# Patient Record
Sex: Male | Born: 1965 | Race: White | Hispanic: No | Marital: Married | Smoking: Former smoker
Health system: Southern US, Community
[De-identification: ages and names within clinical notes are randomized; demographics above are authoritative.]

## PROBLEM LIST (undated history)

## (undated) ENCOUNTER — Ambulatory Visit (HOSPITAL_BASED_OUTPATIENT_CLINIC_OR_DEPARTMENT_OTHER): Admission: EM | Source: Home / Self Care

## (undated) ENCOUNTER — Ambulatory Visit (HOSPITAL_BASED_OUTPATIENT_CLINIC_OR_DEPARTMENT_OTHER): Source: Home / Self Care

## (undated) DIAGNOSIS — K219 Gastro-esophageal reflux disease without esophagitis: Secondary | ICD-10-CM

## (undated) DIAGNOSIS — K269 Duodenal ulcer, unspecified as acute or chronic, without hemorrhage or perforation: Secondary | ICD-10-CM

## (undated) DIAGNOSIS — I82621 Acute embolism and thrombosis of deep veins of right upper extremity: Secondary | ICD-10-CM

## (undated) DIAGNOSIS — I10 Essential (primary) hypertension: Secondary | ICD-10-CM

## (undated) DIAGNOSIS — E785 Hyperlipidemia, unspecified: Secondary | ICD-10-CM

## (undated) HISTORY — DX: Duodenal ulcer, unspecified as acute or chronic, without hemorrhage or perforation: K26.9

## (undated) HISTORY — DX: Hyperlipidemia, unspecified: E78.5

## (undated) HISTORY — DX: Acute embolism and thrombosis of deep veins of right upper extremity: I82.621

## (undated) HISTORY — PX: OTHER SURGICAL HISTORY: SHX169

## (undated) HISTORY — DX: Gastro-esophageal reflux disease without esophagitis: K21.9

## (undated) HISTORY — DX: Essential (primary) hypertension: I10

---

## 2015-10-17 DIAGNOSIS — Z21 Asymptomatic human immunodeficiency virus [HIV] infection status: Secondary | ICD-10-CM | POA: Insufficient documentation

## 2015-10-17 HISTORY — DX: Asymptomatic human immunodeficiency virus (hiv) infection status: Z21

## 2015-10-25 DIAGNOSIS — B20 Human immunodeficiency virus [HIV] disease: Secondary | ICD-10-CM

## 2016-01-28 DIAGNOSIS — B20 Human immunodeficiency virus [HIV] disease: Secondary | ICD-10-CM | POA: Diagnosis not present

## 2016-04-03 DIAGNOSIS — B20 Human immunodeficiency virus [HIV] disease: Secondary | ICD-10-CM | POA: Diagnosis not present

## 2016-08-14 DIAGNOSIS — B20 Human immunodeficiency virus [HIV] disease: Secondary | ICD-10-CM | POA: Diagnosis not present

## 2017-08-26 DIAGNOSIS — B20 Human immunodeficiency virus [HIV] disease: Secondary | ICD-10-CM | POA: Diagnosis not present

## 2017-11-19 DIAGNOSIS — Z79899 Other long term (current) drug therapy: Secondary | ICD-10-CM | POA: Diagnosis not present

## 2017-12-07 ENCOUNTER — Other Ambulatory Visit: Payer: Self-pay | Admitting: Infectious Diseases

## 2018-05-26 DIAGNOSIS — I10 Essential (primary) hypertension: Secondary | ICD-10-CM | POA: Diagnosis not present

## 2018-05-26 DIAGNOSIS — Z79899 Other long term (current) drug therapy: Secondary | ICD-10-CM | POA: Diagnosis not present

## 2018-07-07 DIAGNOSIS — M5416 Radiculopathy, lumbar region: Secondary | ICD-10-CM | POA: Diagnosis not present

## 2018-07-07 DIAGNOSIS — M9903 Segmental and somatic dysfunction of lumbar region: Secondary | ICD-10-CM | POA: Diagnosis not present

## 2018-07-07 DIAGNOSIS — M9905 Segmental and somatic dysfunction of pelvic region: Secondary | ICD-10-CM | POA: Diagnosis not present

## 2018-07-08 DIAGNOSIS — M9905 Segmental and somatic dysfunction of pelvic region: Secondary | ICD-10-CM | POA: Diagnosis not present

## 2018-07-08 DIAGNOSIS — M5416 Radiculopathy, lumbar region: Secondary | ICD-10-CM | POA: Diagnosis not present

## 2018-07-08 DIAGNOSIS — M9903 Segmental and somatic dysfunction of lumbar region: Secondary | ICD-10-CM | POA: Diagnosis not present

## 2018-11-15 DIAGNOSIS — I10 Essential (primary) hypertension: Secondary | ICD-10-CM | POA: Diagnosis not present

## 2018-11-24 DIAGNOSIS — Z79899 Other long term (current) drug therapy: Secondary | ICD-10-CM

## 2018-11-24 DIAGNOSIS — B20 Human immunodeficiency virus [HIV] disease: Secondary | ICD-10-CM

## 2019-10-26 DIAGNOSIS — B20 Human immunodeficiency virus [HIV] disease: Secondary | ICD-10-CM

## 2020-07-09 DIAGNOSIS — E785 Hyperlipidemia, unspecified: Secondary | ICD-10-CM | POA: Insufficient documentation

## 2020-07-09 DIAGNOSIS — I10 Essential (primary) hypertension: Secondary | ICD-10-CM | POA: Insufficient documentation

## 2020-07-09 DIAGNOSIS — K219 Gastro-esophageal reflux disease without esophagitis: Secondary | ICD-10-CM | POA: Insufficient documentation

## 2020-07-09 NOTE — Progress Notes (Signed)
Cardiology Office Note:    Date:  07/10/2020   ID:  Jimmy [name], DOB 24-Apr-1966, MRN [id number]  PCP:  [name], MD  Cardiologist:  Norman Herrlich, MD   Referring MD: [name], MD  ASSESSMENT:    1. Shortness of breath   2. Essential hypertension   3. Hyperlipidemia, unspecified hyperlipidemia type   4. Chest pain of uncertain etiology    PLAN:    In order of problems listed above:  1. He presents with complaints predominantly of shortness of breath more suggestive of cardiac than pulmonary.  Elevate evaluation including proBNP level echocardiogram. 2. BP at target presently on no antihypertensive agent 3. Presently not treated 4. And increased cardiovascular risk HIV positive we will check a high-sensitivity troponin in the office today and expedite a cardiac CTA.  Next appointment 4 weeks   Medication Adjustments/Labs and Tests Ordered: Current medicines are reviewed at length with the patient today.  Concerns regarding medicines are outlined above.  No orders of the defined types were placed in this encounter.  No orders of the defined types were placed in this encounter.    Chief Complaint  Patient presents with  . Shortness of Breath  . Chest Pain    History of Present Illness:    Jimmy [name] is a 54 y.o. male who is being seen today for the evaluation of SOB at the request of [name], MD.  He had an EKG performed 06/21/2020 showing sinus rhythm first-degree heart block otherwise normal I independently reviewed  He has been HIV positive approximately 15 years.  He smoked for about 10 years 1 pack a day and stopped 10 years ago.  He has no history of lung disease no cough wheezing or sputum production.  He has no history of heart disease congenital rheumatic and had no murmurs a young man.  His risk factors for CAD include hyperlipidemia recent lipid profile showed cholesterol 210 LDL 146 triglycerides 128 HDL 38.  He has been short of  breath for 1 to 2 years but it is worsened and interferes with his life his exertional he has trouble walking through parking lots climbing stairs but also has orthopnea propping his head up at nighttime he has had swelling intermittently not today.  His family history is noteworthy for his mother having arrhythmia what sounds like valvular heart disease and a striking history of sudden cardiac death in his mother's family.  He also has been having chest pain that he calls just a nagging discomfort he says is there most of the time not particularly exertional not relieved with rest just a nagging discomfort in his left chest he is worried that he has CAD.  Its not severe and does not radiate no associated nausea vomiting or diaphoresis its not pleuritic in nature.  He has no history of chest trauma.  We discussed evaluation to decide if this is cardiac or pulmonary and I think a proBNP level be very helpful.  Echocardiogram regarding cardiomyopathy heart failure pulmonary artery hypertension.  He is concerned he has CAD he knows a friend who had a normal stress test and subsequent myocardial infarction I told him I share his concern and will do an expedited cardiac CTA.  I will see back in my office in 4 weeks or sooner and also draw a high-sensitivity troponin recheck his office EKG today. Past Medical History:  Diagnosis Date  . Acid reflux   . HIV positive (HCC) 10/17/2015  . Hyperlipidemia   .  Hypertension     Past Surgical History:  Procedure Laterality Date  . 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 BIKTARVY 50-200-25 MG TABS tablet Take 1 tablet by mouth daily.  . Omega-3 Fatty Acids (FISH OIL PO) Take 1 tablet by mouth daily.     Allergies:   Penicillins   Social History   Socioeconomic History  . Marital status: Married    Spouse name: Not on file  . Number of children: Not on file  . Years of  education: Not on file  . Highest education level: Not on file  Occupational History  . Not on file  Tobacco Use  . Smoking status: Former Smoker    Types: Cigarettes    Quit date: 10/2010    Years since quitting: 9.7  . Smokeless tobacco: Never Used  Vaping Use  . Vaping Use: Never used  Substance and Sexual Activity  . Alcohol use: Yes    Alcohol/week: 1.0 standard drink    Types: 1 Cans of beer per week    Comment: occasional beer with dinner  . Drug use: Never  . Sexual activity: Not on file  Other Topics Concern  . Not on file  Social History Narrative  . Not on file   Social Determinants of Health   Financial Resource Strain:   . Difficulty of Paying Living Expenses: Not on file  Food Insecurity:   . Worried About Programme researcher, broadcasting/film/video in the Last Year: Not on file  . Ran Out of Food in the Last Year: Not on file  Transportation Needs:   . Lack of Transportation (Medical): Not on file  . Lack of Transportation (Non-Medical): Not on file  Physical Activity:   . Days of Exercise per Week: Not on file  . Minutes of Exercise per Session: Not on file  Stress:   . Feeling of Stress : Not on file  Social Connections:   . Frequency of Communication with Friends and Family: Not on file  . Frequency of Social Gatherings with Friends and Family: Not on file  . Attends Religious Services: Not on file  . Active Member of Clubs or Organizations: Not on file  . Attends Banker Meetings: Not on file  . Marital Status: Not on file     Family History: The patient's family history includes Arrhythmia in his mother; Breast cancer in his sister; Heart attack in his maternal grandfather; Heart disease in his maternal grandfather; Liver cancer in his sister; Lung cancer in his mother.  ROS:   Review of Systems  Constitutional: Positive for malaise/fatigue.  HENT: Negative.   Eyes: Negative.   Cardiovascular: Positive for dyspnea on exertion and leg swelling.    Respiratory: Positive for shortness of breath.   Endocrine: Negative.   Hematologic/Lymphatic: Negative.   Skin: Negative.   Musculoskeletal: Positive for joint pain.  Gastrointestinal: Negative.   Genitourinary: Negative.   Neurological: Negative.   Psychiatric/Behavioral: Negative.    Please see the history of present illness.     All other systems reviewed and are negative.  EKGs/Labs/Other Studies Reviewed:    The following studies were reviewed today:   EKG:  EKG is  ordered today.  The ekg ordered today is personally reviewed and demonstrates sinus rhythm left axis deviation otherwise normal no ischemic changes   Physical Exam:    VS:  BP 126/74  Pulse 86   Ht 5\' 11"  (1.803 m)   Wt 208 lb 12.8 oz (94.7 kg)   SpO2 97%   BMI 29.12 kg/m     Wt Readings from Last 3 Encounters:  07/10/20 208 lb 12.8 oz (94.7 kg)     GEN:  Well nourished, well developed in no acute distress HEENT: Normal NECK: No JVD; No carotid bruits LYMPHATICS: No lymphadenopathy CARDIAC: RRR, no murmurs, rubs, gallops RESPIRATORY:  Clear to auscultation without rales, wheezing or rhonchi  ABDOMEN: Soft, non-tender, non-distended MUSCULOSKELETAL:  No edema; No deformity  SKIN: Warm and dry NEUROLOGIC:  Alert and oriented x 3 PSYCHIATRIC:  Normal affect     Signed, 07/12/20, MD  07/10/2020 4:18 PM    [facility]

## 2020-07-10 ENCOUNTER — Encounter: Payer: Self-pay | Admitting: Cardiology

## 2020-07-10 ENCOUNTER — Ambulatory Visit: Payer: 59 | Admitting: Cardiology

## 2020-07-10 ENCOUNTER — Other Ambulatory Visit: Payer: Self-pay

## 2020-07-10 VITALS — BP 126/74 | HR 86 | Ht 71.0 in | Wt 208.8 lb

## 2020-07-10 DIAGNOSIS — E785 Hyperlipidemia, unspecified: Secondary | ICD-10-CM | POA: Diagnosis not present

## 2020-07-10 DIAGNOSIS — I10 Essential (primary) hypertension: Secondary | ICD-10-CM

## 2020-07-10 DIAGNOSIS — R079 Chest pain, unspecified: Secondary | ICD-10-CM | POA: Diagnosis not present

## 2020-07-10 DIAGNOSIS — R0602 Shortness of breath: Secondary | ICD-10-CM | POA: Diagnosis not present

## 2020-07-10 MED ORDER — METOPROLOL TARTRATE 100 MG PO TABS: 100.0000 mg | ORAL_TABLET | Freq: Once | ORAL | 0 refills | Status: DC

## 2020-07-10 NOTE — Patient Instructions (Signed)
Medication Instructions:  Your physician recommends that you continue on your current medications as directed. Please refer to the Current Medication list given to you today.  *If you need a refill on your cardiac medications before your next appointment, please call your pharmacy*   Lab Work: Your physician recommends that you return for lab work in: TODAY CBC, BMP, ProBNP If you have labs (blood work) drawn today and your tests are completely normal, you will receive your results only by: Marland Kitchen MyChart Message (if you have MyChart) OR . A paper copy in the mail If you have any lab test that is abnormal or we need to change your treatment, we will call you to review the results.   Testing/Procedures: Your physician has requested that you have an echocardiogram. Echocardiography is a painless test that uses sound waves to create images of your heart. It provides your doctor with information about the size and shape of your heart and how well your heart's chambers and valves are working. This procedure takes approximately one hour. There are no restrictions for this procedure.  Your cardiac CT will be scheduled at the below location:   [facility] [address] [place], [place] [address] [phone]   If scheduled at [facility], please arrive at the [facility] main entrance of [facility] 30 minutes prior to test start time. Proceed to the [facility] Radiology Department (first floor) to check-in and test prep.  Please follow these instructions carefully (unless otherwise directed):  Hold all erectile dysfunction medications at least 3 days (72 hrs) prior to test.  On the Night Before the Test: . Be sure to Drink plenty of water. . Do not consume any caffeinated/decaffeinated beverages or chocolate 12 hours prior to your test. . Do not take any antihistamines 12 hours prior to your test.  On the Day of the Test: . Drink plenty of water. Do not drink  any water within one hour of the test. . Do not eat any food 4 hours prior to the test. . You may take your regular medications prior to the test.  . Take metoprolol (Lopressor) two hours prior to test.        After the Test: . Drink plenty of water. . After receiving IV contrast, you may experience a mild flushed feeling. This is normal. . On occasion, you may experience a mild rash up to 24 hours after the test. This is not dangerous. If this occurs, you can take Benadryl 25 mg and increase your fluid intake. . If you experience trouble breathing, this can be serious. If it is severe call 911 IMMEDIATELY. If it is mild, please call our office. . If you take any of these medications: Glipizide/Metformin, Avandament, Glucavance, please do not take 48 hours after completing test unless otherwise instructed.   Once we have confirmed authorization from your insurance company, we will call you to set up a date and time for your test. Based on how quickly your insurance processes prior authorizations requests, please allow up to 4 weeks to be contacted for scheduling your Cardiac CT appointment. Be advised that routine Cardiac CT appointments could be scheduled as many as 8 weeks after your provider has ordered it.  For non-scheduling related questions, please contact the cardiac imaging nurse navigator should you have any questions/concerns: Marchia Bond, Cardiac Imaging Nurse Navigator Burley Saver, Interim Cardiac Imaging Nurse [place] [facility] Direct Office Dial: [phone]  For scheduling needs, including cancellations and rescheduling, please call Vivien Rota at [phone], option 3.      Follow-Up: At [facility], you and your health needs are our priority.  As part of our continuing mission to provide you with exceptional heart care, we have created designated Provider Care Teams.  These Care Teams include your primary Cardiologist (physician) and  Advanced Practice Providers (APPs -  Physician Assistants and Nurse Practitioners) who all work together to provide you with the care you need, when you need it.  We recommend signing up for the patient portal called "MyChart".  Sign up information is provided on this After Visit Summary.  MyChart is used to connect with patients for Virtual Visits (Telemedicine).  Patients are able to view lab/test results, encounter notes, upcoming appointments, etc.  Non-urgent messages can be sent to your provider as well.   To learn more about what you can do with MyChart, go to [email or web address].    Your next appointment:   4 week(s)  The format for your next appointment:   In Person  Provider:   Shirlee More, MD   Other Instructions

## 2020-07-11 LAB — CBC
Hematocrit: 45.8 % (ref 37.5–51.0)
Hemoglobin: 15.5 g/dL (ref 13.0–17.7)
MCH: 31.5 pg (ref 26.6–33.0)
MCHC: 33.8 g/dL (ref 31.5–35.7)
MCV: 93 fL (ref 79–97)
Platelets: 362 10*3/uL (ref 150–450)
RBC: 4.92 x10E6/uL (ref 4.14–5.80)
RDW: 12.9 % (ref 11.6–15.4)
WBC: 7.6 10*3/uL (ref 3.4–10.8)

## 2020-07-11 LAB — BASIC METABOLIC PANEL
BUN/Creatinine Ratio: 13 (ref 9–20)
BUN: 14 mg/dL (ref 6–24)
CO2: 24 mmol/L (ref 20–29)
Calcium: 9.8 mg/dL (ref 8.7–10.2)
Chloride: 102 mmol/L (ref 96–106)
Creatinine, Ser: 1.05 mg/dL (ref 0.76–1.27)
GFR calc Af Amer: 93 mL/min/{1.73_m2} (ref >59)
GFR calc non Af Amer: 81 mL/min/{1.73_m2} (ref >59)
Glucose: 79 mg/dL (ref 65–99)
Potassium: 4.2 mmol/L (ref 3.5–5.2)
Sodium: 140 mmol/L (ref 134–144)

## 2020-07-11 LAB — PRO B NATRIURETIC PEPTIDE: NT-Pro BNP: 25 pg/mL (ref 0–121)

## 2020-07-11 LAB — TROPONIN T: Troponin T (Highly Sensitive): <6 ng/L (ref 0–22)

## 2020-07-12 ENCOUNTER — Telehealth: Payer: Self-pay

## 2020-07-12 ENCOUNTER — Ambulatory Visit (INDEPENDENT_AMBULATORY_CARE_PROVIDER_SITE_OTHER): Payer: 59

## 2020-07-12 ENCOUNTER — Other Ambulatory Visit: Payer: Self-pay

## 2020-07-12 DIAGNOSIS — I10 Essential (primary) hypertension: Secondary | ICD-10-CM | POA: Diagnosis not present

## 2020-07-12 DIAGNOSIS — R079 Chest pain, unspecified: Secondary | ICD-10-CM | POA: Diagnosis not present

## 2020-07-12 DIAGNOSIS — R0602 Shortness of breath: Secondary | ICD-10-CM

## 2020-07-12 DIAGNOSIS — E785 Hyperlipidemia, unspecified: Secondary | ICD-10-CM

## 2020-07-12 LAB — ECHOCARDIOGRAM COMPLETE
Area-P 1/2: 4.6 cm2
S' Lateral: 2.75 cm

## 2020-07-12 NOTE — Telephone Encounter (Signed)
Spoke with patient regarding results and recommendation.  Patient verbalizes understanding and is agreeable to plan of care. Advised patient to call back with any issues or concerns.  

## 2020-07-12 NOTE — Telephone Encounter (Signed)
-----   Message from Baldo Daub, MD sent at 07/12/2020  8:00 AM EDT ----- Good results are normal including the test for heart failure and heart attack and he is pending cardiac CTA and echo.

## 2020-07-12 NOTE — Progress Notes (Signed)
Complete echocardiogram performed.  Jimmy Rook Maue RDCS, RVT  

## 2020-07-15 ENCOUNTER — Telehealth: Payer: Self-pay

## 2020-07-15 NOTE — Telephone Encounter (Signed)
Spoke with patient regarding results and recommendation.  Patient verbalizes understanding and is agreeable to plan of care. Advised patient to call back with any issues or concerns.  

## 2020-07-15 NOTE — Telephone Encounter (Signed)
-----   Message from Baldo Daub, MD sent at 07/12/2020  4:50 PM EDT ----- This is normal no findings of congestive heart failure or heart valve problem.

## 2020-07-16 ENCOUNTER — Telehealth (HOSPITAL_COMMUNITY): Payer: Self-pay | Admitting: *Deleted

## 2020-07-16 NOTE — Telephone Encounter (Signed)

## 2020-07-17 ENCOUNTER — Ambulatory Visit (HOSPITAL_COMMUNITY)
Admission: RE | Admit: 2020-07-17 | Discharge: 2020-07-17 | Disposition: A | Discharge status: Home / Self Care | Payer: 59 | Source: Ambulatory Visit | Attending: Cardiology | Admitting: Cardiology

## 2020-07-17 ENCOUNTER — Other Ambulatory Visit: Payer: Self-pay

## 2020-07-17 DIAGNOSIS — E785 Hyperlipidemia, unspecified: Secondary | ICD-10-CM | POA: Diagnosis present

## 2020-07-17 DIAGNOSIS — R0602 Shortness of breath: Secondary | ICD-10-CM

## 2020-07-17 DIAGNOSIS — I2510 Atherosclerotic heart disease of native coronary artery without angina pectoris: Secondary | ICD-10-CM

## 2020-07-17 DIAGNOSIS — I10 Essential (primary) hypertension: Secondary | ICD-10-CM | POA: Insufficient documentation

## 2020-07-17 DIAGNOSIS — R079 Chest pain, unspecified: Secondary | ICD-10-CM

## 2020-07-17 MED ORDER — NITROGLYCERIN 0.4 MG SL SUBL: 0.8000 mg | SUBLINGUAL_TABLET | Freq: Once | SUBLINGUAL | Status: AC

## 2020-07-17 MED ORDER — IOHEXOL 350 MG/ML SOLN
80.0000 mL | Freq: Once | INTRAVENOUS | Status: AC | PRN
  Administered 2020-07-17: 80 mL via INTRAVENOUS

## 2020-07-17 MED ORDER — NITROGLYCERIN 0.4 MG SL SUBL
SUBLINGUAL_TABLET | SUBLINGUAL | Status: AC
  Administered 2020-07-17: 0.8 mg via SUBLINGUAL
  Filled 2020-07-17: qty 2

## 2020-07-18 ENCOUNTER — Telehealth: Payer: Self-pay

## 2020-07-18 ENCOUNTER — Ambulatory Visit (HOSPITAL_COMMUNITY)
Admission: RE | Admit: 2020-07-18 | Discharge: 2020-07-18 | Disposition: A | Discharge status: Home / Self Care | Payer: 59 | Source: Ambulatory Visit | Attending: Cardiology | Admitting: Cardiology

## 2020-07-18 DIAGNOSIS — E785 Hyperlipidemia, unspecified: Secondary | ICD-10-CM

## 2020-07-18 DIAGNOSIS — I2510 Atherosclerotic heart disease of native coronary artery without angina pectoris: Secondary | ICD-10-CM | POA: Diagnosis not present

## 2020-07-18 DIAGNOSIS — I10 Essential (primary) hypertension: Secondary | ICD-10-CM | POA: Diagnosis present

## 2020-07-18 DIAGNOSIS — R079 Chest pain, unspecified: Secondary | ICD-10-CM | POA: Diagnosis present

## 2020-07-18 DIAGNOSIS — R0602 Shortness of breath: Secondary | ICD-10-CM | POA: Diagnosis not present

## 2020-07-18 MED ORDER — ASPIRIN EC 81 MG PO TBEC: 81.0000 mg | DELAYED_RELEASE_TABLET | Freq: Every day | ORAL | 11 refills | Status: DC

## 2020-07-18 MED ORDER — ROSUVASTATIN CALCIUM 20 MG PO TABS: 20.0000 mg | ORAL_TABLET | Freq: Every day | ORAL | 3 refills | Status: DC

## 2020-07-18 MED ORDER — METOPROLOL SUCCINATE ER 25 MG PO TB24: 25.0000 mg | ORAL_TABLET | Freq: Every day | ORAL | 3 refills | Status: DC

## 2020-07-18 NOTE — Telephone Encounter (Signed)
Spoke with patient regarding results and recommendation.  Patient verbalizes understanding and is agreeable to plan of care. Advised patient to call back with any issues or concerns.  

## 2020-07-18 NOTE — Telephone Encounter (Signed)
-----   Message from Baldo Daub, MD sent at 07/18/2020  2:11 PM EDT ----- This was a good test for him to give [place] the answers were looking for.  He is score is very high and he needs to be on cholesterol medication as it slows the process I would like to start him on rosuvastatin 20 mg daily.  We both thought that he had blocked arteries the degree does not appear to be severe we should put him on a beta-blocker Toprol-XL 25 mg daily and he should take aspirin 81 mg/day  There is one more part of the test for flow in the left anterior descending coronary artery that has a moderate narrowing will be measured and will tell me for certain whether or not he is a severe blockage that would need something like a stent and it should be back in the next 3 to 5 days.

## 2020-07-19 ENCOUNTER — Telehealth: Payer: Self-pay

## 2020-07-19 NOTE — Telephone Encounter (Signed)
-----   Message from Baldo Daub, MD sent at 07/19/2020  9:54 AM EDT ----- I reviewed the CTA with my partners last evening.  At this time I think we should stop start with medications we started yesterday and when I see him back in the office the 2 of [place] can review the report reassess his response but at this time I be hesitant advise him to undergo procedure like stent

## 2020-07-19 NOTE — Telephone Encounter (Signed)
Spoke with patient regarding results and recommendation.  Patient verbalizes understanding and is agreeable to plan of care. Advised patient to call back with any issues or concerns.  

## 2020-07-24 ENCOUNTER — Telehealth: Payer: Self-pay | Admitting: Cardiology

## 2020-07-24 NOTE — Telephone Encounter (Signed)
Pt c/o medication issue:  1. Name of Medication: metoprolol succinate (TOPROL XL) 25 MG 24 hr tablet  2. How are you currently taking this medication (dosage and times per day)? Stopped taking  3. Are you having a reaction (difficulty breathing--STAT)? yes   4. What is your medication issue? Patient's wife states that this medication was making her husbands heart flutter. It was starting to scare him so he has stopped taking them. She wants to know where to go from here, does he need to come in? Does a new medication need to be prescribed? Please advise.

## 2020-07-24 NOTE — Telephone Encounter (Signed)
Spoke to the patient just now and let him know that Dr. Dulce Sellar recommends that the patient be seen in the office by Dr. Servando Salina and that a zio monitor will more than likely need to be ordered at this time. He verbalizes understanding and is scheduled to see Dr. Servando Salina tomorrow at 4pm.   Encouraged patient to call back with any questions or concerns.

## 2020-07-24 NOTE — Telephone Encounter (Signed)
Needs Zio new problem can DrT see him this week?

## 2020-07-25 ENCOUNTER — Other Ambulatory Visit: Payer: Self-pay

## 2020-07-25 ENCOUNTER — Encounter: Payer: Self-pay | Admitting: Cardiology

## 2020-07-25 ENCOUNTER — Ambulatory Visit: Payer: 59 | Admitting: Cardiology

## 2020-07-25 VITALS — BP 130/72 | HR 79 | Ht 71.0 in | Wt 206.6 lb

## 2020-07-25 DIAGNOSIS — I10 Essential (primary) hypertension: Secondary | ICD-10-CM

## 2020-07-25 DIAGNOSIS — R002 Palpitations: Secondary | ICD-10-CM

## 2020-07-25 DIAGNOSIS — I2510 Atherosclerotic heart disease of native coronary artery without angina pectoris: Secondary | ICD-10-CM | POA: Diagnosis not present

## 2020-07-25 DIAGNOSIS — E782 Mixed hyperlipidemia: Secondary | ICD-10-CM | POA: Diagnosis not present

## 2020-07-25 NOTE — Patient Instructions (Signed)
Medication Instructions:  Your physician recommends that you continue on your current medications as directed. Please refer to the Current Medication list given to you today.  *If you need a refill on your cardiac medications before your next appointment, please call your pharmacy*   Lab Work: Your physician recommends that you return for lab work in: TODAY TSH If you have labs (blood work) drawn today and your tests are completely normal, you will receive your results only by: Marland Kitchen MyChart Message (if you have MyChart) OR . A paper copy in the mail If you have any lab test that is abnormal or we need to change your treatment, we will call you to review the results.   Testing/Procedures: None   Follow-Up: At [facility], you and your health needs are our priority.  As part of our continuing mission to provide you with exceptional heart care, we have created designated Provider Care Teams.  These Care Teams include your primary Cardiologist (physician) and Advanced Practice Providers (APPs -  Physician Assistants and Nurse Practitioners) who all work together to provide you with the care you need, when you need it.  We recommend signing up for the patient portal called "MyChart".  Sign up information is provided on this After Visit Summary.  MyChart is used to connect with patients for Virtual Visits (Telemedicine).  Patients are able to view lab/test results, encounter notes, upcoming appointments, etc.  Non-urgent messages can be sent to your provider as well.   To learn more about what you can do with MyChart, go to [email or web address].    Your next appointment:   6 week(s)  The format for your next appointment:   In Person  Provider:   Dr. Dulce Sellar   Other Instructions

## 2020-07-25 NOTE — Progress Notes (Signed)
Cardiology Office Note:    Date:  07/25/2020   ID:  Jimmy [name], DOB Mar 28, 1966, MRN [id number]  PCP:  [name], MD  Cardiologist:  No primary care provider on file.  Electrophysiologist:  None   Referring MD: [name], MD   " I am doing fine"  History of Present Illness:    Jimmy [name] is a 54 y.o. male with a hx of HIV, hypertension, hyperlipidemia presents today to be evaluated for intermittent palpitations.  Patient tells me that 1 day this week he started experience a brief flutter this only lasted for few minutes and stopped and has not recurred.  He says he has never had this experience before.  He was seen by Dr. Dulce Sellar on July 10, 2020 at which time he complained of chest pain he was sent for coronary CTA.  Plan of chest shortness of breath at that time an echocardiogram was ordered proBNP was ordered as well.  In the interim he had troponin T which was within normal limits, his echocardiogram did not show any structural abnormalities with evidence of diastolic dysfunction.  His EF was 60 to 65%.  His cardiac CTA calcium score 516 with moderate coronary artery disease this was sent for FFR which was not impressive.  He had a distal flow of 0.76 in the distal PDA which was also reported to be a small caliber vessel.  Past Medical History:  Diagnosis Date  . Acid reflux   . HIV positive (HCC) 10/17/2015  . Hyperlipidemia   . Hypertension     Past Surgical History:  Procedure Laterality Date  . 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 aspirin EC 81 MG tablet Take 1 tablet (81 mg total) by mouth daily. Swallow whole.  Marland Kitchen BIKTARVY 50-200-25 MG TABS tablet Take 1 tablet by mouth daily.  . Omega-3 Fatty Acids (FISH OIL PO) Take 1 tablet by mouth daily.  . rosuvastatin (CRESTOR) 20 MG tablet Take 1 tablet (20 mg total) by mouth daily.     Allergies:   Penicillins   Social History    Socioeconomic History  . Marital status: Married    Spouse name: Not on file  . Number of children: Not on file  . Years of education: Not on file  . Highest education level: Not on file  Occupational History  . Not on file  Tobacco Use  . Smoking status: Former Smoker    Types: Cigarettes    Quit date: 10/2010    Years since quitting: 9.7  . Smokeless tobacco: Never Used  Vaping Use  . Vaping Use: Never used  Substance and Sexual Activity  . Alcohol use: Yes    Alcohol/week: 1.0 standard drink    Types: 1 Cans of beer per week    Comment: occasional beer with dinner  . Drug use: Never  . Sexual activity: Not on file  Other Topics Concern  . Not on file  Social History Narrative  . Not on file   Social Determinants of Health   Financial Resource Strain:   . Difficulty of Paying Living Expenses: Not on file  Food Insecurity:   . Worried About Programme researcher, broadcasting/film/video in the Last Year: Not on file  . Ran Out of Food in the Last Year: Not on file  Transportation Needs:   . Lack of Transportation (Medical): Not on file  .  Lack of Transportation (Non-Medical): Not on file  Physical Activity:   . Days of Exercise per Week: Not on file  . Minutes of Exercise per Session: Not on file  Stress:   . Feeling of Stress : Not on file  Social Connections:   . Frequency of Communication with Friends and Family: Not on file  . Frequency of Social Gatherings with Friends and Family: Not on file  . Attends Religious Services: Not on file  . Active Member of Clubs or Organizations: Not on file  . Attends Banker Meetings: Not on file  . Marital Status: Not on file     Family History: The patient's family history includes Arrhythmia in his mother; Breast cancer in his sister; Heart attack in his maternal grandfather; Heart disease in his maternal grandfather; Liver cancer in his sister; Lung cancer in his mother.  ROS:   Review of Systems  Constitution: Negative for  decreased appetite, fever and weight gain.  HENT: Negative for congestion, ear discharge, hoarse voice and sore throat.   Eyes: Negative for discharge, redness, vision loss in right eye and visual halos.  Cardiovascular: Negative for chest pain, dyspnea on exertion, leg swelling, orthopnea and palpitations.  Respiratory: Negative for cough, hemoptysis, shortness of breath and snoring.   Endocrine: Negative for heat intolerance and polyphagia.  Hematologic/Lymphatic: Negative for bleeding problem. Does not bruise/bleed easily.  Skin: Negative for flushing, nail changes, rash and suspicious lesions.  Musculoskeletal: Negative for arthritis, joint pain, muscle cramps, myalgias, neck pain and stiffness.  Gastrointestinal: Negative for abdominal pain, bowel incontinence, diarrhea and excessive appetite.  Genitourinary: Negative for decreased libido, genital sores and incomplete emptying.  Neurological: Negative for brief paralysis, focal weakness, headaches and loss of balance.  Psychiatric/Behavioral: Negative for altered mental status, depression and suicidal ideas.  Allergic/Immunologic: Negative for HIV exposure and persistent infections.    EKGs/Labs/Other Studies Reviewed:    The following studies were reviewed today:   EKG:  The ekg ordered today demonstrates   Recent Labs: 07/10/2020: BUN 14; Creatinine, Ser 1.05; Hemoglobin 15.5; NT-Pro BNP 25; Platelets 362; Potassium 4.2; Sodium 140  Recent Lipid Panel No results found for: CHOL, TRIG, HDL, CHOLHDL, VLDL, LDLCALC, LDLDIRECT  Physical Exam:    VS:  BP 130/72 (BP Location: Right Arm, Patient Position: Sitting, Cuff Size: Normal)   Pulse 79   Ht 5\' 11"  (1.803 m)   Wt 206 lb 9.6 oz (93.7 kg)   SpO2 97%   BMI 28.81 kg/m     Wt Readings from Last 3 Encounters:  07/25/20 206 lb 9.6 oz (93.7 kg)  07/10/20 208 lb 12.8 oz (94.7 kg)     GEN: Well nourished, well developed in no acute distress HEENT: Normal NECK: No JVD; No  carotid bruits LYMPHATICS: No lymphadenopathy CARDIAC: S1S2 noted,RRR, no murmurs, rubs, gallops RESPIRATORY:  Clear to auscultation without rales, wheezing or rhonchi  ABDOMEN: Soft, non-tender, non-distended, +bowel sounds, no guarding. EXTREMITIES: No edema, No cyanosis, no clubbing MUSCULOSKELETAL:  No deformity  SKIN: Warm and dry NEUROLOGIC:  Alert and oriented x 3, non-focal PSYCHIATRIC:  Normal affect, good insight  ASSESSMENT:    1. Palpitations   2. Coronary artery disease involving native coronary artery of native heart without angina pectoris   3. Essential hypertension   4. Mixed hyperlipidemia    PLAN:     He tells me the palpitation has resolved and this was a one-time occurrence.  He had been ordered metoprolol which asked the patient  to go ahead and take this medication as prescribed.  In addition we will talk about his new diagnosis of coronary artery disease and what other things to expect as well as continue with his medication regimen.  He was recently started on Crestor 20 mg daily.  LDL on his recent lab on June 20, 2020 was 125.  Now with his coronary artery disease his target goal is less than 70.  Since he just started his Crestor we can repeat his blood work in 8 weeks and should this target be greater than 70 for the LDL will optimize his statin as well as possible add Zetia.  He will follow up with Dr. Dulce Sellar.  The patient is in agreement with the above plan. The patient left the office in stable condition.  The patient will follow up in   Medication Adjustments/Labs and Tests Ordered: Current medicines are reviewed at length with the patient today.  Concerns regarding medicines are outlined above.  Orders Placed This Encounter  Procedures  . TSH   No orders of the defined types were placed in this encounter.   Patient Instructions  Medication Instructions:  Your physician recommends that you continue on your current medications as directed. Please  refer to the Current Medication list given to you today.  *If you need a refill on your cardiac medications before your next appointment, please call your pharmacy*   Lab Work: Your physician recommends that you return for lab work in: TODAY TSH If you have labs (blood work) drawn today and your tests are completely normal, you will receive your results only by: Marland Kitchen MyChart Message (if you have MyChart) OR . A paper copy in the mail If you have any lab test that is abnormal or we need to change your treatment, we will call you to review the results.   Testing/Procedures: None   Follow-Up: At [facility], you and your health needs are our priority.  As part of our continuing mission to provide you with exceptional heart care, we have created designated Provider Care Teams.  These Care Teams include your primary Cardiologist (physician) and Advanced Practice Providers (APPs -  Physician Assistants and Nurse Practitioners) who all work together to provide you with the care you need, when you need it.  We recommend signing up for the patient portal called "MyChart".  Sign up information is provided on this After Visit Summary.  MyChart is used to connect with patients for Virtual Visits (Telemedicine).  Patients are able to view lab/test results, encounter notes, upcoming appointments, etc.  Non-urgent messages can be sent to your provider as well.   To learn more about what you can do with MyChart, go to [email or web address].    Your next appointment:   6 week(s)  The format for your next appointment:   In Person  Provider:   Dr. Dulce Sellar   Other Instructions      Adopting a Healthy Lifestyle.  Know what a healthy weight is for you (roughly BMI <25) and aim to maintain this   Aim for 7+ servings of fruits and vegetables daily   65-80+ fluid ounces of water or unsweet tea for healthy kidneys   Limit to max 1 drink of alcohol per day; avoid smoking/tobacco   Limit animal  fats in diet for cholesterol and heart health - choose grass fed whenever available   Avoid highly processed foods, and foods high in saturated/trans fats   Aim for low stress - take time to  unwind and care for your mental health   Aim for 150 min of moderate intensity exercise weekly for heart health, and weights twice weekly for bone health   Aim for 7-9 hours of sleep daily   When it comes to diets, agreement about the perfect plan isnt easy to find, even among the experts. Experts at the [employer] developed an idea known as the Healthy Eating Plate. Just imagine a plate divided into logical, healthy portions.   The emphasis is on diet quality:   Load up on vegetables and fruits - one-half of your plate: Aim for color and variety, and remember that potatoes dont count.   Go for whole grains - one-quarter of your plate: Whole wheat, barley, wheat berries, quinoa, oats, brown rice, and foods made with them. If you want pasta, go with whole wheat pasta.   Protein power - one-quarter of your plate: Fish, chicken, beans, and nuts are all healthy, versatile protein sources. Limit red meat.   The diet, however, does go beyond the plate, offering a few other suggestions.   Use healthy plant oils, such as olive, canola, soy, corn, sunflower and peanut. Check the labels, and avoid partially hydrogenated oil, which have unhealthy trans fats.   If youre thirsty, drink water. Coffee and tea are good in moderation, but skip sugary drinks and limit milk and dairy products to one or two daily servings.   The type of carbohydrate in the diet is more important than the amount. Some sources of carbohydrates, such as vegetables, fruits, whole grains, and beans-are healthier than others.   Finally, stay active  Signed, Thomasene RippleKardie Anise Harbin, DO  07/25/2020 4:18 PM    [facility]

## 2020-07-26 ENCOUNTER — Telehealth: Payer: Self-pay

## 2020-07-26 LAB — TSH: TSH: 0.882 u[IU]/mL (ref 0.450–4.500)

## 2020-07-26 NOTE — Telephone Encounter (Signed)
Spoke with patient regarding results and recommendation.  Patient verbalizes understanding and is agreeable to plan of care. Advised patient to call back with any issues or concerns.  

## 2020-07-26 NOTE — Telephone Encounter (Signed)
-----   Message from Thomasene Ripple, DO sent at 07/26/2020  8:22 AM EDT ----- TSH normal

## 2020-08-07 ENCOUNTER — Ambulatory Visit: Payer: 59 | Admitting: Cardiology

## 2020-09-04 ENCOUNTER — Other Ambulatory Visit: Payer: Self-pay

## 2020-09-05 ENCOUNTER — Other Ambulatory Visit: Payer: Self-pay

## 2020-09-05 ENCOUNTER — Ambulatory Visit: Payer: 59 | Admitting: Cardiology

## 2020-09-05 ENCOUNTER — Encounter: Payer: Self-pay | Admitting: Cardiology

## 2020-09-05 VITALS — BP 127/78 | HR 66 | Ht 71.0 in | Wt 205.4 lb

## 2020-09-05 DIAGNOSIS — I10 Essential (primary) hypertension: Secondary | ICD-10-CM | POA: Diagnosis not present

## 2020-09-05 DIAGNOSIS — E782 Mixed hyperlipidemia: Secondary | ICD-10-CM

## 2020-09-05 DIAGNOSIS — I2510 Atherosclerotic heart disease of native coronary artery without angina pectoris: Secondary | ICD-10-CM | POA: Diagnosis not present

## 2020-09-05 MED ORDER — NITROGLYCERIN 0.4 MG SL SUBL: 0.4000 mg | SUBLINGUAL_TABLET | SUBLINGUAL | 3 refills | Status: DC | PRN

## 2020-09-05 NOTE — Progress Notes (Signed)
Cardiology Office Note:    Date:  09/05/2020   ID:  Jimmy [name], DOB 10/12/1966, MRN [id number]  PCP:  [name], MD  Cardiologist:  Norman Herrlich, MD    Referring MD: [name], MD    ASSESSMENT:    1. Coronary artery disease involving native coronary artery of native heart without angina pectoris   2. Essential hypertension   3. Mixed hyperlipidemia    PLAN:    In order of problems listed above:  1. Stable continue medical therapy aspirin beta-blocker high intensity statin given a prescription for nitroglycerin to use as needed. 2. BP at target continue beta-blocker 3. Continue high intensity statin check lipids goal LDL less than 55 may require combined therapy   Next appointment: 6 months   Medication Adjustments/Labs and Tests Ordered: Current medicines are reviewed at length with the patient today.  Concerns regarding medicines are outlined above.  No orders of the defined types were placed in this encounter.  No orders of the defined types were placed in this encounter.   Chief Complaint  Patient presents with  . Follow-up  . Coronary Artery Disease    History of Present Illness:    Jimmy [name] is a 54 y.o. male with a hx of elevated coronary calcium score 516 and CAD with normal FFR.  He was last seen 07/25/2020. Compliance with diet, lifestyle and medications: Yes  Compliant with his medical therapy tolerates his statin without muscle pain or weakness.  On beta-blocker has had no recurrent anginal symptoms.  I had a nice opportunity sit down review his cardiac CTA his calcium score is very high he has diffuse mild CAD but has a focal stenosis of the small branch right coronary artery not a good target for intervention.  I have asked one of my partners Dr. Herbie Baltimore to look at the film and he agreed.  I Ernie Hew give him a prescription for nitroglycerin continue his medical therapy check lipid profile is a put him on high intensity statin and plan  to see him in 6 months or sooner.  If he is having severe anginal symptoms I would refer him for coronary angiography. Past Medical History:  Diagnosis Date  . Acid reflux   . HIV positive (HCC) 10/17/2015  . Hyperlipidemia   . Hypertension     Past Surgical History:  Procedure Laterality Date  . 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 aspirin EC 81 MG tablet Take 1 tablet (81 mg total) by mouth daily. Swallow whole.  Marland Kitchen BIKTARVY 50-200-25 MG TABS tablet Take 1 tablet by mouth daily.  . metoprolol succinate (TOPROL XL) 25 MG 24 hr tablet Take 1 tablet (25 mg total) by mouth daily.  . rosuvastatin (CRESTOR) 20 MG tablet Take 1 tablet (20 mg total) by mouth daily.     Allergies:   Penicillins   Social History   Socioeconomic History  . Marital status: Married    Spouse name: Not on file  . Number of children: Not on file  . Years of education: Not on file  . Highest education level: Not on file  Occupational History  . Not on file  Tobacco Use  . Smoking status: Former Smoker    Types: Cigarettes    Quit date: 10/2010    Years since quitting: 9.8  . Smokeless tobacco: Never Used  Vaping Use  .  Vaping Use: Never used  Substance and Sexual Activity  . Alcohol use: Yes    Alcohol/week: 1.0 standard drink    Types: 1 Cans of beer per week    Comment: occasional beer with dinner  . Drug use: Never  . Sexual activity: Not on file  Other Topics Concern  . Not on file  Social History Narrative  . Not on file   Social Determinants of Health   Financial Resource Strain:   . Difficulty of Paying Living Expenses: Not on file  Food Insecurity:   . Worried About Programme researcher, broadcasting/film/video in the Last Year: Not on file  . Ran Out of Food in the Last Year: Not on file  Transportation Needs:   . Lack of Transportation (Medical): Not on file  . Lack of Transportation (Non-Medical): Not on file  Physical  Activity:   . Days of Exercise per Week: Not on file  . Minutes of Exercise per Session: Not on file  Stress:   . Feeling of Stress : Not on file  Social Connections:   . Frequency of Communication with Friends and Family: Not on file  . Frequency of Social Gatherings with Friends and Family: Not on file  . Attends Religious Services: Not on file  . Active Member of Clubs or Organizations: Not on file  . Attends Banker Meetings: Not on file  . Marital Status: Not on file     Family History: The patient's family history includes Arrhythmia in his mother; Breast cancer in his sister; Heart attack in his maternal grandfather; Heart disease in his maternal grandfather; Liver cancer in his sister; Lung cancer in his mother. ROS:   Please see the history of present illness.    All other systems reviewed and are negative.  EKGs/Labs/Other Studies Reviewed:    The following studies were reviewed today:   Recent Labs: 07/10/2020: BUN 14; Creatinine, Ser 1.05; Hemoglobin 15.5; NT-Pro BNP 25; Platelets 362; Potassium 4.2; Sodium 140 07/25/2020: TSH 0.882  Recent Lipid Panel No results found for: CHOL, TRIG, HDL, CHOLHDL, VLDL, LDLCALC, LDLDIRECT  Physical Exam:    VS:  BP 127/78   Pulse 66   Ht 5\' 11"  (1.803 m)   Wt 205 lb 6.4 oz (93.2 kg)   SpO2 98%   BMI 28.65 kg/m     Wt Readings from Last 3 Encounters:  09/05/20 205 lb 6.4 oz (93.2 kg)  07/25/20 206 lb 9.6 oz (93.7 kg)  07/10/20 208 lb 12.8 oz (94.7 kg)     GEN:  Well nourished, well developed in no acute distress HEENT: Normal NECK: No JVD; No carotid bruits LYMPHATICS: No lymphadenopathy CARDIAC: RRR, no murmurs, rubs, gallops RESPIRATORY:  Clear to auscultation without rales, wheezing or rhonchi  ABDOMEN: Soft, non-tender, non-distended MUSCULOSKELETAL:  No edema; No deformity  SKIN: Warm and dry NEUROLOGIC:  Alert and oriented x 3 PSYCHIATRIC:  Normal affect    Signed, 07/12/20, MD  09/05/2020  3:53 PM    [facility]

## 2020-09-05 NOTE — Addendum Note (Signed)
Addended by: Delorse Limber I on: 09/05/2020 03:58 PM   Modules accepted: Orders

## 2020-09-05 NOTE — Patient Instructions (Signed)
Medication Instructions:  Your physician has recommended you make the following change in your medication:  START: Nitroglycerin 0.4 mg take one tablet by mouth every 5 minutes as needed for chest pain up to three times.  *If you need a refill on your cardiac medications before your next appointment, please call your pharmacy*   Lab Work: Your physician recommends that you return for lab work in: TODAY CMP, Lipids, LPA If you have labs (blood work) drawn today and your tests are completely normal, you will receive your results only by: Marland Kitchen MyChart Message (if you have MyChart) OR . A paper copy in the mail If you have any lab test that is abnormal or we need to change your treatment, we will call you to review the results.   Testing/Procedures: None   Follow-Up: At [facility], you and your health needs are our priority.  As part of our continuing mission to provide you with exceptional heart care, we have created designated Provider Care Teams.  These Care Teams include your primary Cardiologist (physician) and Advanced Practice Providers (APPs -  Physician Assistants and Nurse Practitioners) who all work together to provide you with the care you need, when you need it.  We recommend signing up for the patient portal called "MyChart".  Sign up information is provided on this After Visit Summary.  MyChart is used to connect with patients for Virtual Visits (Telemedicine).  Patients are able to view lab/test results, encounter notes, upcoming appointments, etc.  Non-urgent messages can be sent to your provider as well.   To learn more about what you can do with MyChart, go to [email or web address].    Your next appointment:   6 month(s)  The format for your next appointment:   In Person  Provider:   Norman Herrlich, MD   Other Instructions

## 2020-09-06 ENCOUNTER — Telehealth: Payer: Self-pay

## 2020-09-06 ENCOUNTER — Telehealth: Payer: Self-pay | Admitting: Cardiology

## 2020-09-06 LAB — COMPREHENSIVE METABOLIC PANEL
ALT: 18 IU/L (ref 0–44)
AST: 16 IU/L (ref 0–40)
Albumin/Globulin Ratio: 2 (ref 1.2–2.2)
Albumin: 4.7 g/dL (ref 3.8–4.9)
Alkaline Phosphatase: 68 IU/L (ref 44–121)
BUN/Creatinine Ratio: 14 (ref 9–20)
BUN: 11 mg/dL (ref 6–24)
Bilirubin Total: 0.5 mg/dL (ref 0.0–1.2)
CO2: 26 mmol/L (ref 20–29)
Calcium: 9.7 mg/dL (ref 8.7–10.2)
Chloride: 99 mmol/L (ref 96–106)
Creatinine, Ser: 0.81 mg/dL (ref 0.76–1.27)
GFR calc Af Amer: 117 mL/min/{1.73_m2} (ref >59)
GFR calc non Af Amer: 101 mL/min/{1.73_m2} (ref >59)
Globulin, Total: 2.4 g/dL (ref 1.5–4.5)
Glucose: 81 mg/dL (ref 65–99)
Potassium: 4.4 mmol/L (ref 3.5–5.2)
Sodium: 137 mmol/L (ref 134–144)
Total Protein: 7.1 g/dL (ref 6.0–8.5)

## 2020-09-06 LAB — LIPID PANEL
Chol/HDL Ratio: 3.1 ratio (ref 0.0–5.0)
Cholesterol, Total: 105 mg/dL (ref 100–199)
HDL: 34 mg/dL — ABNORMAL LOW (ref >39)
LDL Chol Calc (NIH): 51 mg/dL (ref 0–99)
Triglycerides: 105 mg/dL (ref 0–149)
VLDL Cholesterol Cal: 20 mg/dL (ref 5–40)

## 2020-09-06 LAB — LIPOPROTEIN A (LPA): Lipoprotein (a): 12.3 nmol/L (ref <75.0)

## 2020-09-06 NOTE — Telephone Encounter (Signed)
Spoke to the patient just now and let him know that Dr. Dulce Sellar said he would prefer him to get either moderna or pfizer. He verbalizes understanding and thanks me for the call back.

## 2020-09-06 NOTE — Telephone Encounter (Signed)
-----   Message from Baldo Daub, MD sent at 09/06/2020  8:04 AM EDT ----- Good result no changes

## 2020-09-06 NOTE — Telephone Encounter (Signed)
Spoke with patient regarding results and recommendation.  Patient verbalizes understanding and is agreeable to plan of care. Advised patient to call back with any issues or concerns.  

## 2020-09-06 NOTE — Telephone Encounter (Signed)
Pt's wife wants to know which shot Dr. Dulce Sellar prefers for her husband to take. Please call back

## 2020-10-14 ENCOUNTER — Telehealth: Payer: Self-pay | Admitting: *Deleted

## 2020-10-14 DIAGNOSIS — Z79899 Other long term (current) drug therapy: Secondary | ICD-10-CM

## 2020-10-14 DIAGNOSIS — Z113 Encounter for screening for infections with a predominantly sexual mode of transmission: Secondary | ICD-10-CM

## 2020-10-14 DIAGNOSIS — B20 Human immunodeficiency virus [HIV] disease: Secondary | ICD-10-CM

## 2020-10-14 NOTE — Telephone Encounter (Signed)
Received call from patient's wife regarding 1) labs and 2) medication prescription. Labs scheduled at [facility] 11/29, which he will keep. He would like labs drawn at office visits in the future, as he is coming in from out of town. 2) patient will need prescription written for next refill, as he is changing insurance in January. Per wife, patient has been off medication for some time. Labs ordered. Andree Coss, RN

## 2020-10-15 ENCOUNTER — Other Ambulatory Visit: Payer: 59

## 2020-10-15 ENCOUNTER — Other Ambulatory Visit (HOSPITAL_COMMUNITY)
Admission: RE | Admit: 2020-10-15 | Discharge: 2020-10-15 | Disposition: A | Discharge status: Home / Self Care | Payer: 59 | Source: Ambulatory Visit | Attending: Infectious Disease | Admitting: Infectious Disease

## 2020-10-15 ENCOUNTER — Other Ambulatory Visit: Payer: Self-pay

## 2020-10-15 DIAGNOSIS — Z79899 Other long term (current) drug therapy: Secondary | ICD-10-CM

## 2020-10-15 DIAGNOSIS — Z113 Encounter for screening for infections with a predominantly sexual mode of transmission: Secondary | ICD-10-CM | POA: Insufficient documentation

## 2020-10-15 DIAGNOSIS — B20 Human immunodeficiency virus [HIV] disease: Secondary | ICD-10-CM | POA: Diagnosis not present

## 2020-10-15 LAB — URINALYSIS
Bilirubin Urine: NEGATIVE
Glucose, UA: NEGATIVE
Hgb urine dipstick: NEGATIVE
Ketones, ur: NEGATIVE
Leukocytes,Ua: NEGATIVE
Nitrite: NEGATIVE
Protein, ur: NEGATIVE
Specific Gravity, Urine: 1.016 (ref 1.001–1.03)
pH: 7 (ref 5.0–8.0)

## 2020-10-16 LAB — T-HELPER CELL (CD4) - (RCID CLINIC ONLY)
CD4 % Helper T Cell: 49 % (ref 33–65)
CD4 T Cell Abs: 629 /uL (ref 400–1790)

## 2020-10-16 LAB — URINE CYTOLOGY ANCILLARY ONLY
Chlamydia: NEGATIVE
Comment: NEGATIVE
Comment: NORMAL
Neisseria Gonorrhea: NEGATIVE

## 2020-10-19 LAB — COMPLETE METABOLIC PANEL WITH GFR
AG Ratio: 2 (calc) (ref 1.0–2.5)
ALT: 17 U/L (ref 9–46)
AST: 17 U/L (ref 10–35)
Albumin: 4.7 g/dL (ref 3.6–5.1)
Alkaline phosphatase (APISO): 53 U/L (ref 35–144)
BUN: 14 mg/dL (ref 7–25)
CO2: 29 mmol/L (ref 20–32)
Calcium: 10 mg/dL (ref 8.6–10.3)
Chloride: 101 mmol/L (ref 98–110)
Creat: 1.02 mg/dL (ref 0.70–1.33)
GFR, Est African American: 97 mL/min/{1.73_m2} (ref >=60)
GFR, Est Non African American: 84 mL/min/{1.73_m2} (ref >=60)
Globulin: 2.4 g/dL (calc) (ref 1.9–3.7)
Glucose, Bld: 94 mg/dL (ref 65–99)
Potassium: 4.8 mmol/L (ref 3.5–5.3)
Sodium: 138 mmol/L (ref 135–146)
Total Bilirubin: 0.4 mg/dL (ref 0.2–1.2)
Total Protein: 7.1 g/dL (ref 6.1–8.1)

## 2020-10-19 LAB — CBC WITH DIFFERENTIAL/PLATELET
Absolute Monocytes: 348 cells/uL (ref 200–950)
Basophils Absolute: 30 cells/uL (ref 0–200)
Basophils Relative: 0.5 %
Eosinophils Absolute: 118 cells/uL (ref 15–500)
Eosinophils Relative: 2 %
HCT: 46.1 % (ref 38.5–50.0)
Hemoglobin: 15.7 g/dL (ref 13.2–17.1)
Lymphs Abs: 1381 cells/uL (ref 850–3900)
MCH: 31.8 pg (ref 27.0–33.0)
MCHC: 34.1 g/dL (ref 32.0–36.0)
MCV: 93.3 fL (ref 80.0–100.0)
MPV: 9.9 fL (ref 7.5–12.5)
Monocytes Relative: 5.9 %
Neutro Abs: 4024 cells/uL (ref 1500–7800)
Neutrophils Relative %: 68.2 %
Platelets: 349 10*3/uL (ref 140–400)
RBC: 4.94 10*6/uL (ref 4.20–5.80)
RDW: 13 % (ref 11.0–15.0)
Total Lymphocyte: 23.4 %
WBC: 5.9 10*3/uL (ref 3.8–10.8)

## 2020-10-19 LAB — HIV-1/2 AB - DIFFERENTIATION
HIV-1 antibody: POSITIVE — AB
HIV-2 Ab: NEGATIVE

## 2020-10-19 LAB — RPR: RPR Ser Ql: NONREACTIVE

## 2020-10-19 LAB — HIV-1 RNA ULTRAQUANT REFLEX TO GENTYP+
HIV 1 RNA Quant: <20 copies/mL
HIV-1 RNA Quant, Log: <1.3 Log copies/mL

## 2020-10-19 LAB — HIV ANTIBODY (ROUTINE TESTING W REFLEX): HIV 1&2 Ab, 4th Generation: REACTIVE — AB

## 2020-10-19 LAB — QUANTIFERON-TB GOLD PLUS
Mitogen-NIL: 8.38 IU/mL
NIL: 0.09 IU/mL
QuantiFERON-TB Gold Plus: NEGATIVE
TB1-NIL: 0.16 IU/mL
TB2-NIL: 0.11 IU/mL

## 2020-10-19 LAB — HEPATITIS A ANTIBODY, TOTAL: Hepatitis A AB,Total: NONREACTIVE

## 2020-10-19 LAB — LIPID PANEL
Cholesterol: 110 mg/dL (ref <200)
HDL: 41 mg/dL (ref >=40)
LDL Cholesterol (Calc): 53 mg/dL (calc)
Non-HDL Cholesterol (Calc): 69 mg/dL (calc) (ref <130)
Total CHOL/HDL Ratio: 2.7 (calc) (ref <5.0)
Triglycerides: 84 mg/dL (ref <150)

## 2020-10-19 LAB — HEPATITIS C ANTIBODY
Hepatitis C Ab: NONREACTIVE
SIGNAL TO CUT-OFF: 0.01 (ref <1.00)

## 2020-10-19 LAB — HLA B*5701: HLA-B*5701 w/rflx HLA-B High: NEGATIVE

## 2020-10-19 LAB — HEPATITIS B SURFACE ANTIGEN: Hepatitis B Surface Ag: NONREACTIVE

## 2020-10-19 LAB — HEPATITIS B CORE ANTIBODY, TOTAL: Hep B Core Total Ab: NONREACTIVE

## 2020-10-19 LAB — HEPATITIS B SURFACE ANTIBODY,QUALITATIVE: Hep B S Ab: NONREACTIVE

## 2020-10-23 ENCOUNTER — Encounter: Payer: Self-pay | Admitting: Infectious Disease

## 2020-10-29 ENCOUNTER — Encounter: Payer: 59 | Admitting: Infectious Disease

## 2020-11-05 ENCOUNTER — Encounter: Payer: Self-pay | Admitting: Infectious Disease

## 2020-11-05 ENCOUNTER — Ambulatory Visit: Payer: 59 | Admitting: Infectious Disease

## 2020-11-05 ENCOUNTER — Other Ambulatory Visit: Payer: Self-pay

## 2020-11-05 VITALS — BP 145/78 | HR 72 | Temp 98.4°F | Wt 213.0 lb

## 2020-11-05 DIAGNOSIS — E782 Mixed hyperlipidemia: Secondary | ICD-10-CM

## 2020-11-05 DIAGNOSIS — K219 Gastro-esophageal reflux disease without esophagitis: Secondary | ICD-10-CM

## 2020-11-05 DIAGNOSIS — Z21 Asymptomatic human immunodeficiency virus [HIV] infection status: Secondary | ICD-10-CM

## 2020-11-05 DIAGNOSIS — I10 Essential (primary) hypertension: Secondary | ICD-10-CM

## 2020-11-05 MED ORDER — BIKTARVY 50-200-25 MG PO TABS
1.0000 | ORAL_TABLET | Freq: Every day | ORAL | 11 refills | Status: DC
Start: 2020-11-05 — End: 2020-11-06

## 2020-11-05 NOTE — Progress Notes (Signed)
Subjective:  Complaint follow-up for HIV disease on medications  Patient ID: Jimmy [name], male    DOB: 1966/04/22, 54 y.o.   MRN: [id number]  HPI   Ananda is a 54 year old Caucasian man living with HIV that we have managed previously at the clinic in [place].  Now that that clinic is closed he is transitioned his care here to [facility].  He is highly adherent to his Biktarvy and remains undetectable.  His insurance is changing and his wife would like the new BIKTARVY to be sent to a specific pharmacy.  They have asked for a printed copy which I gave to him.  His wife also had the name of the specific pharmacy said he was going to send [place] that information through my chart.  Noted that despite having been vaccinated with 3 doses of hepatitis B vaccine that his hepatitis B surface antibody was negative at intake here.  He would therefore qualify for the ACT G study of hepatitis B vaccines [facility].   Past Medical History:  Diagnosis Date  . Acid reflux   . HIV positive (HCC) 10/17/2015  . Hyperlipidemia   . Hypertension     Past Surgical History:  Procedure Laterality Date  . 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 Arrhythmia Mother   . Lung cancer Mother   . Breast cancer Sister   . Liver cancer Sister   . Heart disease Maternal Grandfather   . Heart attack Maternal Grandfather       Social History   Socioeconomic History  . Marital status: Married    Spouse name: Not on file  . Number of children: Not on file  . Years of education: Not on file  . Highest education level: Not on file  Occupational History  . Not on file  Tobacco Use  . Smoking status: Former Smoker    Types: Cigarettes    Quit date: 10/2010    Years since quitting: 10.0  . Smokeless tobacco: Never Used  Vaping Use  . Vaping Use: Never used  Substance and Sexual Activity  . Alcohol use: Yes    Alcohol/week: 1.0  standard drink    Types: 1 Cans of beer per week    Comment: occasional beer with dinner  . Drug use: Never  . Sexual activity: Not Currently    Comment: declined condoms  Other Topics Concern  . Not on file  Social History Narrative  . Not on file   Social Determinants of Health   Financial Resource Strain: Not on file  Food Insecurity: Not on file  Transportation Needs: Not on file  Physical Activity: Not on file  Stress: Not on file  Social Connections: Not on file    Allergies  Allergen Reactions  . Penicillins      Current Outpatient Medications:  .  aspirin EC 81 MG tablet, Take 1 tablet (81 mg total) by mouth daily. Swallow whole., Disp: 30 tablet, Rfl: 11 .  celecoxib (CELEBREX) 200 MG capsule, Take 200 mg by mouth daily., Disp: , Rfl:  .  metoprolol succinate (TOPROL XL) 25 MG 24 hr tablet, Take 1 tablet (25 mg total) by mouth daily., Disp: 90 tablet, Rfl: 3 .  nitroGLYCERIN (NITROSTAT) 0.4 MG SL tablet, Place 1 tablet (0.4 mg total) under the tongue every 5 (five) minutes as needed for chest pain., Disp: 90 tablet, Rfl:  3 .  Omega-3 Fatty Acids (FISH OIL PO), Take 1 tablet by mouth daily., Disp: , Rfl:  .  BIKTARVY 50-200-25 MG TABS tablet, Take 1 tablet by mouth daily., Disp: 30 tablet, Rfl: 11 .  metoprolol tartrate (LOPRESSOR) 100 MG tablet, Take 1 tablet (100 mg total) by mouth once for 1 dose. Take two hours prior to your cardiac CT, Disp: 1 tablet, Rfl: 0 .  rosuvastatin (CRESTOR) 20 MG tablet, Take 1 tablet (20 mg total) by mouth daily., Disp: 90 tablet, Rfl: 3   Review of Systems  Constitutional: Negative for activity change, appetite change, chills, diaphoresis, fatigue, fever and unexpected weight change.  HENT: Negative for congestion, rhinorrhea, sinus pressure, sneezing, sore throat and trouble swallowing.   Eyes: Negative for photophobia and visual disturbance.  Respiratory: Negative for cough, chest tightness, shortness of breath, wheezing and  stridor.   Cardiovascular: Negative for chest pain, palpitations and leg swelling.  Gastrointestinal: Negative for abdominal distention, abdominal pain, anal bleeding, blood in stool, constipation, diarrhea, nausea and vomiting.  Genitourinary: Negative for difficulty urinating, dysuria, flank pain and hematuria.  Musculoskeletal: Negative for arthralgias, back pain, gait problem, joint swelling and myalgias.  Skin: Negative for color change, pallor, rash and wound.  Neurological: Negative for dizziness, tremors, weakness and light-headedness.  Hematological: Negative for adenopathy. Does not bruise/bleed easily.  Psychiatric/Behavioral: Negative for agitation, behavioral problems, confusion, decreased concentration, dysphoric mood and sleep disturbance.       Objective:   Physical Exam Constitutional:      Appearance: He is well-developed and well-nourished.  HENT:     Head: Normocephalic and atraumatic.  Eyes:     Extraocular Movements: EOM normal.  Cardiovascular:     Rate and Rhythm: Normal rate and regular rhythm.  Pulmonary:     Effort: Pulmonary effort is normal. No respiratory distress.     Breath sounds: No wheezing.  Abdominal:     General: There is no distension.     Palpations: Abdomen is soft.  Musculoskeletal:        General: No tenderness or edema. Normal range of motion.     Cervical back: Normal range of motion and neck supple.  Skin:    General: Skin is warm and dry.     Coloration: Skin is not pale.     Findings: No erythema or rash.  Neurological:     General: No focal deficit present.     Mental Status: He is alert and oriented to person, place, and time.  Psychiatric:        Mood and Affect: Mood normal.        Behavior: Behavior normal.        Thought Content: Thought content normal.        Judgment: Judgment normal.           Assessment & Plan:  HIV disease: continue Biktarvy, RTC in one year  HTN: on meds  HEb immunity qualifies for  study and hopefully can enroll him

## 2020-11-06 ENCOUNTER — Other Ambulatory Visit: Payer: Self-pay

## 2020-11-06 MED ORDER — BIKTARVY 50-200-25 MG PO TABS: 1.0000 | ORAL_TABLET | Freq: Every day | ORAL | 5 refills | Status: DC

## 2020-11-12 ENCOUNTER — Other Ambulatory Visit: Payer: Self-pay

## 2020-11-12 MED ORDER — BIKTARVY 50-200-25 MG PO TABS: 1.0000 | ORAL_TABLET | Freq: Every day | ORAL | 5 refills | Status: DC

## 2020-11-27 ENCOUNTER — Encounter (INDEPENDENT_AMBULATORY_CARE_PROVIDER_SITE_OTHER): Payer: Self-pay | Admitting: Infectious Disease

## 2020-11-27 ENCOUNTER — Other Ambulatory Visit: Payer: Self-pay

## 2020-11-27 VITALS — BP 147/84 | HR 71 | Temp 98.4°F | Ht 71.0 in | Wt 218.4 lb

## 2020-11-27 DIAGNOSIS — Z21 Asymptomatic human immunodeficiency virus [HIV] infection status: Secondary | ICD-10-CM

## 2020-11-27 DIAGNOSIS — Z006 Encounter for examination for normal comparison and control in clinical research program: Secondary | ICD-10-CM

## 2020-11-27 NOTE — Progress Notes (Signed)
Subjective:  Chief complaint here for physical exam for ACTG study  Patient ID: Jimmy [name], male    DOB: 07-16-1966, 55 y.o.   MRN: [id number]  HPI    Tremane is a 55 year old Caucasian man living with HIV that we have managed previously at the clinic in [place].  Now that that clinic is closed he is transitioned his care here to [facility].  He is highly adherent to his Biktarvy and remains undetectable.  He is here for a physical exam for the ACT G study of hepatitis B vaccines BeeHIVE study.   Past Medical History:  Diagnosis Date  . Acid reflux   . HIV positive (HCC) 10/17/2015  . Hyperlipidemia   . Hypertension     Past Surgical History:  Procedure Laterality Date  . 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 Arrhythmia Mother   . Lung cancer Mother   . Breast cancer Sister   . Liver cancer Sister   . Heart disease Maternal Grandfather   . Heart attack Maternal Grandfather       Social History   Socioeconomic History  . Marital status: Married    Spouse name: Not on file  . Number of children: Not on file  . Years of education: Not on file  . Highest education level: Not on file  Occupational History  . Not on file  Tobacco Use  . Smoking status: Former Smoker    Types: Cigarettes    Quit date: 10/2010    Years since quitting: 10.1  . Smokeless tobacco: Never Used  Vaping Use  . Vaping Use: Never used  Substance and Sexual Activity  . Alcohol use: Yes    Alcohol/week: 1.0 standard drink    Types: 1 Cans of beer per week    Comment: occasional beer with dinner  . Drug use: Never  . Sexual activity: Not Currently    Comment: declined condoms  Other Topics Concern  . Not on file  Social History Narrative  . Not on file   Social Determinants of Health   Financial Resource Strain: Not on file  Food Insecurity: Not on file  Transportation Needs: Not on file  Physical  Activity: Not on file  Stress: Not on file  Social Connections: Not on file    Allergies  Allergen Reactions  . Penicillins      Current Outpatient Medications:  .  aspirin EC 81 MG tablet, Take 1 tablet (81 mg total) by mouth daily. Swallow whole., Disp: 30 tablet, Rfl: 11 .  BIKTARVY 50-200-25 MG TABS tablet, Take 1 tablet by mouth daily., Disp: 30 tablet, Rfl: 5 .  celecoxib (CELEBREX) 200 MG capsule, Take 200 mg by mouth daily., Disp: , Rfl:  .  metoprolol succinate (TOPROL XL) 25 MG 24 hr tablet, Take 1 tablet (25 mg total) by mouth daily., Disp: 90 tablet, Rfl: 3 .  metoprolol tartrate (LOPRESSOR) 100 MG tablet, Take 1 tablet (100 mg total) by mouth once for 1 dose. Take two hours prior to your cardiac CT, Disp: 1 tablet, Rfl: 0 .  nitroGLYCERIN (NITROSTAT) 0.4 MG SL tablet, Place 1 tablet (0.4 mg total) under the tongue every 5 (five) minutes as needed for chest pain., Disp: 90 tablet, Rfl: 3 .  Omega-3 Fatty Acids (FISH OIL PO), Take 1 tablet by mouth daily., Disp: , Rfl:  .  rosuvastatin (CRESTOR)  20 MG tablet, Take 1 tablet (20 mg total) by mouth daily., Disp: 90 tablet, Rfl: 3   Review of Systems  Constitutional: Negative for activity change, appetite change, chills, diaphoresis, fatigue, fever and unexpected weight change.  HENT: Negative for congestion, rhinorrhea, sinus pressure, sneezing, sore throat and trouble swallowing.   Eyes: Negative for photophobia and visual disturbance.  Respiratory: Negative for cough, chest tightness, shortness of breath, wheezing and stridor.   Cardiovascular: Negative for chest pain, palpitations and leg swelling.  Gastrointestinal: Negative for abdominal distention, abdominal pain, anal bleeding, blood in stool, constipation, diarrhea, nausea and vomiting.  Genitourinary: Negative for difficulty urinating, dysuria, flank pain and hematuria.  Musculoskeletal: Negative for arthralgias, back pain, gait problem, joint swelling and myalgias.   Skin: Negative for color change, pallor, rash and wound.  Neurological: Negative for dizziness, tremors, weakness and light-headedness.  Hematological: Negative for adenopathy. Does not bruise/bleed easily.  Psychiatric/Behavioral: Negative for agitation, behavioral problems, confusion, decreased concentration, dysphoric mood and sleep disturbance.       Objective:   Physical Exam Constitutional:      Appearance: He is well-developed.  HENT:     Head: Normocephalic and atraumatic.  Eyes:     Extraocular Movements: Extraocular movements intact.     Conjunctiva/sclera: Conjunctivae normal.  Cardiovascular:     Rate and Rhythm: Normal rate and regular rhythm.     Heart sounds: No murmur heard. No friction rub. No gallop.   Pulmonary:     Effort: Pulmonary effort is normal. No respiratory distress.     Breath sounds: No wheezing.  Abdominal:     General: Abdomen is flat. There is no distension.     Palpations: Abdomen is soft. There is no mass.     Tenderness: There is no abdominal tenderness.  Musculoskeletal:        General: No tenderness. Normal range of motion.     Cervical back: Normal range of motion and neck supple.  Skin:    General: Skin is warm and dry.     Coloration: Skin is not pale.     Findings: No erythema or rash.  Neurological:     General: No focal deficit present.     Mental Status: He is alert and oriented to person, place, and time. Mental status is at baseline.  Psychiatric:        Mood and Affect: Mood normal.        Behavior: Behavior normal.        Thought Content: Thought content normal.        Judgment: Judgment normal.           Assessment & Plan:   Normal physical exam ready to enroll into BEEHIVE

## 2020-11-27 NOTE — Research (Addendum)
Rochelle was here to screen for the BeeHive study. He had taken the consent home earlier and had time to review before this visit. Informed consent was obtained after we reviewed the consent together and answered any questions he had about it. He was previously diagnosed around 2016 and had been vaccinated for. Hep B during that first year of treatment. He does have known CAD which he is receiving treatment for. He has not had flu or covid vaccines this year, but did have covid back in the Fall and said he was very sick but did not receive care for it. He said he lost 15 lbs during that time..   We have entry planned for 2/2 as long as his labs are within study limits.

## 2020-11-28 LAB — COMPREHENSIVE METABOLIC PANEL
AG Ratio: 2 (calc) (ref 1.0–2.5)
ALT: 21 U/L (ref 9–46)
AST: 19 U/L (ref 10–35)
Albumin: 4.9 g/dL (ref 3.6–5.1)
Alkaline phosphatase (APISO): 51 U/L (ref 35–144)
BUN: 12 mg/dL (ref 7–25)
CO2: 27 mmol/L (ref 20–32)
Calcium: 9.9 mg/dL (ref 8.6–10.3)
Chloride: 102 mmol/L (ref 98–110)
Creat: 0.91 mg/dL (ref 0.70–1.33)
Globulin: 2.4 g/dL (calc) (ref 1.9–3.7)
Glucose, Bld: 98 mg/dL (ref 65–99)
Potassium: 4.3 mmol/L (ref 3.5–5.3)
Sodium: 138 mmol/L (ref 135–146)
Total Bilirubin: 0.6 mg/dL (ref 0.2–1.2)
Total Protein: 7.3 g/dL (ref 6.1–8.1)

## 2020-11-28 LAB — HEPATITIS B SURFACE ANTIBODY,QUALITATIVE: Hep B S Ab: NONREACTIVE

## 2020-11-28 LAB — BILIRUBIN, DIRECT: Bilirubin, Direct: 0.1 mg/dL (ref 0.0–0.2)

## 2020-11-28 LAB — PROTIME-INR
INR: 1.1
Prothrombin Time: 12 s — ABNORMAL HIGH (ref 9.0–11.5)

## 2020-11-28 LAB — CK: Total CK: 81 U/L (ref 44–196)

## 2020-11-28 LAB — PHOSPHORUS: Phosphorus: 3.3 mg/dL (ref 2.5–4.5)

## 2020-11-28 LAB — HEPATITIS B CORE ANTIBODY, TOTAL: Hep B Core Total Ab: NONREACTIVE

## 2020-11-28 LAB — HEPATITIS B SURFACE ANTIGEN: Hepatitis B Surface Ag: NONREACTIVE

## 2020-12-18 ENCOUNTER — Encounter (INDEPENDENT_AMBULATORY_CARE_PROVIDER_SITE_OTHER): Payer: Self-pay | Admitting: *Deleted

## 2020-12-18 ENCOUNTER — Other Ambulatory Visit: Payer: Self-pay

## 2020-12-18 VITALS — BP 146/75 | HR 76 | Temp 98.3°F | Wt 219.5 lb

## 2020-12-18 DIAGNOSIS — Z006 Encounter for examination for normal comparison and control in clinical research program: Secondary | ICD-10-CM

## 2020-12-18 NOTE — Research (Signed)
Jimmy [name] was here to enroll in the BeeHive study comparing hep B vaccines. After eligibility was confirmed he was randomized to receive Heplisav-B 3 doses. The first dose was given today without any reported side effects. He was instructed on taking his temperature every day for 7 days and documenting any side effects from the vaccine. He was given a thermometer and ruler to measure for swelling or redness at the site. He will be returning tomorrow for a large blood draw and then again in 4 weeks.

## 2020-12-19 ENCOUNTER — Encounter (INDEPENDENT_AMBULATORY_CARE_PROVIDER_SITE_OTHER): Payer: Self-pay | Admitting: *Deleted

## 2020-12-19 DIAGNOSIS — Z006 Encounter for examination for normal comparison and control in clinical research program: Secondary | ICD-10-CM

## 2020-12-19 LAB — CK: Total CK: 75 U/L (ref 44–196)

## 2020-12-19 LAB — COMPREHENSIVE METABOLIC PANEL
AG Ratio: 2 (calc) (ref 1.0–2.5)
ALT: 16 U/L (ref 9–46)
AST: 17 U/L (ref 10–35)
Albumin: 4.9 g/dL (ref 3.6–5.1)
Alkaline phosphatase (APISO): 52 U/L (ref 35–144)
BUN: 14 mg/dL (ref 7–25)
CO2: 28 mmol/L (ref 20–32)
Calcium: 10.2 mg/dL (ref 8.6–10.3)
Chloride: 102 mmol/L (ref 98–110)
Creat: 0.97 mg/dL (ref 0.70–1.33)
Globulin: 2.5 g/dL (calc) (ref 1.9–3.7)
Glucose, Bld: 93 mg/dL (ref 65–99)
Potassium: 4.8 mmol/L (ref 3.5–5.3)
Sodium: 138 mmol/L (ref 135–146)
Total Bilirubin: 0.4 mg/dL (ref 0.2–1.2)
Total Protein: 7.4 g/dL (ref 6.1–8.1)

## 2020-12-19 LAB — PHOSPHORUS: Phosphorus: 3.6 mg/dL (ref 2.5–4.5)

## 2020-12-19 LAB — CD4/CD8 (T-HELPER/T-SUPPRESSOR CELL)
CD4 % Helper T Cell: 52.3
CD4 Count: 837
CD8 % Suppressor T Cell: 21.4
CD8 T Cell Abs: 342

## 2020-12-19 LAB — BILIRUBIN, DIRECT: Bilirubin, Direct: 0.1 mg/dL (ref 0.0–0.2)

## 2020-12-19 NOTE — Research (Signed)
Jimmy [name] returned today for the large blood draw. Lab was drawn at 0840 without problem. He will return in 4 weeks.

## 2020-12-25 ENCOUNTER — Encounter: Payer: Self-pay | Admitting: Infectious Disease

## 2020-12-25 LAB — CD4/CD8 (T-HELPER/T-SUPPRESSOR CELL)
CD4 % Helper T Cell: 50.5
CD4 Count: 808
CD8 % Suppressor T Cell: 22.3
CD8 T Cell Abs: 357

## 2020-12-26 ENCOUNTER — Encounter: Payer: Self-pay | Admitting: Infectious Disease

## 2020-12-30 LAB — HIV-1 RNA QUANT-NO REFLEX-BLD: HIV1 Copies/mL: <40

## 2021-01-10 ENCOUNTER — Telehealth: Payer: Self-pay | Admitting: Cardiology

## 2021-01-10 NOTE — Telephone Encounter (Signed)
    Pt c/o medication issue:  1. Name of Medication:   celecoxib (CELEBREX) 200 MG capsule   2. How are you currently taking this medication (dosage and times per day)? Take 200 mg by mouth daily.Patient not taking: Reported on 11/27/2020  3. Are you having a reaction (difficulty breathing--STAT)?   4. What is your medication issue? Pt's wife would like to let Dr. Dulce Sellar know that the pt no longer taking this medication and requesting to be removed from his med list

## 2021-01-10 NOTE — Telephone Encounter (Signed)
Celebrex has been taken off of the patients medication list at this time.    Encouraged patient to call back with any questions or concerns.

## 2021-01-14 ENCOUNTER — Other Ambulatory Visit: Payer: Self-pay

## 2021-01-14 ENCOUNTER — Encounter (INDEPENDENT_AMBULATORY_CARE_PROVIDER_SITE_OTHER): Payer: Self-pay | Admitting: *Deleted

## 2021-01-14 VITALS — BP 122/74 | HR 73 | Temp 98.2°F | Wt 220.0 lb

## 2021-01-14 DIAGNOSIS — Z006 Encounter for examination for normal comparison and control in clinical research program: Secondary | ICD-10-CM

## 2021-01-14 NOTE — Research (Signed)
Jimmy [name] was here for his week 4 visit for the BeeHive study. He had no problems with the first injection at the last visit. He did receive the 2nd dose today without any problem. He denies any new problems or medications. He had returned his diary card completed from the last visit and was given a new one to track temperatures and side effects for the next week.he will come for his next visit on 3/28.

## 2021-01-15 LAB — HEPATIC FUNCTION PANEL
AG Ratio: 1.8 (calc) (ref 1.0–2.5)
ALT: 20 U/L (ref 9–46)
AST: 18 U/L (ref 10–35)
Albumin: 4.7 g/dL (ref 3.6–5.1)
Alkaline phosphatase (APISO): 53 U/L (ref 35–144)
Bilirubin, Direct: 0.1 mg/dL (ref 0.0–0.2)
Globulin: 2.6 g/dL (calc) (ref 1.9–3.7)
Indirect Bilirubin: 0.4 mg/dL (calc) (ref 0.2–1.2)
Total Bilirubin: 0.5 mg/dL (ref 0.2–1.2)
Total Protein: 7.3 g/dL (ref 6.1–8.1)

## 2021-01-15 LAB — CBC WITH DIFFERENTIAL/PLATELET
Absolute Monocytes: 402 cells/uL (ref 200–950)
Basophils Absolute: 48 cells/uL (ref 0–200)
Basophils Relative: 0.8 %
Eosinophils Absolute: 180 cells/uL (ref 15–500)
Eosinophils Relative: 3 %
HCT: 46.7 % (ref 38.5–50.0)
Hemoglobin: 16.4 g/dL (ref 13.2–17.1)
Lymphs Abs: 1716 cells/uL (ref 850–3900)
MCH: 32.3 pg (ref 27.0–33.0)
MCHC: 35.1 g/dL (ref 32.0–36.0)
MCV: 91.9 fL (ref 80.0–100.0)
MPV: 9.9 fL (ref 7.5–12.5)
Monocytes Relative: 6.7 %
Neutro Abs: 3654 cells/uL (ref 1500–7800)
Neutrophils Relative %: 60.9 %
Platelets: 338 10*3/uL (ref 140–400)
RBC: 5.08 10*6/uL (ref 4.20–5.80)
RDW: 12.9 % (ref 11.0–15.0)
Total Lymphocyte: 28.6 %
WBC: 6 10*3/uL (ref 3.8–10.8)

## 2021-02-10 ENCOUNTER — Encounter (INDEPENDENT_AMBULATORY_CARE_PROVIDER_SITE_OTHER): Payer: Self-pay | Admitting: *Deleted

## 2021-02-10 ENCOUNTER — Other Ambulatory Visit: Payer: Self-pay

## 2021-02-10 VITALS — BP 131/80 | HR 67 | Temp 98.2°F | Wt 221.1 lb

## 2021-02-10 DIAGNOSIS — Z006 Encounter for examination for normal comparison and control in clinical research program: Secondary | ICD-10-CM

## 2021-02-10 NOTE — Research (Signed)
Jimmy [name] was here for his week 8 visit for the BeeHive study. He denies any problems from the last vaccination or any new medications. He forgot to bring his diary card back but will bring it at the next visit in 4 weeks.

## 2021-02-11 LAB — HEPATIC FUNCTION PANEL
AG Ratio: 2 (calc) (ref 1.0–2.5)
ALT: 21 U/L (ref 9–46)
AST: 21 U/L (ref 10–35)
Albumin: 4.7 g/dL (ref 3.6–5.1)
Alkaline phosphatase (APISO): 50 U/L (ref 35–144)
Bilirubin, Direct: 0.1 mg/dL (ref 0.0–0.2)
Globulin: 2.4 g/dL (calc) (ref 1.9–3.7)
Indirect Bilirubin: 0.4 mg/dL (calc) (ref 0.2–1.2)
Total Bilirubin: 0.5 mg/dL (ref 0.2–1.2)
Total Protein: 7.1 g/dL (ref 6.1–8.1)

## 2021-02-11 LAB — CBC WITH DIFFERENTIAL/PLATELET
Absolute Monocytes: 355 cells/uL (ref 200–950)
Basophils Absolute: 38 cells/uL (ref 0–200)
Basophils Relative: 0.8 %
Eosinophils Absolute: 120 cells/uL (ref 15–500)
Eosinophils Relative: 2.5 %
HCT: 46.1 % (ref 38.5–50.0)
Hemoglobin: 15.7 g/dL (ref 13.2–17.1)
Lymphs Abs: 1310 cells/uL (ref 850–3900)
MCH: 32 pg (ref 27.0–33.0)
MCHC: 34.1 g/dL (ref 32.0–36.0)
MCV: 94.1 fL (ref 80.0–100.0)
MPV: 10 fL (ref 7.5–12.5)
Monocytes Relative: 7.4 %
Neutro Abs: 2976 cells/uL (ref 1500–7800)
Neutrophils Relative %: 62 %
Platelets: 342 10*3/uL (ref 140–400)
RBC: 4.9 10*6/uL (ref 4.20–5.80)
RDW: 12.6 % (ref 11.0–15.0)
Total Lymphocyte: 27.3 %
WBC: 4.8 10*3/uL (ref 3.8–10.8)

## 2021-03-13 ENCOUNTER — Encounter (INDEPENDENT_AMBULATORY_CARE_PROVIDER_SITE_OTHER): Payer: Self-pay | Admitting: *Deleted

## 2021-03-13 ENCOUNTER — Other Ambulatory Visit: Payer: Self-pay

## 2021-03-13 VITALS — BP 150/86 | HR 92 | Temp 97.9°F | Wt 219.5 lb

## 2021-03-13 DIAGNOSIS — Z006 Encounter for examination for normal comparison and control in clinical research program: Secondary | ICD-10-CM

## 2021-03-13 NOTE — Research (Signed)
Jimmy [name] was here for his week 12 visit for A5379. He denies any new problems or medications. He returned his dairy from week 4 visit that he forgot at week 8 visit and has no ISRs. He will be returning in 12 weeks for the next visit.

## 2021-04-04 DIAGNOSIS — M2624 Reverse articulation: Secondary | ICD-10-CM | POA: Diagnosis not present

## 2021-04-04 DIAGNOSIS — R1311 Dysphagia, oral phase: Secondary | ICD-10-CM | POA: Diagnosis not present

## 2021-04-04 DIAGNOSIS — K08419 Partial loss of teeth due to trauma, unspecified class: Secondary | ICD-10-CM | POA: Diagnosis not present

## 2021-04-04 DIAGNOSIS — Z87828 Personal history of other (healed) physical injury and trauma: Secondary | ICD-10-CM | POA: Diagnosis not present
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
## 2021-04-16 ENCOUNTER — Ambulatory Visit: Payer: BC Managed Care – PPO | Admitting: Cardiology

## 2021-04-16 ENCOUNTER — Other Ambulatory Visit: Payer: Self-pay

## 2021-04-16 ENCOUNTER — Encounter: Payer: Self-pay | Admitting: Cardiology

## 2021-04-16 VITALS — BP 133/75 | HR 74 | Ht 71.0 in | Wt 221.8 lb

## 2021-04-16 DIAGNOSIS — I10 Essential (primary) hypertension: Secondary | ICD-10-CM

## 2021-04-16 DIAGNOSIS — I2510 Atherosclerotic heart disease of native coronary artery without angina pectoris: Secondary | ICD-10-CM | POA: Diagnosis not present

## 2021-04-16 DIAGNOSIS — R5383 Other fatigue: Secondary | ICD-10-CM

## 2021-04-16 DIAGNOSIS — E782 Mixed hyperlipidemia: Secondary | ICD-10-CM | POA: Diagnosis not present

## 2021-04-16 DIAGNOSIS — R931 Abnormal findings on diagnostic imaging of heart and coronary circulation: Secondary | ICD-10-CM | POA: Diagnosis not present

## 2021-04-16 DIAGNOSIS — R5381 Other malaise: Secondary | ICD-10-CM

## 2021-04-16 MED ORDER — DILTIAZEM HCL ER COATED BEADS 240 MG PO CP24: 240.0000 mg | ORAL_CAPSULE | Freq: Every day | ORAL | 3 refills | Status: DC

## 2021-04-16 NOTE — Patient Instructions (Signed)
Medication Instructions:  Your physician has recommended you make the following change in your medication:  STOP: TOPROL XL  START: Cardizem 240 mg take one tablet by mouth daily.  *If you need a refill on your cardiac medications before your next appointment, please call your pharmacy*   Lab Work: None If you have labs (blood work) drawn today and your tests are completely normal, you will receive your results only by: Marland Kitchen MyChart Message (if you have MyChart) OR . A paper copy in the mail If you have any lab test that is abnormal or we need to change your treatment, we will call you to review the results.   Testing/Procedures: None   Follow-Up: At [facility], you and your health needs are our priority.  As part of our continuing mission to provide you with exceptional heart care, we have created designated Provider Care Teams.  These Care Teams include your primary Cardiologist (physician) and Advanced Practice Providers (APPs -  Physician Assistants and Nurse Practitioners) who all work together to provide you with the care you need, when you need it.  We recommend signing up for the patient portal called "MyChart".  Sign up information is provided on this After Visit Summary.  MyChart is used to connect with patients for Virtual Visits (Telemedicine).  Patients are able to view lab/test results, encounter notes, upcoming appointments, etc.  Non-urgent messages can be sent to your provider as well.   To learn more about what you can do with MyChart, go to [email or web address].    Your next appointment:   6 month(s)  The format for your next appointment:   In Person  Provider:   Norman Herrlich, MD   Other Instructions

## 2021-06-04 ENCOUNTER — Encounter (INDEPENDENT_AMBULATORY_CARE_PROVIDER_SITE_OTHER): Payer: BC Managed Care – PPO | Admitting: *Deleted

## 2021-06-04 ENCOUNTER — Other Ambulatory Visit: Payer: Self-pay

## 2021-06-04 VITALS — BP 131/85 | HR 72 | Temp 98.1°F | Wt 224.4 lb

## 2021-06-04 DIAGNOSIS — Z006 Encounter for examination for normal comparison and control in clinical research program: Secondary | ICD-10-CM
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
## 2021-07-02 ENCOUNTER — Encounter: Payer: Self-pay | Admitting: Infectious Disease

## 2021-07-02 ENCOUNTER — Encounter (INDEPENDENT_AMBULATORY_CARE_PROVIDER_SITE_OTHER): Payer: Self-pay | Admitting: *Deleted

## 2021-07-02 ENCOUNTER — Other Ambulatory Visit: Payer: Self-pay

## 2021-07-02 VITALS — BP 138/88 | HR 71 | Temp 98.2°F

## 2021-07-02 DIAGNOSIS — Z006 Encounter for examination for normal comparison and control in clinical research program: Secondary | ICD-10-CM

## 2021-07-02 LAB — COMPREHENSIVE METABOLIC PANEL
AG Ratio: 2.1 (calc) (ref 1.0–2.5)
ALT: 23 U/L (ref 9–46)
AST: 20 U/L (ref 10–35)
Albumin: 4.8 g/dL (ref 3.6–5.1)
Alkaline phosphatase (APISO): 50 U/L (ref 35–144)
BUN: 11 mg/dL (ref 7–25)
CO2: 30 mmol/L (ref 20–32)
Calcium: 9.6 mg/dL (ref 8.6–10.3)
Chloride: 101 mmol/L (ref 98–110)
Creat: 0.98 mg/dL (ref 0.70–1.30)
Globulin: 2.3 g/dL (calc) (ref 1.9–3.7)
Glucose, Bld: 88 mg/dL (ref 65–99)
Potassium: 4.5 mmol/L (ref 3.5–5.3)
Sodium: 138 mmol/L (ref 135–146)
Total Bilirubin: 0.5 mg/dL (ref 0.2–1.2)
Total Protein: 7.1 g/dL (ref 6.1–8.1)

## 2021-07-02 LAB — BILIRUBIN, DIRECT: Bilirubin, Direct: 0.1 mg/dL (ref 0.0–0.2)

## 2021-07-02 LAB — PHOSPHORUS: Phosphorus: 3 mg/dL (ref 2.5–4.5)

## 2021-07-02 LAB — CK: Total CK: 80 U/L (ref 44–196)

## 2021-07-02 NOTE — Research (Signed)
Jimmy [name] was here today for his week 28 visit for A5379. He had not problems from his last heplisav injection and has completed the series. We did draw a CD4/CD8 and viral load today as part of the study. He denies any new problems or medications and will be returning in 4 weeks.

## 2021-07-12 ENCOUNTER — Other Ambulatory Visit: Payer: Self-pay | Admitting: Cardiology

## 2021-07-18 ENCOUNTER — Encounter: Payer: Self-pay | Admitting: *Deleted

## 2021-07-18 DIAGNOSIS — Z01818 Encounter for other preprocedural examination: Secondary | ICD-10-CM | POA: Diagnosis not present

## 2021-07-18 DIAGNOSIS — M2651 Abnormal jaw closure: Secondary | ICD-10-CM | POA: Diagnosis not present

## 2021-07-18 DIAGNOSIS — S0993XS Unspecified injury of face, sequela: Secondary | ICD-10-CM | POA: Diagnosis not present

## 2021-07-18 DIAGNOSIS — I70203 Unspecified atherosclerosis of native arteries of extremities, bilateral legs: Secondary | ICD-10-CM | POA: Diagnosis not present

## 2021-07-18 DIAGNOSIS — M264 Malocclusion, unspecified: Secondary | ICD-10-CM | POA: Diagnosis not present

## 2021-07-18 DIAGNOSIS — Z21 Asymptomatic human immunodeficiency virus [HIV] infection status: Secondary | ICD-10-CM | POA: Diagnosis not present

## 2021-07-18 DIAGNOSIS — S0993XA Unspecified injury of face, initial encounter: Secondary | ICD-10-CM | POA: Insufficient documentation

## 2021-07-18 HISTORY — DX: Abnormal jaw closure: M26.51

## 2021-07-18 LAB — HIV-1 RNA QUANT-NO REFLEX-BLD: HIV 1 RNA Quant: <40

## 2021-08-05 ENCOUNTER — Other Ambulatory Visit: Payer: Self-pay

## 2021-08-05 ENCOUNTER — Encounter (INDEPENDENT_AMBULATORY_CARE_PROVIDER_SITE_OTHER): Payer: Self-pay | Admitting: *Deleted

## 2021-08-05 VITALS — BP 133/76 | HR 75 | Temp 98.0°F

## 2021-08-05 DIAGNOSIS — Z006 Encounter for examination for normal comparison and control in clinical research program: Secondary | ICD-10-CM

## 2021-08-05 NOTE — Research (Signed)
Jimmy [name] was here for his week 32 visit for A5379. He denies any new problems or medications. He is getting set up for major surgery on his jaw after the first of the year. They will be taking a piece of his fibula to replace bone in the jaw, so we have scheduled his next visit in December before he has that done.

## 2021-10-20 ENCOUNTER — Other Ambulatory Visit: Payer: 59

## 2021-11-03 ENCOUNTER — Encounter: Payer: Self-pay | Admitting: Infectious Disease

## 2021-11-04 ENCOUNTER — Ambulatory Visit (INDEPENDENT_AMBULATORY_CARE_PROVIDER_SITE_OTHER): Payer: BC Managed Care – PPO | Admitting: Infectious Disease

## 2021-11-04 ENCOUNTER — Other Ambulatory Visit: Payer: Self-pay

## 2021-11-04 ENCOUNTER — Encounter (INDEPENDENT_AMBULATORY_CARE_PROVIDER_SITE_OTHER): Payer: Self-pay | Admitting: *Deleted

## 2021-11-04 ENCOUNTER — Encounter: Payer: Self-pay | Admitting: Infectious Disease

## 2021-11-04 VITALS — BP 144/73 | HR 76 | Temp 97.6°F | Wt 229.5 lb

## 2021-11-04 VITALS — BP 125/78 | HR 73 | Temp 97.9°F | Ht 71.0 in | Wt 229.5 lb

## 2021-11-04 DIAGNOSIS — E782 Mixed hyperlipidemia: Secondary | ICD-10-CM | POA: Diagnosis not present

## 2021-11-04 DIAGNOSIS — I10 Essential (primary) hypertension: Secondary | ICD-10-CM | POA: Diagnosis not present

## 2021-11-04 DIAGNOSIS — B20 Human immunodeficiency virus [HIV] disease: Secondary | ICD-10-CM

## 2021-11-04 DIAGNOSIS — K219 Gastro-esophageal reflux disease without esophagitis: Secondary | ICD-10-CM

## 2021-11-04 DIAGNOSIS — Z006 Encounter for examination for normal comparison and control in clinical research program: Secondary | ICD-10-CM

## 2021-11-04 DIAGNOSIS — Z21 Asymptomatic human immunodeficiency virus [HIV] infection status: Secondary | ICD-10-CM | POA: Diagnosis not present

## 2021-11-04 HISTORY — DX: Human immunodeficiency virus (HIV) disease: B20

## 2021-11-04 MED ORDER — TRIUMEQ 600-50-300 MG PO TABS: 1.0000 | ORAL_TABLET | Freq: Every day | ORAL | 11 refills | Status: DC
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
## 2021-11-04 NOTE — Research (Signed)
Jimmy [name] was here today for week 48 for the A5379 study. He denies any new problems. He is getting ready for surgery to repair his jaw after the first of the year. He is also seeing dr. Daiva Eves today. He will return for study in June of next year.

## 2021-11-04 NOTE — Patient Instructions (Signed)
When you cannot take pills you can crush and take w water or applesauce, avoid boost, ensure for 3 hours before and 6 hours after Triumeq

## 2021-11-05 LAB — T-HELPER CELL (CD4) - (RCID CLINIC ONLY)
CD4 % Helper T Cell: 47 % (ref 33–65)
CD4 T Cell Abs: 771 /uL (ref 400–1790)

## 2021-11-07 LAB — CBC WITH DIFFERENTIAL/PLATELET
Absolute Monocytes: 400 cells/uL (ref 200–950)
Basophils Absolute: 32 cells/uL (ref 0–200)
Basophils Relative: 0.6 %
Eosinophils Absolute: 167 cells/uL (ref 15–500)
Eosinophils Relative: 3.1 %
HCT: 46.7 % (ref 38.5–50.0)
Hemoglobin: 16.1 g/dL (ref 13.2–17.1)
Lymphs Abs: 1782 cells/uL (ref 850–3900)
MCH: 32.3 pg (ref 27.0–33.0)
MCHC: 34.5 g/dL (ref 32.0–36.0)
MCV: 93.8 fL (ref 80.0–100.0)
MPV: 10.4 fL (ref 7.5–12.5)
Monocytes Relative: 7.4 %
Neutro Abs: 3019 cells/uL (ref 1500–7800)
Neutrophils Relative %: 55.9 %
Platelets: 359 10*3/uL (ref 140–400)
RBC: 4.98 10*6/uL (ref 4.20–5.80)
RDW: 12.5 % (ref 11.0–15.0)
Total Lymphocyte: 33 %
WBC: 5.4 10*3/uL (ref 3.8–10.8)

## 2021-11-07 LAB — COMPLETE METABOLIC PANEL WITH GFR
AG Ratio: 2 (calc) (ref 1.0–2.5)
ALT: 23 U/L (ref 9–46)
AST: 17 U/L (ref 10–35)
Albumin: 4.9 g/dL (ref 3.6–5.1)
Alkaline phosphatase (APISO): 54 U/L (ref 35–144)
BUN: 13 mg/dL (ref 7–25)
CO2: 26 mmol/L (ref 20–32)
Calcium: 9.8 mg/dL (ref 8.6–10.3)
Chloride: 103 mmol/L (ref 98–110)
Creat: 0.96 mg/dL (ref 0.70–1.30)
Globulin: 2.4 g/dL (calc) (ref 1.9–3.7)
Glucose, Bld: 97 mg/dL (ref 65–99)
Potassium: 4.3 mmol/L (ref 3.5–5.3)
Sodium: 138 mmol/L (ref 135–146)
Total Bilirubin: 0.5 mg/dL (ref 0.2–1.2)
Total Protein: 7.3 g/dL (ref 6.1–8.1)
eGFR: 94 mL/min/{1.73_m2} (ref >=60)

## 2021-11-07 LAB — LIPID PANEL
Cholesterol: 135 mg/dL (ref <200)
HDL: 40 mg/dL (ref >=40)
LDL Cholesterol (Calc): 74 mg/dL (calc)
Non-HDL Cholesterol (Calc): 95 mg/dL (calc) (ref <130)
Total CHOL/HDL Ratio: 3.4 (calc) (ref <5.0)
Triglycerides: 128 mg/dL (ref <150)

## 2021-11-07 LAB — HIV-1 RNA QUANT-NO REFLEX-BLD
HIV 1 RNA Quant: NOT DETECTED Copies/mL
HIV-1 RNA Quant, Log: NOT DETECTED Log cps/mL

## 2021-11-07 LAB — RPR: RPR Ser Ql: NONREACTIVE
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
## 2021-11-24 ENCOUNTER — Encounter: Payer: Self-pay | Admitting: Cardiology

## 2021-11-24 ENCOUNTER — Other Ambulatory Visit: Payer: Self-pay

## 2021-11-24 ENCOUNTER — Ambulatory Visit: Payer: BC Managed Care – PPO | Admitting: Cardiology

## 2021-11-24 VITALS — BP 132/70 | HR 82 | Ht 71.0 in | Wt 228.4 lb

## 2021-11-24 DIAGNOSIS — I10 Essential (primary) hypertension: Secondary | ICD-10-CM

## 2021-11-24 DIAGNOSIS — I7789 Other specified disorders of arteries and arterioles: Secondary | ICD-10-CM | POA: Diagnosis not present

## 2021-11-24 DIAGNOSIS — R931 Abnormal findings on diagnostic imaging of heart and coronary circulation: Secondary | ICD-10-CM

## 2021-11-24 DIAGNOSIS — I2510 Atherosclerotic heart disease of native coronary artery without angina pectoris: Secondary | ICD-10-CM | POA: Diagnosis not present

## 2021-11-24 DIAGNOSIS — E782 Mixed hyperlipidemia: Secondary | ICD-10-CM

## 2021-11-24 NOTE — Patient Instructions (Signed)
Medication Instructions:  °Your physician recommends that you continue on your current medications as directed. Please refer to the Current Medication list given to you today. ° ° °*If you need a refill on your cardiac medications before your next appointment, please call your pharmacy* ° ° °Lab Work: °None °If you have labs (blood work) drawn today and your tests are completely normal, you will receive your results only by: °MyChart Message (if you have MyChart) OR °A paper copy in the mail °If you have any lab test that is abnormal or we need to change your treatment, we will call you to review the results. ° ° °Testing/Procedures: °None ° ° °Follow-Up: °At [facility], you and your health needs are our priority.  As part of our continuing mission to provide you with exceptional heart care, we have created designated Provider Care Teams.  These Care Teams include your primary Cardiologist (physician) and Advanced Practice Providers (APPs -  Physician Assistants and Nurse Practitioners) who all work together to provide you with the care you need, when you need it. ° °We recommend signing up for the patient portal called "MyChart".  Sign up information is provided on this After Visit Summary.  MyChart is used to connect with patients for Virtual Visits (Telemedicine).  Patients are able to view lab/test results, encounter notes, upcoming appointments, etc.  Non-urgent messages can be sent to your provider as well.   °To learn more about what you can do with MyChart, go to [email or web address].   ° °Your next appointment:   °1 year(s) ° °The format for your next appointment:   °In Person ° °Provider:   °Brian Munley, MD  ° ° °Other Instructions °None ° °

## 2022-01-09 ENCOUNTER — Encounter: Payer: Self-pay | Admitting: Infectious Disease
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
## 2022-01-13 ENCOUNTER — Encounter: Payer: Self-pay | Admitting: Infectious Disease

## 2022-01-13 ENCOUNTER — Ambulatory Visit: Payer: BC Managed Care – PPO | Admitting: Infectious Disease

## 2022-01-13 ENCOUNTER — Other Ambulatory Visit: Payer: Self-pay

## 2022-01-13 VITALS — BP 136/81 | HR 85 | Temp 98.1°F | Wt 226.0 lb

## 2022-01-13 DIAGNOSIS — B20 Human immunodeficiency virus [HIV] disease: Secondary | ICD-10-CM

## 2022-01-13 DIAGNOSIS — I10 Essential (primary) hypertension: Secondary | ICD-10-CM

## 2022-01-13 DIAGNOSIS — E782 Mixed hyperlipidemia: Secondary | ICD-10-CM | POA: Diagnosis not present

## 2022-01-13 DIAGNOSIS — S0993XD Unspecified injury of face, subsequent encounter: Secondary | ICD-10-CM | POA: Diagnosis not present

## 2022-01-13 MED ORDER — TRIUMEQ 600-50-300 MG PO TABS: 1.0000 | ORAL_TABLET | Freq: Every day | ORAL | 11 refills | Status: DC

## 2022-01-15 LAB — CBC WITH DIFFERENTIAL/PLATELET
Absolute Monocytes: 512 cells/uL (ref 200–950)
Basophils Absolute: 58 cells/uL (ref 0–200)
Basophils Relative: 0.9 %
Eosinophils Absolute: 230 cells/uL (ref 15–500)
Eosinophils Relative: 3.6 %
HCT: 47.5 % (ref 38.5–50.0)
Hemoglobin: 16.5 g/dL (ref 13.2–17.1)
Lymphs Abs: 1971 cells/uL (ref 850–3900)
MCH: 32.8 pg (ref 27.0–33.0)
MCHC: 34.7 g/dL (ref 32.0–36.0)
MCV: 94.4 fL (ref 80.0–100.0)
MPV: 9.9 fL (ref 7.5–12.5)
Monocytes Relative: 8 %
Neutro Abs: 3629 cells/uL (ref 1500–7800)
Neutrophils Relative %: 56.7 %
Platelets: 378 10*3/uL (ref 140–400)
RBC: 5.03 10*6/uL (ref 4.20–5.80)
RDW: 12.8 % (ref 11.0–15.0)
Total Lymphocyte: 30.8 %
WBC: 6.4 10*3/uL (ref 3.8–10.8)

## 2022-01-15 LAB — C. TRACHOMATIS/N. GONORRHOEAE RNA
C. trachomatis RNA, TMA: NOT DETECTED
N. gonorrhoeae RNA, TMA: NOT DETECTED

## 2022-01-15 LAB — T-HELPER CELLS (CD4) COUNT (NOT AT ARMC)
Absolute CD4: 1098 cells/uL (ref 490–1740)
CD4 T Helper %: 51 % (ref 30–61)
Total lymphocyte count: 2174 cells/uL (ref 850–3900)

## 2022-01-15 LAB — HIV-1 RNA QUANT-NO REFLEX-BLD
HIV 1 RNA Quant: NOT DETECTED Copies/mL
HIV-1 RNA Quant, Log: NOT DETECTED Log cps/mL

## 2022-01-15 LAB — COMPLETE METABOLIC PANEL WITH GFR
AG Ratio: 2 (calc) (ref 1.0–2.5)
ALT: 22 U/L (ref 9–46)
AST: 20 U/L (ref 10–35)
Albumin: 4.9 g/dL (ref 3.6–5.1)
Alkaline phosphatase (APISO): 56 U/L (ref 35–144)
BUN: 11 mg/dL (ref 7–25)
CO2: 29 mmol/L (ref 20–32)
Calcium: 10.3 mg/dL (ref 8.6–10.3)
Chloride: 103 mmol/L (ref 98–110)
Creat: 1.15 mg/dL (ref 0.70–1.30)
Globulin: 2.4 g/dL (calc) (ref 1.9–3.7)
Glucose, Bld: 80 mg/dL (ref 65–99)
Potassium: 4.1 mmol/L (ref 3.5–5.3)
Sodium: 140 mmol/L (ref 135–146)
Total Bilirubin: 0.5 mg/dL (ref 0.2–1.2)
Total Protein: 7.3 g/dL (ref 6.1–8.1)
eGFR: 75 mL/min/{1.73_m2} (ref >=60)

## 2022-02-12 ENCOUNTER — Encounter: Payer: Self-pay | Admitting: Cardiology

## 2022-02-16 DIAGNOSIS — Z01818 Encounter for other preprocedural examination: Secondary | ICD-10-CM | POA: Diagnosis not present

## 2022-02-16 DIAGNOSIS — I739 Peripheral vascular disease, unspecified: Secondary | ICD-10-CM | POA: Diagnosis not present

## 2022-02-16 DIAGNOSIS — S0993XS Unspecified injury of face, sequela: Secondary | ICD-10-CM | POA: Diagnosis not present

## 2022-02-16 DIAGNOSIS — M272 Inflammatory conditions of jaws: Secondary | ICD-10-CM | POA: Diagnosis not present

## 2022-02-16 DIAGNOSIS — M2651 Abnormal jaw closure: Secondary | ICD-10-CM | POA: Diagnosis not present

## 2022-02-16 DIAGNOSIS — S0993XD Unspecified injury of face, subsequent encounter: Secondary | ICD-10-CM | POA: Diagnosis not present

## 2022-02-16 DIAGNOSIS — S02651A Fracture of angle of right mandible, initial encounter for closed fracture: Secondary | ICD-10-CM | POA: Diagnosis not present

## 2022-02-16 DIAGNOSIS — S0301XA Dislocation of jaw, right side, initial encounter: Secondary | ICD-10-CM | POA: Diagnosis not present

## 2022-03-03 DIAGNOSIS — R140 Abdominal distension (gaseous): Secondary | ICD-10-CM | POA: Diagnosis not present

## 2022-03-03 DIAGNOSIS — Z7982 Long term (current) use of aspirin: Secondary | ICD-10-CM | POA: Diagnosis not present

## 2022-03-03 DIAGNOSIS — M2689 Other dentofacial anomalies: Secondary | ICD-10-CM | POA: Diagnosis not present

## 2022-03-03 DIAGNOSIS — I959 Hypotension, unspecified: Secondary | ICD-10-CM | POA: Diagnosis not present

## 2022-03-03 DIAGNOSIS — S02601B Fracture of unspecified part of body of right mandible, initial encounter for open fracture: Secondary | ICD-10-CM | POA: Diagnosis not present

## 2022-03-03 DIAGNOSIS — M2518 Fistula, other specified site: Secondary | ICD-10-CM | POA: Diagnosis not present

## 2022-03-03 DIAGNOSIS — M2651 Abnormal jaw closure: Secondary | ICD-10-CM | POA: Diagnosis not present

## 2022-03-03 DIAGNOSIS — B20 Human immunodeficiency virus [HIV] disease: Secondary | ICD-10-CM | POA: Diagnosis not present

## 2022-03-03 DIAGNOSIS — S02651A Fracture of angle of right mandible, initial encounter for closed fracture: Secondary | ICD-10-CM | POA: Diagnosis not present

## 2022-03-03 DIAGNOSIS — R531 Weakness: Secondary | ICD-10-CM | POA: Diagnosis not present

## 2022-03-03 DIAGNOSIS — M272 Inflammatory conditions of jaws: Secondary | ICD-10-CM | POA: Diagnosis not present

## 2022-03-03 DIAGNOSIS — J9601 Acute respiratory failure with hypoxia: Secondary | ICD-10-CM

## 2022-03-03 DIAGNOSIS — I10 Essential (primary) hypertension: Secondary | ICD-10-CM | POA: Diagnosis not present

## 2022-03-03 DIAGNOSIS — D62 Acute posthemorrhagic anemia: Secondary | ICD-10-CM | POA: Diagnosis not present

## 2022-03-03 DIAGNOSIS — M269 Dentofacial anomaly, unspecified: Secondary | ICD-10-CM | POA: Diagnosis not present

## 2022-03-03 DIAGNOSIS — R600 Localized edema: Secondary | ICD-10-CM | POA: Diagnosis not present

## 2022-03-03 DIAGNOSIS — K921 Melena: Secondary | ICD-10-CM | POA: Diagnosis not present

## 2022-03-03 DIAGNOSIS — E785 Hyperlipidemia, unspecified: Secondary | ICD-10-CM | POA: Diagnosis not present

## 2022-03-03 DIAGNOSIS — Z21 Asymptomatic human immunodeficiency virus [HIV] infection status: Secondary | ICD-10-CM | POA: Diagnosis not present

## 2022-03-03 DIAGNOSIS — S098XXD Other specified injuries of head, subsequent encounter: Secondary | ICD-10-CM | POA: Diagnosis not present

## 2022-03-03 DIAGNOSIS — R109 Unspecified abdominal pain: Secondary | ICD-10-CM | POA: Diagnosis not present

## 2022-03-03 DIAGNOSIS — S01441S Puncture wound with foreign body of right cheek and temporomandibular area, sequela: Secondary | ICD-10-CM | POA: Diagnosis not present

## 2022-03-03 DIAGNOSIS — S0301XA Dislocation of jaw, right side, initial encounter: Secondary | ICD-10-CM | POA: Diagnosis not present

## 2022-03-03 DIAGNOSIS — D649 Anemia, unspecified: Secondary | ICD-10-CM | POA: Diagnosis not present

## 2022-03-03 DIAGNOSIS — Z880 Allergy status to penicillin: Secondary | ICD-10-CM | POA: Diagnosis not present

## 2022-03-03 DIAGNOSIS — Z4659 Encounter for fitting and adjustment of other gastrointestinal appliance and device: Secondary | ICD-10-CM | POA: Diagnosis not present

## 2022-03-03 DIAGNOSIS — Z4682 Encounter for fitting and adjustment of non-vascular catheter: Secondary | ICD-10-CM | POA: Diagnosis not present

## 2022-03-03 DIAGNOSIS — Z79899 Other long term (current) drug therapy: Secondary | ICD-10-CM | POA: Diagnosis not present

## 2022-03-03 DIAGNOSIS — T8183XA Persistent postprocedural fistula, initial encounter: Secondary | ICD-10-CM | POA: Diagnosis not present

## 2022-03-03 DIAGNOSIS — K219 Gastro-esophageal reflux disease without esophagitis: Secondary | ICD-10-CM | POA: Diagnosis not present

## 2022-03-03 DIAGNOSIS — J9811 Atelectasis: Secondary | ICD-10-CM | POA: Diagnosis not present

## 2022-03-03 HISTORY — DX: Acute respiratory failure with hypoxia: J96.01

## 2022-03-25 DIAGNOSIS — R6889 Other general symptoms and signs: Secondary | ICD-10-CM | POA: Diagnosis not present

## 2022-03-25 DIAGNOSIS — I499 Cardiac arrhythmia, unspecified: Secondary | ICD-10-CM | POA: Diagnosis not present

## 2022-03-25 DIAGNOSIS — K922 Gastrointestinal hemorrhage, unspecified: Secondary | ICD-10-CM | POA: Diagnosis not present

## 2022-03-25 DIAGNOSIS — Z87891 Personal history of nicotine dependence: Secondary | ICD-10-CM | POA: Diagnosis not present

## 2022-03-25 DIAGNOSIS — Z743 Need for continuous supervision: Secondary | ICD-10-CM | POA: Diagnosis not present

## 2022-03-25 DIAGNOSIS — R404 Transient alteration of awareness: Secondary | ICD-10-CM | POA: Diagnosis not present

## 2022-03-25 DIAGNOSIS — J9811 Atelectasis: Secondary | ICD-10-CM | POA: Diagnosis not present

## 2022-03-25 DIAGNOSIS — K625 Hemorrhage of anus and rectum: Secondary | ICD-10-CM | POA: Diagnosis not present

## 2022-03-25 DIAGNOSIS — R42 Dizziness and giddiness: Secondary | ICD-10-CM | POA: Diagnosis not present

## 2022-03-26 ENCOUNTER — Inpatient Hospital Stay (HOSPITAL_COMMUNITY)
Admission: AD | Admit: 2022-03-26 | Discharge: 2022-03-30 | DRG: 378 | Disposition: A | Discharge status: Home / Self Care | Payer: BC Managed Care – PPO | Source: Other Acute Inpatient Hospital | Attending: Internal Medicine | Admitting: Internal Medicine

## 2022-03-26 DIAGNOSIS — Z87891 Personal history of nicotine dependence: Secondary | ICD-10-CM

## 2022-03-26 DIAGNOSIS — Z7982 Long term (current) use of aspirin: Secondary | ICD-10-CM | POA: Diagnosis not present

## 2022-03-26 DIAGNOSIS — I82621 Acute embolism and thrombosis of deep veins of right upper extremity: Secondary | ICD-10-CM | POA: Diagnosis not present

## 2022-03-26 DIAGNOSIS — K222 Esophageal obstruction: Secondary | ICD-10-CM | POA: Diagnosis present

## 2022-03-26 DIAGNOSIS — K269 Duodenal ulcer, unspecified as acute or chronic, without hemorrhage or perforation: Secondary | ICD-10-CM

## 2022-03-26 DIAGNOSIS — K922 Gastrointestinal hemorrhage, unspecified: Secondary | ICD-10-CM

## 2022-03-26 DIAGNOSIS — B20 Human immunodeficiency virus [HIV] disease: Secondary | ICD-10-CM | POA: Diagnosis not present

## 2022-03-26 DIAGNOSIS — Z79899 Other long term (current) drug therapy: Secondary | ICD-10-CM | POA: Diagnosis not present

## 2022-03-26 DIAGNOSIS — K921 Melena: Secondary | ICD-10-CM | POA: Diagnosis not present

## 2022-03-26 DIAGNOSIS — D62 Acute posthemorrhagic anemia: Secondary | ICD-10-CM | POA: Diagnosis present

## 2022-03-26 DIAGNOSIS — E785 Hyperlipidemia, unspecified: Secondary | ICD-10-CM | POA: Diagnosis not present

## 2022-03-26 DIAGNOSIS — Z8249 Family history of ischemic heart disease and other diseases of the circulatory system: Secondary | ICD-10-CM | POA: Diagnosis not present

## 2022-03-26 DIAGNOSIS — K219 Gastro-esophageal reflux disease without esophagitis: Secondary | ICD-10-CM | POA: Diagnosis present

## 2022-03-26 DIAGNOSIS — K264 Chronic or unspecified duodenal ulcer with hemorrhage: Principal | ICD-10-CM | POA: Diagnosis present

## 2022-03-26 DIAGNOSIS — Z91018 Allergy to other foods: Secondary | ICD-10-CM | POA: Diagnosis not present

## 2022-03-26 DIAGNOSIS — I10 Essential (primary) hypertension: Secondary | ICD-10-CM | POA: Diagnosis not present

## 2022-03-26 DIAGNOSIS — R609 Edema, unspecified: Secondary | ICD-10-CM | POA: Diagnosis not present

## 2022-03-26 DIAGNOSIS — I959 Hypotension, unspecified: Secondary | ICD-10-CM | POA: Diagnosis present

## 2022-03-26 DIAGNOSIS — Z880 Allergy status to penicillin: Secondary | ICD-10-CM

## 2022-03-26 HISTORY — DX: Acute posthemorrhagic anemia: D62

## 2022-03-26 HISTORY — DX: Gastrointestinal hemorrhage, unspecified: K92.2

## 2022-03-26 LAB — CBC
HCT: 21.1 % — ABNORMAL LOW (ref 39.0–52.0)
Hemoglobin: 6.9 g/dL — CL (ref 13.0–17.0)
MCH: 29.6 pg (ref 26.0–34.0)
MCHC: 32.7 g/dL (ref 30.0–36.0)
MCV: 90.6 fL (ref 80.0–100.0)
Platelets: 382 10*3/uL (ref 150–400)
RBC: 2.33 MIL/uL — ABNORMAL LOW (ref 4.22–5.81)
RDW: 17.1 % — ABNORMAL HIGH (ref 11.5–15.5)
WBC: 5.2 10*3/uL (ref 4.0–10.5)
nRBC: 0 % (ref 0.0–0.2)

## 2022-03-26 LAB — ABO/RH: ABO/RH(D): A POS

## 2022-03-26 LAB — COMPREHENSIVE METABOLIC PANEL
ALT: 27 U/L (ref 0–44)
AST: 17 U/L (ref 15–41)
Albumin: 2.5 g/dL — ABNORMAL LOW (ref 3.5–5.0)
Alkaline Phosphatase: 48 U/L (ref 38–126)
Anion gap: 5 (ref 5–15)
BUN: 29 mg/dL — ABNORMAL HIGH (ref 6–20)
CO2: 24 mmol/L (ref 22–32)
Calcium: 8 mg/dL — ABNORMAL LOW (ref 8.9–10.3)
Chloride: 107 mmol/L (ref 98–111)
Creatinine, Ser: 0.84 mg/dL (ref 0.61–1.24)
GFR, Estimated: >60 mL/min (ref >60)
Glucose, Bld: 123 mg/dL — ABNORMAL HIGH (ref 70–99)
Potassium: 4 mmol/L (ref 3.5–5.1)
Sodium: 136 mmol/L (ref 135–145)
Total Bilirubin: 0.5 mg/dL (ref 0.3–1.2)
Total Protein: 5 g/dL — ABNORMAL LOW (ref 6.5–8.1)

## 2022-03-26 LAB — PREPARE RBC (CROSSMATCH)

## 2022-03-26 MED ORDER — ACETAMINOPHEN 650 MG RE SUPP
650.0000 mg | Freq: Four times a day (QID) | RECTAL | Status: DC | PRN
Start: 2022-03-26 — End: 2022-03-30

## 2022-03-26 MED ORDER — PANTOPRAZOLE INFUSION (NEW) - SIMPLE MED
8.0000 mg/h | INTRAVENOUS | Status: DC
  Administered 2022-03-27 (×3): 8 mg/h via INTRAVENOUS
  Filled 2022-03-26: qty 80
  Filled 2022-03-26 (×2): qty 100
  Filled 2022-03-26: qty 80

## 2022-03-26 MED ORDER — ONDANSETRON HCL 4 MG/2ML IJ SOLN: 4.0000 mg | Freq: Four times a day (QID) | INTRAMUSCULAR | Status: DC | PRN

## 2022-03-26 MED ORDER — LACTATED RINGERS IV BOLUS
1000.0000 mL | Freq: Once | INTRAVENOUS | Status: AC
  Administered 2022-03-26: 1000 mL via INTRAVENOUS

## 2022-03-26 MED ORDER — SODIUM CHLORIDE 0.9% IV SOLUTION: Freq: Once | INTRAVENOUS | Status: AC

## 2022-03-26 MED ORDER — ACETAMINOPHEN 325 MG PO TABS: 650.0000 mg | ORAL_TABLET | Freq: Four times a day (QID) | ORAL | Status: DC | PRN

## 2022-03-26 MED ORDER — ONDANSETRON HCL 4 MG PO TABS
4.0000 mg | ORAL_TABLET | Freq: Four times a day (QID) | ORAL | Status: DC | PRN
  Filled 2022-03-26: qty 1

## 2022-03-26 MED ORDER — ABACAVIR-DOLUTEGRAVIR-LAMIVUD 600-50-300 MG PO TABS
1.0000 | ORAL_TABLET | Freq: Every day | ORAL | Status: DC
  Administered 2022-03-27 – 2022-03-30 (×4): 1 via ORAL
  Filled 2022-03-26 (×5): qty 1

## 2022-03-26 MED ORDER — PANTOPRAZOLE SODIUM 40 MG IV SOLR: 40.0000 mg | Freq: Two times a day (BID) | INTRAVENOUS | Status: DC

## 2022-03-26 NOTE — Assessment & Plan Note (Signed)
Hold BP meds, pt hypotensive currently from acute GIB. ?

## 2022-03-27 ENCOUNTER — Encounter (HOSPITAL_COMMUNITY): Payer: Self-pay | Admitting: Internal Medicine

## 2022-03-27 ENCOUNTER — Observation Stay (HOSPITAL_COMMUNITY): Payer: BC Managed Care – PPO

## 2022-03-27 ENCOUNTER — Observation Stay (HOSPITAL_COMMUNITY): Payer: BC Managed Care – PPO | Admitting: Certified Registered Nurse Anesthetist

## 2022-03-27 ENCOUNTER — Other Ambulatory Visit: Payer: Self-pay

## 2022-03-27 ENCOUNTER — Encounter (HOSPITAL_COMMUNITY): Admission: AD | Disposition: A | Discharge status: Home / Self Care | Payer: Self-pay | Attending: Internal Medicine

## 2022-03-27 DIAGNOSIS — K922 Gastrointestinal hemorrhage, unspecified: Secondary | ICD-10-CM | POA: Diagnosis not present

## 2022-03-27 DIAGNOSIS — Z8249 Family history of ischemic heart disease and other diseases of the circulatory system: Secondary | ICD-10-CM | POA: Diagnosis not present

## 2022-03-27 DIAGNOSIS — Z7982 Long term (current) use of aspirin: Secondary | ICD-10-CM | POA: Diagnosis not present

## 2022-03-27 DIAGNOSIS — R609 Edema, unspecified: Secondary | ICD-10-CM | POA: Diagnosis not present

## 2022-03-27 DIAGNOSIS — K222 Esophageal obstruction: Secondary | ICD-10-CM | POA: Diagnosis not present

## 2022-03-27 DIAGNOSIS — I10 Essential (primary) hypertension: Secondary | ICD-10-CM

## 2022-03-27 DIAGNOSIS — Z880 Allergy status to penicillin: Secondary | ICD-10-CM | POA: Diagnosis not present

## 2022-03-27 DIAGNOSIS — K269 Duodenal ulcer, unspecified as acute or chronic, without hemorrhage or perforation: Secondary | ICD-10-CM | POA: Diagnosis not present

## 2022-03-27 DIAGNOSIS — D62 Acute posthemorrhagic anemia: Secondary | ICD-10-CM

## 2022-03-27 DIAGNOSIS — K219 Gastro-esophageal reflux disease without esophagitis: Secondary | ICD-10-CM | POA: Diagnosis present

## 2022-03-27 DIAGNOSIS — B20 Human immunodeficiency virus [HIV] disease: Secondary | ICD-10-CM | POA: Diagnosis not present

## 2022-03-27 DIAGNOSIS — K921 Melena: Secondary | ICD-10-CM | POA: Diagnosis not present

## 2022-03-27 DIAGNOSIS — K264 Chronic or unspecified duodenal ulcer with hemorrhage: Secondary | ICD-10-CM | POA: Diagnosis not present

## 2022-03-27 DIAGNOSIS — Z87891 Personal history of nicotine dependence: Secondary | ICD-10-CM | POA: Diagnosis not present

## 2022-03-27 DIAGNOSIS — Z79899 Other long term (current) drug therapy: Secondary | ICD-10-CM | POA: Diagnosis not present

## 2022-03-27 DIAGNOSIS — I959 Hypotension, unspecified: Secondary | ICD-10-CM | POA: Diagnosis present

## 2022-03-27 DIAGNOSIS — I82621 Acute embolism and thrombosis of deep veins of right upper extremity: Secondary | ICD-10-CM | POA: Diagnosis not present

## 2022-03-27 DIAGNOSIS — Z91018 Allergy to other foods: Secondary | ICD-10-CM | POA: Diagnosis not present

## 2022-03-27 DIAGNOSIS — E785 Hyperlipidemia, unspecified: Secondary | ICD-10-CM | POA: Diagnosis present

## 2022-03-27 HISTORY — PX: HOT HEMOSTASIS: SHX5433

## 2022-03-27 HISTORY — PX: SCLEROTHERAPY: SHX6841

## 2022-03-27 HISTORY — PX: ESOPHAGOGASTRODUODENOSCOPY (EGD) WITH PROPOFOL: SHX5813

## 2022-03-27 LAB — HEMOGLOBIN AND HEMATOCRIT, BLOOD
HCT: 24.1 % — ABNORMAL LOW (ref 39.0–52.0)
Hemoglobin: 7.9 g/dL — ABNORMAL LOW (ref 13.0–17.0)

## 2022-03-27 LAB — POCT I-STAT, CHEM 8
BUN: 34 mg/dL — ABNORMAL HIGH (ref 6–20)
Calcium, Ion: 1.14 mmol/L — ABNORMAL LOW (ref 1.15–1.40)
Chloride: 102 mmol/L (ref 98–111)
Creatinine, Ser: 0.8 mg/dL (ref 0.61–1.24)
Glucose, Bld: 104 mg/dL — ABNORMAL HIGH (ref 70–99)
HCT: 23 % — ABNORMAL LOW (ref 39.0–52.0)
Hemoglobin: 7.8 g/dL — ABNORMAL LOW (ref 13.0–17.0)
Potassium: 4.2 mmol/L (ref 3.5–5.1)
Sodium: 136 mmol/L (ref 135–145)
TCO2: 26 mmol/L (ref 22–32)

## 2022-03-27 SURGERY — ESOPHAGOGASTRODUODENOSCOPY (EGD) WITH PROPOFOL
Anesthesia: Monitor Anesthesia Care

## 2022-03-27 MED ORDER — CLINDAMYCIN HCL 300 MG PO CAPS
300.0000 mg | ORAL_CAPSULE | Freq: Three times a day (TID) | ORAL | Status: DC
  Administered 2022-03-27 – 2022-03-29 (×6): 300 mg via ORAL
  Filled 2022-03-27 (×8): qty 1

## 2022-03-27 MED ORDER — EPINEPHRINE 1 MG/10ML IJ SOSY
PREFILLED_SYRINGE | INTRAMUSCULAR | Status: AC
  Filled 2022-03-27: qty 10

## 2022-03-27 MED ORDER — GLUCAGON HCL RDNA (DIAGNOSTIC) 1 MG IJ SOLR
INTRAMUSCULAR | Status: DC | PRN
  Administered 2022-03-27: .5 mg via INTRAVENOUS

## 2022-03-27 MED ORDER — PROPOFOL 500 MG/50ML IV EMUL
INTRAVENOUS | Status: DC | PRN
  Administered 2022-03-27: 150 ug/kg/min via INTRAVENOUS
  Administered 2022-03-27: 100 ug/kg/min via INTRAVENOUS

## 2022-03-27 MED ORDER — BOOST / RESOURCE BREEZE PO LIQD CUSTOM: 1.0000 | Freq: Three times a day (TID) | ORAL | Status: DC

## 2022-03-27 MED ORDER — EPINEPHRINE 1 MG/10ML IJ SOSY
PREFILLED_SYRINGE | INTRAMUSCULAR | Status: DC | PRN
  Administered 2022-03-27: .4 mg

## 2022-03-27 MED ORDER — SODIUM CHLORIDE 0.9 % IV SOLN: INTRAVENOUS | Status: DC | PRN

## 2022-03-27 MED ORDER — LIDOCAINE 2% (20 MG/ML) 5 ML SYRINGE
INTRAMUSCULAR | Status: DC | PRN
  Administered 2022-03-27: 60 mg via INTRAVENOUS

## 2022-03-27 MED ORDER — ENSURE ENLIVE PO LIQD
237.0000 mL | Freq: Three times a day (TID) | ORAL | Status: DC
  Administered 2022-03-28: 237 mL via ORAL

## 2022-03-27 MED ORDER — PROPOFOL 10 MG/ML IV BOLUS
INTRAVENOUS | Status: DC | PRN
  Administered 2022-03-27: 20 mg via INTRAVENOUS
  Administered 2022-03-27: 30 mg via INTRAVENOUS
  Administered 2022-03-27: 20 mg via INTRAVENOUS
  Administered 2022-03-27 (×4): 25 mg via INTRAVENOUS

## 2022-03-27 SURGICAL SUPPLY — 15 items

## 2022-03-27 NOTE — Anesthesia Preprocedure Evaluation (Signed)
Anesthesia Evaluation  ?Patient identified by MRN, date of birth, ID band ?Patient awake ? ? ? ?Reviewed: ?Allergy & Precautions, NPO status , Patient's Chart, lab work & pertinent test results ? ?Airway ?Mallampati: II ? ?TM Distance: >3 FB ?Neck ROM: Full ? ? ? Dental ? ?(+) Dental Advisory Given ?  ?Pulmonary ?former smoker,  ?  ?Pulmonary exam normal ?breath sounds clear to auscultation ? ? ? ? ? ? Cardiovascular ?hypertension, Pt. on medications ?Normal cardiovascular exam ?Rhythm:Regular Rate:Normal ? ? ?  ?Neuro/Psych ?negative neurological ROS ? negative psych ROS  ? GI/Hepatic ?Neg liver ROS, GERD  Medicated,GI bleed ?  ?Endo/Other  ?negative endocrine ROS ? Renal/GU ?negative Renal ROS  ? ?  ?Musculoskeletal ?negative musculoskeletal ROS ?(+)  ? Abdominal ?  ?Peds ? Hematology ? ?(+) Blood dyscrasia, anemia , HIV,   ?Anesthesia Other Findings ?Day of surgery medications reviewed with the patient. ? Reproductive/Obstetrics ? ?  ? ? ? ? ? ? ? ? ? ? ? ? ? ?  ?  ? ? ? ? ? ? ? ? ?Anesthesia Physical ?Anesthesia Plan ? ?ASA: 3 ? ?Anesthesia Plan: MAC  ? ?Post-op Pain Management: Minimal or no pain anticipated  ? ?Induction: Intravenous ? ?PONV Risk Score and Plan: 1 and TIVA and Treatment may vary due to age or medical condition ? ?Airway Management Planned: Natural Airway and Nasal Cannula ? ?Additional Equipment:  ? ?Intra-op Plan:  ? ?Post-operative Plan:  ? ?Informed Consent: I have reviewed the patients History and Physical, chart, labs and discussed the procedure including the risks, benefits and alternatives for the proposed anesthesia with the patient or authorized representative who has indicated his/her understanding and acceptance.  ? ? ? ?Dental advisory given ? ?Plan Discussed with: CRNA ? ?Anesthesia Plan Comments:   ? ? ? ? ? ? ?Anesthesia Quick Evaluation ? ?

## 2022-03-27 NOTE — Transfer of Care (Signed)
Immediate Anesthesia Transfer of Care Note ? ?Patient: Jimmy [name] ? ?Procedure(s) Performed: ESOPHAGOGASTRODUODENOSCOPY (EGD) WITH PROPOFOL ?SCLEROTHERAPY ?HOT HEMOSTASIS (ARGON PLASMA COAGULATION/BICAP) ? ?Patient Location: Endoscopy Unit ? ?Anesthesia Type:MAC ? ?Level of Consciousness: drowsy ? ?Airway & Oxygen Therapy: Patient Spontanous Breathing ? ?Post-op Assessment: Report given to RN and Post -op Vital signs reviewed and stable ? ?Post vital signs: Reviewed and stable ? ?Last Vitals:  ?Vitals Value Taken Time  ?BP 133/59 03/27/22 1150  ?Temp    ?Pulse 79 03/27/22 1151  ?Resp 26 03/27/22 1151  ?SpO2 98 % 03/27/22 1151  ?Vitals shown include unvalidated device data. ? ?Last Pain:  ?Vitals:  ? 03/27/22 0715  ?TempSrc: Oral  ?PainSc:   ?   ? ?  ? ?Complications: No notable events documented. ?
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
## 2022-03-27 NOTE — Assessment & Plan Note (Signed)
-   Continue HAART 

## 2022-03-27 NOTE — Anesthesia Postprocedure Evaluation (Signed)
Anesthesia Post Note ? ?Patient: Jimmy [name] ? ?Procedure(s) Performed: ESOPHAGOGASTRODUODENOSCOPY (EGD) WITH PROPOFOL ?SCLEROTHERAPY ?HOT HEMOSTASIS (ARGON PLASMA COAGULATION/BICAP) ? ?  ? ?Patient location during evaluation: Endoscopy ?Anesthesia Type: MAC ?Level of consciousness: oriented, awake and alert and awake ?Pain management: pain level controlled ?Vital Signs Assessment: post-procedure vital signs reviewed and stable ?Respiratory status: spontaneous breathing, nonlabored ventilation, respiratory function stable and patient connected to nasal cannula oxygen ?Cardiovascular status: blood pressure returned to baseline and stable ?Postop Assessment: no headache, no backache and no apparent nausea or vomiting ?Anesthetic complications: no ? ? ?No notable events documented. ? ?Last Vitals:  ?Vitals:  ? 03/27/22 1209 03/27/22 1214  ?BP: (!) 143/62 (!) 143/56  ?Pulse: 78 76  ?Resp: 15 11  ?Temp:    ?SpO2: 100% 100%  ?  ?Last Pain:  ?Vitals:  ? 03/27/22 1214  ?TempSrc:   ?PainSc: 0-No pain  ? ? ?  ?  ?  ?  ?  ?  ? ?Santa Lighter ? ? ? ? ?

## 2022-03-27 NOTE — Progress Notes (Addendum)
Initial Nutrition Assessment ? ?DOCUMENTATION CODES:  ? ?Not applicable ? ?INTERVENTION:  ? ?Diet advancement per MD.  ? ?When diet advanced, can give Ensure Enlive po TID, each supplement provides 350 kcal and 20 grams of protein. ? ?NUTRITION DIAGNOSIS:  ? ?Inadequate oral intake related to acute illness (GI bleed) as evidenced by other (comment) (clear liquid diet). ? ?GOAL:  ? ?Patient will meet greater than or equal to 90% of their needs ? ?MONITOR:  ? ?PO intake, Supplement acceptance, Diet advancement, Labs ? ?REASON FOR ASSESSMENT:  ? ?Malnutrition Screening Tool ?  ? ?ASSESSMENT:  ? ?56 yo male admitted with GI bleed. PMH includes HIV, HTN, HLD, recent mandible reconstruction 4/18. ? ?S/P EGD today. ?Unable to speak with patient or perform NFPE as patient was out of his room. ? ?Labs and medications reviewed. ? ?On admission, patient reported weight loss and poor intake r/t poor appetite. Weight history reviewed. Patient with 11% severe weight loss within the past 3 months. Suspect poor appetite/intake and weight loss is related to difficulty chewing after recent mandible reconstruction. Patient would benefit from PO supplements. Has been NPO for procedure today. Clear liquid diet being resumed this afternoon. Per discussion with RN, patient does not want the [facility], he is requesting Ensure. Will go ahead and change the supplement order to Ensure in anticipation of diet advancement this afternoon.  ? ?NUTRITION - FOCUSED PHYSICAL EXAM: ? ?Unable to complete, patient out of room ? ?Diet Order:   ?Diet Order   ? ?       ?  Diet clear liquid Room service appropriate? Yes; Fluid consistency: Thin  Diet effective now       ?  ? ?  ?  ? ?  ? ? ?EDUCATION NEEDS:  ? ?No education needs have been identified at this time ? ?Skin:  Skin Assessment: Reviewed RN Assessment ? ?Last BM:  5/12 ? ?Height:  ? ?Ht Readings from Last 1 Encounters:  ?03/26/22 5\' 11"  (1.803 m)  ? ? ?Weight:  ? ?Wt Readings from Last 1  Encounters:  ?03/26/22 91.5 kg  ? ? ? ?BMI:  Body mass index is 28.13 kg/m?. ? ?Estimated Nutritional Needs:  ? ?Kcal:  2300-2500 ? ?Protein:  110-125 gm ? ?Fluid:  >/= 2.3 L ? ? ? ?05/26/22 RD, LDN, CNSC ?Please refer to Amion for contact information.                                                       ? ?

## 2022-03-27 NOTE — Interval H&P Note (Signed)
History and Physical Interval Note: ? ?03/27/2022 ?11:06 AM ? ?Jimmy [name]  has presented today for surgery, with the diagnosis of GI bleed.  The various methods of treatment have been discussed with the patient and family. After consideration of risks, benefits and other options for treatment, the patient has consented to  Procedure(s): ?ESOPHAGOGASTRODUODENOSCOPY (EGD) WITH PROPOFOL (N/A) as a surgical intervention.  The patient's history has been reviewed, patient examined, no change in status, stable for surgery.  I have reviewed the patient's chart and labs.  Questions were answered to the patient's satisfaction.   ? ? ?[name] ? ? ?

## 2022-03-27 NOTE — Anesthesia Procedure Notes (Signed)
Procedure Name: [place] ?Date/Time: 03/27/2022 11:10 AM ?Performed by: Colin Benton, CRNA ?Pre-anesthesia Checklist: Patient identified, Emergency Drugs available, Suction available and Patient being monitored ?Oxygen Delivery Method: Simple face mask ?Induction Type: IV induction ?Airway Equipment and Method: Bite block ?Placement Confirmation: positive ETCO2 ?Dental Injury: Teeth and Oropharynx as per pre-operative assessment  ?Comments: Pediatric bite block used ? ? ? ? ?

## 2022-03-27 NOTE — Assessment & Plan Note (Addendum)
Dark red blood per rectum. ?From HPI, sounds like may be upper GIB with recent Dobbhoff tube at [facility] and 4u PRBC transfusion requirement at [facility] + 2u PRBC earlier today at [facility]. ?1. HGB back down to 6.9 now from 8.0 earlier today post transfusion at [facility]. ?1. 2u PRBC transfusion ordered with 2 to stay ahead ?2. Tele monitor ?3. GI consult - sent message to Dr. Lyndel Safe ?4. NPO ?5. CT at [facility] was neg for extravasation of contrast. ?6. Protonix GTT ?7. INR = 1.0 at [facility] ?8. Sounds like next step may just be that he needs EGD? ?
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
## 2022-03-27 NOTE — Assessment & Plan Note (Signed)
Due to GIB. ?1. 2u PRBC transfusion ordered, 2 to stay ahead ?2. H/H Q6H ?

## 2022-03-27 NOTE — Progress Notes (Signed)
Left upper extremity venous duplex completed. ?Refer to "CV Proc" under chart review to view preliminary results. ? ?03/27/2022 4:33 PM ?Eula Fried., MHA, RVT, RDCS, RDMS   ?
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
## 2022-03-27 NOTE — Op Note (Signed)
[facility] ?Patient Name: Jimmy [name] ?Procedure Date : 03/27/2022 ?MRN: [id number] ?Attending MD: Estill Cotta. Loletha Carrow , MD ?Date of Birth: 1966/03/31 ?CSN: [id number] ?Age: 56 ?Admit Type: Inpatient ?Procedure:                Upper GI endoscopy ?Indications:              Acute post hemorrhagic anemia, Melena ?Providers:                Estill Cotta. Loletha Carrow, MD, Jaci Carrel, RN, Alphonzo Grieve  ?                          Leighton Roach, Technician ?Referring MD:             [facility] ?Medicines:                Monitored Anesthesia Care ?Complications:            No immediate complications. ?Estimated Blood Loss:     Estimated blood loss was minimal. ?Procedure:                Pre-Anesthesia Assessment: ?                          - Prior to the procedure, a History and Physical  ?                          was performed, and patient medications and  ?                          allergies were reviewed. The patient's tolerance of  ?                          previous anesthesia was also reviewed. The risks  ?                          and benefits of the procedure and the sedation  ?                          options and risks were discussed with the patient.  ?                          All questions were answered, and informed consent  ?                          was obtained. Prior Anticoagulants: The patient has  ?                          taken no previous anticoagulant or antiplatelet  ?                          agents. ASA Grade Assessment: III - A patient with  ?                          severe systemic disease. After reviewing the risks  ?  and benefits, the patient was deemed in  ?                          satisfactory condition to undergo the procedure. ?                          After obtaining informed consent, the endoscope was  ?                          passed under direct vision. Throughout the  ?                          procedure, the patient's blood pressure, pulse, and  ?                           oxygen saturations were monitored continuously. The  ?                          [id number] Olympus endoscope was introduced  ?                          through the mouth, and advanced to the second part  ?                          of duodenum. The upper GI endoscopy was  ?                          accomplished without difficulty. The patient  ?                          tolerated the procedure fairly well. ?Scope In: ?Scope Out: ?Findings: ?     One benign-appearing, intrinsic moderate stenosis was found at the  ?     gastroesophageal junction. This stenosis measured 1.2 to 1.4 cm (inner  ?     diameter). The stenosis was traversed. ?     The stomach was normal. ?     The cardia and gastric fundus were normal on retroflexion. ?     Two oozing cratered duodenal ulcers were found in the duodenal bulb. The  ?     largest lesion was 12-14 mm in largest dimension, occupying about  ?     one-third the superior/poterior aspect of the distal bulb. There was  ?     scant fresh blood intermittently from this lesion, though the ulcer  ?     could not be completely visualized due to its challenging location. Area  ?     was successfully injected with 4 mL of a 1:10,000 solution of  ?     epinephrine for hemostasis. Coagulation for hemostasis using bipolar  ?     probe was successful (applied to a friable edge). ?     An examination of the duodenum was not performed. ?Impression:               - Benign-appearing esophageal stenosis. ?                          - Normal stomach. ?                          -  Oozing duodenal ulcers. Injected. Treated with  ?                          bipolar cautery. ?                          - No specimens collected. ?Recommendation:           - Return patient to hospital ward for ongoing care. ?                          - Clear liquid diet and boost breeze. ?                          - Q 8 hr Hgb, Hct ?                          Serum H pylori IgG tomorrow AM ?                           Resume pantoprazole drip ?                          No aspirin ?                          If patient continues to bleed as evidenced by  ?                          continued melena and dropping Hgb, we will need to  ?                          consult IR since no more endoscopic therapy can be  ?                          applied to this ulcer. ?Procedure Code(s):        --- Professional --- ?                          [id number], Esophagogastroduodenoscopy, flexible,  ?                          transoral; with control of bleeding, any method ?Diagnosis Code(s):        --- Professional --- ?                          K22.2, Esophageal obstruction ?                          K26.4, Chronic or unspecified duodenal ulcer with  ?                          hemorrhage ?                          D62, Acute posthemorrhagic anemia ?  K92.1, Melena (includes Hematochezia) ?CPT copyright 2019 [employer] Association. All rights reserved. ?The codes documented in this report are preliminary and upon coder review may  ?be revised to meet current compliance requirements. ?[name], MD ?03/27/2022 12:07:45 PM ?This report has been signed electronically. ?Number of Addenda: 0 ?

## 2022-03-28 DIAGNOSIS — D62 Acute posthemorrhagic anemia: Secondary | ICD-10-CM | POA: Diagnosis not present

## 2022-03-28 DIAGNOSIS — K264 Chronic or unspecified duodenal ulcer with hemorrhage: Secondary | ICD-10-CM | POA: Diagnosis not present

## 2022-03-28 LAB — HEMOGLOBIN AND HEMATOCRIT, BLOOD
HCT: 23.4 % — ABNORMAL LOW (ref 39.0–52.0)
HCT: 21.2 % — ABNORMAL LOW (ref 39.0–52.0)
Hemoglobin: 7.6 g/dL — ABNORMAL LOW (ref 13.0–17.0)
Hemoglobin: 7 g/dL — ABNORMAL LOW (ref 13.0–17.0)

## 2022-03-28 LAB — CBC
HCT: 23.8 % — ABNORMAL LOW (ref 39.0–52.0)
Hemoglobin: 8.3 g/dL — ABNORMAL LOW (ref 13.0–17.0)
MCH: 31.2 pg (ref 26.0–34.0)
MCHC: 34.9 g/dL (ref 30.0–36.0)
MCV: 89.5 fL (ref 80.0–100.0)
Platelets: 330 10*3/uL (ref 150–400)
RBC: 2.66 MIL/uL — ABNORMAL LOW (ref 4.22–5.81)
RDW: 16.8 % — ABNORMAL HIGH (ref 11.5–15.5)
WBC: 5.9 10*3/uL (ref 4.0–10.5)
nRBC: 0 % (ref 0.0–0.2)

## 2022-03-28 LAB — PREPARE RBC (CROSSMATCH)

## 2022-03-28 MED ORDER — SODIUM CHLORIDE 0.9% IV SOLUTION: Freq: Once | INTRAVENOUS | Status: AC

## 2022-03-28 MED ORDER — SODIUM CHLORIDE 0.9% FLUSH: 10.0000 mL | INTRAVENOUS | Status: DC | PRN

## 2022-03-28 MED ORDER — SODIUM CHLORIDE 0.9% FLUSH
10.0000 mL | Freq: Two times a day (BID) | INTRAVENOUS | Status: DC
  Administered 2022-03-28 – 2022-03-30 (×5): 10 mL

## 2022-03-28 MED ORDER — PANTOPRAZOLE SODIUM 40 MG IV SOLR
40.0000 mg | Freq: Two times a day (BID) | INTRAVENOUS | Status: DC
  Administered 2022-03-28 – 2022-03-30 (×5): 40 mg via INTRAVENOUS
  Filled 2022-03-28 (×5): qty 10

## 2022-03-28 NOTE — Progress Notes (Addendum)
Patient ID: Jimmy [name], male   DOB: 08-12-1966, 56 y.o.   MRN: [id number] ? ? ? Progress Note ? ? Subjective  ?Day # 2 ? CC;GI bleed ? ?EGD yesterday-benign esophageal stricture, oozing duodenal ulcers, injected and treated with BiCap ? ?Left upper extremity Doppler-positive for superficial vein thrombosis of the left cephalic vein ? ?H. pylori IgG pending ?Labs this a.m. hemoglobin 7.0/hematocrit 21.2-being transfused ? ?Patient says he feels better than yesterday, no abdominal pain, he has been afraid to eat or drink, no bowel movement since endoscopy yesterday ? ? Objective  ? ?Vital signs in last 24 hours: ?Temp:  [97.7 ?F (36.5 ?C)-98.7 ?F (37.1 ?C)] 98.7 ?F (37.1 ?C) (05/13 [id number]) ?Pulse Rate:  [74-86] 81 (05/13 0841) ?Resp:  [10-24] 12 (05/13 0841) ?BP: (91-143)/(51-81) 98/57 (05/13 0841) ?SpO2:  [95 %-100 %] 96 % (05/13 0841) ?Last BM Date : 03/27/22 ?General:    white male NAD, pale ?Heart:  Regular rate and rhythm; no murmurs ?Lungs: Respirations even and unlabored, lungs CTA bilaterally ?Abdomen:  Soft, nontender and nondistended. Normal bowel sounds. ?Extremities: Left upper extremity with some swelling ?Neurologic:  Alert and oriented,  grossly normal neurologically. ?Psych:  Cooperative. Normal mood and affect. ? ?Intake/Output from previous day: ?05/12 0701 - 05/13 0700 ?In: 816.8 [P.O.:150; I.V.:308.8; Blood:358] ?Out: 400 [Urine:400] ?Intake/Output this shift: ?No intake/output data recorded. ? ?Lab Results: ?Recent Labs  ?  03/26/22 ?2301 03/27/22 ?1049 03/27/22 ?1827 03/27/22 ?2317 03/28/22 ?0336  ?WBC 5.2  --   --   --   --   ?HGB 6.9*   < > 7.9* 7.6* 7.0*  ?HCT 21.1*   < > 24.1* 23.4* 21.2*  ?PLT 382  --   --   --   --   ? < > = values in this interval not displayed.  ? ?BMET ?Recent Labs  ?  03/26/22 ?2301 03/27/22 ?1049  ?NA 136 136  ?K 4.0 4.2  ?CL 107 102  ?CO2 24  --   ?GLUCOSE 123* 104*  ?BUN 29* 34*  ?CREATININE 0.84 0.80  ?CALCIUM 8.0*  --   ? ?LFT ?Recent Labs  ?  03/26/22 ?2301   ?PROT 5.0*  ?ALBUMIN 2.5*  ?AST 17  ?ALT 27  ?ALKPHOS 48  ?BILITOT 0.5  ? ?PT/INR ?No results for input(s): LABPROT, INR in the last 72 hours. ? ?Studies/Results: ?VAS [place] UPPER EXTREMITY VENOUS DUPLEX ? ?Result Date: 03/27/2022 ?UPPER VENOUS STUDY  Patient Name:  IGNAZIO MOSCONE  Date of Exam:   03/27/2022 Medical Rec #: [id number]        Accession #:    [id number] Date of Birth: 1966-04-26        Patient Gender: M Patient Age:   34 years Exam Location:  [facility] Procedure:      VAS [place] UPPER EXTREMITY VENOUS DUPLEX Referring Phys: Marzetta Board --------------------------------------------------------------------------------  Indications: Edema, and Palpable Cord Comparison Study: No prior study Performing Technologist: Maudry Mayhew MHA, RDMS, RVT, RDCS  Examination Guidelines: A complete evaluation includes B-mode imaging, spectral Doppler, color Doppler, and power Doppler as needed of all accessible portions of each vessel. Bilateral testing is considered an integral part of a complete examination. Limited examinations for reoccurring indications may be performed as noted.  Right Findings: +----------+------------+---------+-----------+----------+-------+ RIGHT     CompressiblePhasicitySpontaneousPropertiesSummary +----------+------------+---------+-----------+----------+-------+ Subclavian               Yes       Yes                      +----------+------------+---------+-----------+----------+-------+  Left Findings: +----------+------------+---------+-----------+----------+-------+ LEFT      CompressiblePhasicitySpontaneousPropertiesSummary +----------+------------+---------+-----------+----------+-------+ IJV           Full       Yes       Yes                      +----------+------------+---------+-----------+----------+-------+ Subclavian    Full       Yes       Yes                      +----------+------------+---------+-----------+----------+-------+  Axillary      Full       Yes       Yes                      +----------+------------+---------+-----------+----------+-------+ Brachial      Full       Yes       Yes                      +----------+------------+---------+-----------+----------+-------+ Radial        Full                                          +----------+------------+---------+-----------+----------+-------+ Ulnar         Full                                          +----------+------------+---------+-----------+----------+-------+ Cephalic      None                                          +----------+------------+---------+-----------+----------+-------+ Basilic       Full                                          +----------+------------+---------+-----------+----------+-------+  Summary:  Right: No evidence of thrombosis in the subclavian.  Left: No evidence of deep vein thrombosis in the upper extremity. Findings consistent with acute superficial vein thrombosis involving the left cephalic vein.  *See table(s) above for measurements and observations.  Diagnosing physician: Monica Martinez MD Electronically signed by Monica Martinez MD on 03/27/2022 at 4:40:32 PM.    Final    ? ? ? ? Assessment / Plan:   ? ?#22 57 year old white male admitted with GI bleed, melena ? ?EGD yesterday with finding of 2 duodenal ulcers, oozing treated with endoscopic therapy with injection and BiCap ? ?Patient has not had any active bleeding since,, hemoglobin had drifted to 7.0 early this morning, hopefully this is equilibration.  He is receiving transfusion currently ? ?#2 anemia secondary to above-as required multiple units of blood over the past couple of weeks ?#3 status post reconstruction of the right mandible 03/03/2022/done at [facility]-postop infection, on Cleocin ? ?#4 HIV/on therapy undetectable viral load ? ?Plan; clear liquid diet today ?PPI has been converted to 40 mg IV twice daily ?Continue to  watch hemoglobin closely and transfuse to keep hemoglobin 7.5 ?H. pylori IgG pending ? ?If he rebleeds will require IR evaluation/management ? ? ? ? ?  Principal Problem: ?  GI bleed ?Active Problems: ?  Hypertension ?  HIV disease ([place]) ?  ABLA (acute blood loss anemia) ? ? ? ? LOS: 2 days  ? ?Amy Esterwood PA-C 03/28/2022, 9:23 AM ?  ? ?I have taken an interval history, thoroughly reviewed the chart and examined the patient. I agree with the Advanced Practitioner's note, impression and recommendations, and have recorded additional findings, impressions and recommendations below. ?I performed a substantive portion of this encounter (>50% time spent), including a complete performance of the medical decision making. ? ?My additional thoughts are as follows: ? ?Duodenal ulcer with bleeding, underwent endoscopic therapy yesterday. ?Acute blood loss anemia, hemoglobin has decreased from yesterday.  However, he has not passed any bowel movement or blood since the EGD yesterday, so I think he is probably not still bleeding. ? ?Hemodynamically stable, no abdominal pain. ?He does not feel ready to advance been on a liquid diet, primarily because of his jaw pain after recent surgery. ? ?Pantoprazole was changed to 40 mg IV twice daily because of a left upper extremity clot and limited IV access to allow for continuous drip. ? ?He received another PRBC unit today, I recommend a CBC this evening and tomorrow morning.  We will see him tomorrow, please call sooner if needed. ? ? ?Nelida Meuse III ?Office:[phone] ? ?
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
## 2022-03-29 DIAGNOSIS — D62 Acute posthemorrhagic anemia: Secondary | ICD-10-CM | POA: Diagnosis not present

## 2022-03-29 DIAGNOSIS — K264 Chronic or unspecified duodenal ulcer with hemorrhage: Secondary | ICD-10-CM | POA: Diagnosis not present

## 2022-03-29 LAB — CBC
HCT: 24.5 % — ABNORMAL LOW (ref 39.0–52.0)
HCT: 25.1 % — ABNORMAL LOW (ref 39.0–52.0)
Hemoglobin: 7.9 g/dL — ABNORMAL LOW (ref 13.0–17.0)
Hemoglobin: 8.5 g/dL — ABNORMAL LOW (ref 13.0–17.0)
MCH: 29.8 pg (ref 26.0–34.0)
MCH: 30.5 pg (ref 26.0–34.0)
MCHC: 32.2 g/dL (ref 30.0–36.0)
MCHC: 33.9 g/dL (ref 30.0–36.0)
MCV: 92.5 fL (ref 80.0–100.0)
MCV: 90 fL (ref 80.0–100.0)
Platelets: 337 10*3/uL (ref 150–400)
Platelets: 414 10*3/uL — ABNORMAL HIGH (ref 150–400)
RBC: 2.65 MIL/uL — ABNORMAL LOW (ref 4.22–5.81)
RBC: 2.79 MIL/uL — ABNORMAL LOW (ref 4.22–5.81)
RDW: 16.9 % — ABNORMAL HIGH (ref 11.5–15.5)
RDW: 16.6 % — ABNORMAL HIGH (ref 11.5–15.5)
WBC: 4.6 10*3/uL (ref 4.0–10.5)
WBC: 5.4 10*3/uL (ref 4.0–10.5)
nRBC: 0 % (ref 0.0–0.2)
nRBC: 0 % (ref 0.0–0.2)

## 2022-03-29 LAB — TYPE AND SCREEN
ABO/RH(D): A POS
Antibody Screen: NEGATIVE
Unit division: 0
Unit division: 0
Unit division: 0

## 2022-03-29 LAB — BPAM RBC
Blood Product Expiration Date: 202305272359
Blood Product Expiration Date: 202306082359
Blood Product Expiration Date: 202306082359
ISSUE DATE / TIME: 202305120033
ISSUE DATE / TIME: 202305120404
ISSUE DATE / TIME: 202305130853
Unit Type and Rh: 6200
Unit Type and Rh: 6200
Unit Type and Rh: 6200

## 2022-03-29 MED ORDER — HYDROXYZINE HCL 10 MG PO TABS
10.0000 mg | ORAL_TABLET | Freq: Three times a day (TID) | ORAL | Status: DC | PRN
  Filled 2022-03-29: qty 1

## 2022-03-29 MED ORDER — BOOST / RESOURCE BREEZE PO LIQD CUSTOM
1.0000 | Freq: Three times a day (TID) | ORAL | Status: DC
  Administered 2022-03-29: 1 via ORAL
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
## 2022-03-29 NOTE — Progress Notes (Addendum)
Patient ID: Jimmy [name], male   DOB: 26-Aug-1966, 56 y.o.   MRN: [id number] ? ? ? Progress Note ? ? Subjective  ? Day # 3 ? CC; GI bleed ?HGb 7.9 this am (8.3 last PM ) ? ?Protonix IV twice daily ? ?, He looks better, denies any abdominal pain, has been taking clear liquids with no nausea ,has not had any bowel movements, family at bedside ? ? Objective  ? ?Vital signs in last 24 hours: ?Temp:  [98.1 ?F (36.7 ?C)-99.1 ?F (37.3 ?C)] 98.9 ?F (37.2 ?C) (05/14 0347) ?Pulse Rate:  [80-86] 80 (05/14 0347) ?Resp:  [12-16] 16 (05/14 0347) ?BP: (96-108)/(49-64) 99/62 (05/14 0347) ?SpO2:  [95 %-97 %] 97 % (05/14 0347) ?Last BM Date : 03/27/22 ?General:    White male in NAD ?Heart:  Regular rate and rhythm; no murmurs ?Lungs: Respirations even and unlabored, lungs CTA bilaterally ?Abdomen:  Soft, nontender and nondistended. Normal bowel sounds. ?Extremities:  Without edema. ?Neurologic:  Alert and oriented,  grossly normal neurologically. ?Psych:  Cooperative. Normal mood and affect. ? ?Intake/Output from previous day: ?05/13 0701 - 05/14 0700 ?In: 315 [Blood:315] ?Out: 1425 [Urine:1425] ?Intake/Output this shift: ?No intake/output data recorded. ? ?Lab Results: ?Recent Labs  ?  03/26/22 ?2301 03/27/22 ?1049 03/28/22 ?0336 03/28/22 ?1645 03/29/22 ?0350  ?WBC 5.2  --   --  5.9 4.6  ?HGB 6.9*   < > 7.0* 8.3* 7.9*  ?HCT 21.1*   < > 21.2* 23.8* 24.5*  ?PLT 382  --   --  330 337  ? < > = values in this interval not displayed.  ? ?BMET ?Recent Labs  ?  03/26/22 ?2301 03/27/22 ?1049  ?NA 136 136  ?K 4.0 4.2  ?CL 107 102  ?CO2 24  --   ?GLUCOSE 123* 104*  ?BUN 29* 34*  ?CREATININE 0.84 0.80  ?CALCIUM 8.0*  --   ? ?LFT ?Recent Labs  ?  03/26/22 ?2301  ?PROT 5.0*  ?ALBUMIN 2.5*  ?AST 17  ?ALT 27  ?ALKPHOS 48  ?BILITOT 0.5  ? ?PT/INR ?No results for input(s): LABPROT, INR in the last 72 hours. ? ?Studies/Results: ?VAS [place] UPPER EXTREMITY VENOUS DUPLEX ? ?Result Date: 03/27/2022 ?UPPER VENOUS STUDY  Patient Name:  EMAAD NANNA  Date of  Exam:   03/27/2022 Medical Rec #: [id number]        Accession #:    [id number] Date of Birth: 1966-09-01        Patient Gender: M Patient Age:   40 years Exam Location:  [facility] Procedure:      VAS [place] UPPER EXTREMITY VENOUS DUPLEX Referring Phys: Pamella Pert --------------------------------------------------------------------------------  Indications: Edema, and Palpable Cord Comparison Study: No prior study Performing Technologist: Gertie Fey MHA, RDMS, RVT, RDCS  Examination Guidelines: A complete evaluation includes B-mode imaging, spectral Doppler, color Doppler, and power Doppler as needed of all accessible portions of each vessel. Bilateral testing is considered an integral part of a complete examination. Limited examinations for reoccurring indications may be performed as noted.  Right Findings: +----------+------------+---------+-----------+----------+-------+ RIGHT     CompressiblePhasicitySpontaneousPropertiesSummary +----------+------------+---------+-----------+----------+-------+ Subclavian               Yes       Yes                      +----------+------------+---------+-----------+----------+-------+  Left Findings: +----------+------------+---------+-----------+----------+-------+ LEFT      CompressiblePhasicitySpontaneousPropertiesSummary +----------+------------+---------+-----------+----------+-------+ IJV  Full       Yes       Yes                      +----------+------------+---------+-----------+----------+-------+ Subclavian    Full       Yes       Yes                      +----------+------------+---------+-----------+----------+-------+ Axillary      Full       Yes       Yes                      +----------+------------+---------+-----------+----------+-------+ Brachial      Full       Yes       Yes                      +----------+------------+---------+-----------+----------+-------+ Radial        Full                                           +----------+------------+---------+-----------+----------+-------+ Ulnar         Full                                          +----------+------------+---------+-----------+----------+-------+ Cephalic      None                                          +----------+------------+---------+-----------+----------+-------+ Basilic       Full                                          +----------+------------+---------+-----------+----------+-------+  Summary:  Right: No evidence of thrombosis in the subclavian.  Left: No evidence of deep vein thrombosis in the upper extremity. Findings consistent with acute superficial vein thrombosis involving the left cephalic vein.  *See table(s) above for measurements and observations.  Diagnosing physician: Sherald Hess MD Electronically signed by Sherald Hess MD on 03/27/2022 at 4:40:32 PM.    Final    ? ? ? ? Assessment / Plan:   ? ?#83  56 year old white male admitted with GI bleed/melena ?EGD on 03/27/2022 with finding of 2 duodenal ulcers, oozing and treated with endoscopic therapy with injection and BiCap ? ?Patient has been stable since without any active bleeding, no stools.  He has required multiple units of packed RBCs since initial onset of bleeding a couple of weeks ago prior to this admission. ? ?He has not required transfusion over the past 24 hours ? ?#2 status post reconstruction of the right mandible/03/03/2022 done at [facility], complicated by postop infection, has been on Cleocin ?#3 HIV, on therapy undetectable viral load ? ? ?Plan; advance to full liquids today ?Continue IV PPI twice daily today, no further bleeding can convert to oral Protonix 40 mg p.o. twice daily tomorrow ?Need to be discharged on Protonix 40 mg p.o. twice daily ?H. pylori IgG still pending treat if positive ? ? ? ?Principal Problem: ?  GI  bleed ?Active Problems: ?  Hypertension ?  HIV disease (HCC) ?  ABLA (acute blood  loss anemia) ? ? ? ? LOS: 3 days  ? ?Amy Esterwood PA-C 03/29/2022, 11:52 AM ? ?I have taken an interval history, thoroughly reviewed the chart and examined the patient. I agree with the Advanced Practitioner's note, impression and recommendations, and have recorded additional findings, impressions and recommendations below. ?I performed a substantive portion of this encounter (>50% time spent), including a complete performance of the medical decision making. ? ?My additional thoughts are as follows: ? ?His bleeding has stopped, hemoglobin is stable. ?He wishes to stay on his usual diet of subsisting on boost because of his chronic jaw problem.  I am agreeable to that if that is what he feels is best.  If he should feel inclined to take a soft consistency diet, that would be acceptable as well. ? ?He needs a twice daily oral PPI ? ?Our service will check on him tomorrow, but from my perspective if he remains stable he can be discharged tomorrow from my perspective. ?Our service will arrange an office appointment for him before discharge so that he can be given with his discharge paperwork. ? ?We will follow-up on his H. pylori serology. ? ?Thank you for allowing us to participate in his care. ?Charlie PitterHenry L Danis III ?Office:[phone] ? ?  ?

## 2022-03-30 ENCOUNTER — Encounter (HOSPITAL_COMMUNITY): Payer: Self-pay | Admitting: Gastroenterology

## 2022-03-30 ENCOUNTER — Inpatient Hospital Stay (HOSPITAL_COMMUNITY): Payer: BC Managed Care – PPO

## 2022-03-30 DIAGNOSIS — K269 Duodenal ulcer, unspecified as acute or chronic, without hemorrhage or perforation: Secondary | ICD-10-CM

## 2022-03-30 DIAGNOSIS — I82621 Acute embolism and thrombosis of deep veins of right upper extremity: Secondary | ICD-10-CM

## 2022-03-30 DIAGNOSIS — R609 Edema, unspecified: Secondary | ICD-10-CM

## 2022-03-30 LAB — H. PYLORI ANTIBODY, IGG: H Pylori IgG: 0.1 Index Value (ref 0.00–0.79)

## 2022-03-30 LAB — CBC
HCT: 26.8 % — ABNORMAL LOW (ref 39.0–52.0)
Hemoglobin: 8.9 g/dL — ABNORMAL LOW (ref 13.0–17.0)
MCH: 30.1 pg (ref 26.0–34.0)
MCHC: 33.2 g/dL (ref 30.0–36.0)
MCV: 90.5 fL (ref 80.0–100.0)
Platelets: 399 10*3/uL (ref 150–400)
RBC: 2.96 MIL/uL — ABNORMAL LOW (ref 4.22–5.81)
RDW: 16.7 % — ABNORMAL HIGH (ref 11.5–15.5)
WBC: 5 10*3/uL (ref 4.0–10.5)
nRBC: 0 % (ref 0.0–0.2)

## 2022-03-30 LAB — BASIC METABOLIC PANEL
Anion gap: 10 (ref 5–15)
BUN: 14 mg/dL (ref 6–20)
CO2: 24 mmol/L (ref 22–32)
Calcium: 8.4 mg/dL — ABNORMAL LOW (ref 8.9–10.3)
Chloride: 99 mmol/L (ref 98–111)
Creatinine, Ser: 0.95 mg/dL (ref 0.61–1.24)
GFR, Estimated: >60 mL/min (ref >60)
Glucose, Bld: 92 mg/dL (ref 70–99)
Potassium: 3.8 mmol/L (ref 3.5–5.1)
Sodium: 133 mmol/L — ABNORMAL LOW (ref 135–145)

## 2022-03-30 LAB — MAGNESIUM: Magnesium: 1.8 mg/dL (ref 1.7–2.4)

## 2022-03-30 MED ORDER — APIXABAN 5 MG PO TABS: 5.0000 mg | ORAL_TABLET | Freq: Two times a day (BID) | ORAL | 0 refills | Status: DC

## 2022-03-30 MED ORDER — PANTOPRAZOLE SODIUM 40 MG PO TBEC: 40.0000 mg | DELAYED_RELEASE_TABLET | Freq: Two times a day (BID) | ORAL | 1 refills | Status: DC
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
## 2022-03-30 NOTE — Progress Notes (Signed)
Right upper extremity venous duplex completed. ?Refer to "CV Proc" under chart review to view preliminary results. ? ?Preliminary results discussed with Dr. Elvera Lennox. ? ?03/30/2022 12:12 PM ?Eula Fried., MHA, RVT, RDCS, RDMS   ?
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
## 2022-03-30 NOTE — TOC Transition Note (Signed)
Transition of Care (TOC) - CM/SW Discharge Note ?Donn Pierini Charity fundraiser, BSN ?Transitions of Care ?Unit 4E- RN Case Manager ?See Treatment Team for direct phone #  ? ? ?Patient Details  ?Name: Jimmy [name] ?MRN: [id number] ?Date of Birth: Dec 16, 1965 ? ?Transition of Care (TOC) CM/SW Contact:  ?Zenda Alpers, Lenn Sink, RN ?Phone Number: ?03/30/2022, 4:29 PM ? ? ?Clinical Narrative:    ?Pt stable for transition home today, has been started on Eliquis. CM provided pt with Eliquis cards (30 day free trail and copay assist)- provided info on how to use cards and insurance coverage. Pt/wife voiced understanding and will f/u with pharmacy to see what patient's copay under his drug plan will be for next month.  ?No further TOC needs noted. Wife to provide transportation home.  ? ? ?Final next level of care: Home/Self Care ?Barriers to Discharge: No Barriers Identified ? ? ?Patient Goals and CMS Choice ?Patient states their goals for this hospitalization and ongoing recovery are:: return home ?  ?Choice offered to / list presented to : NA ? ?Discharge Placement ?  ?           ?  ? Home ?  ?  ? ?Discharge Plan and Services ?In-house Referral: NA ?Discharge Planning Services: CM Consult, Medication Assistance (Eliquis cards provided) ?Post Acute Care Choice: NA          ?DME Arranged: N/A ?DME Agency: NA ?  ?  ?  ?HH Arranged: NA ?HH Agency: NA ?  ?  ?  ? ?Social Determinants of Health (SDOH) Interventions ?  ? ? ?Readmission Risk Interventions ? ?  03/30/2022  ?  4:29 PM  ?Readmission Risk Prevention Plan  ?Post Dischage Appt Complete  ?Medication Screening Complete  ?Transportation Screening Complete  ? ? ? ? ? ?

## 2022-04-15 ENCOUNTER — Other Ambulatory Visit: Payer: Self-pay | Admitting: Cardiology

## 2022-04-23 ENCOUNTER — Other Ambulatory Visit: Payer: Self-pay | Admitting: Cardiology

## 2022-04-24 ENCOUNTER — Other Ambulatory Visit (HOSPITAL_COMMUNITY): Payer: Self-pay | Admitting: General Surgery

## 2022-04-24 ENCOUNTER — Ambulatory Visit (HOSPITAL_COMMUNITY)
Admission: RE | Admit: 2022-04-24 | Discharge: 2022-04-24 | Disposition: A | Discharge status: Home / Self Care | Payer: BC Managed Care – PPO | Source: Ambulatory Visit | Attending: General Surgery | Admitting: General Surgery

## 2022-04-24 ENCOUNTER — Encounter (HOSPITAL_BASED_OUTPATIENT_CLINIC_OR_DEPARTMENT_OTHER): Payer: BC Managed Care – PPO | Attending: General Surgery | Admitting: General Surgery

## 2022-04-24 DIAGNOSIS — M269 Dentofacial anomaly, unspecified: Secondary | ICD-10-CM | POA: Insufficient documentation

## 2022-04-24 DIAGNOSIS — M7989 Other specified soft tissue disorders: Secondary | ICD-10-CM | POA: Diagnosis not present

## 2022-04-24 DIAGNOSIS — M8660 Other chronic osteomyelitis, unspecified site: Secondary | ICD-10-CM | POA: Insufficient documentation

## 2022-04-24 DIAGNOSIS — K260 Acute duodenal ulcer with hemorrhage: Secondary | ICD-10-CM | POA: Insufficient documentation

## 2022-04-24 DIAGNOSIS — B999 Unspecified infectious disease: Secondary | ICD-10-CM | POA: Diagnosis not present

## 2022-04-24 DIAGNOSIS — S02601A Fracture of unspecified part of body of right mandible, initial encounter for closed fracture: Secondary | ICD-10-CM | POA: Diagnosis not present

## 2022-04-24 NOTE — Progress Notes (Signed)
PARK, SUDHOFF ([id number]) Visit Report for 04/24/2022 Allergy List Details Patient Name: Date of Service: [name] 04/24/2022 8:00 A M Medical Record Number: [id number] Patient Account Number: [id number] Date of Birth/Sex: Treating RN: June 25, 1966 (56 y.o. Collene Gobble Primary Care Saed Hudlow: Greig Right Other Clinician: Referring Caycee Wanat: Treating Nattalie Santiesteban/Extender: Tonette Lederer in Treatment: 0 Allergies Active Allergies penicillin kiwi Allergy Notes Electronic Signature(s) Signed: 04/24/2022 5:54:49 PM By: Dellie Catholic RN Entered By: Dellie Catholic on 04/24/2022 08:05:32 -------------------------------------------------------------------------------- Arrival Information Details Patient Name: Date of Service: [name] 04/24/2022 8:00 A M Medical Record Number: [id number] Patient Account Number: [id number] Date of Birth/Sex: Treating RN: Jul 04, 1966 (56 y.o. Collene Gobble Primary Care Irina Okelly: Greig Right Other Clinician: Referring Siah Kannan: Treating [place] Lasseter/Extender: Tonette Lederer in Treatment: 0 Visit Information Patient Arrived: Ambulatory Arrival Time: 08:01 Accompanied By: spouse Transfer Assistance: None Patient Identification Verified: Yes Electronic Signature(s) Signed: 04/24/2022 5:54:49 PM By: Dellie Catholic RN Entered By: Dellie Catholic on 04/24/2022 08:03:33 -------------------------------------------------------------------------------- Clinic Level of Care Assessment Details Patient Name: Date of Service: Romie Levee. 04/24/2022 8:00 A M Medical Record Number: [id number] Patient Account Number: [id number] Date of Birth/Sex: Treating RN: Jun 05, 1966 (56 y.o. Collene Gobble Primary Care Korde Jeppsen: Other Clinician: Greig Right Referring Nelton Amsden: Treating Alee Gressman/Extender: Tonette Lederer in Treatment: 0 Clinic Level of Care Assessment  Items TOOL 4 Quantity Score X- 1 0 Use when only an EandM is performed on FOLLOW-UP visit ASSESSMENTS - Nursing Assessment / Reassessment X- 1 10 Reassessment of Co-morbidities (includes updates in patient status) X- 1 5 Reassessment of Adherence to Treatment Plan ASSESSMENTS - Wound and Skin A ssessment / Reassessment []  - 0 Simple Wound Assessment / Reassessment - one wound []  - 0 Complex Wound Assessment / Reassessment - multiple wounds []  - 0 Dermatologic / Skin Assessment (not related to wound area) ASSESSMENTS - Focused Assessment []  - 0 Circumferential Edema Measurements - multi extremities X- 1 10 Nutritional Assessment / Counseling / Intervention []  - 0 Lower Extremity Assessment (monofilament, tuning fork, pulses) []  - 0 Peripheral Arterial Disease Assessment (using hand held doppler) ASSESSMENTS - Ostomy and/or Continence Assessment and Care []  - 0 Incontinence Assessment and Management []  - 0 Ostomy Care Assessment and Management (repouching, etc.) PROCESS - Coordination of Care []  - 0 Simple Patient / Family Education for ongoing care X- 1 20 Complex (extensive) Patient / Family Education for ongoing care X- 1 10 Staff obtains Programmer, systems, Records, T Results / Process Orders est X- 1 10 Staff telephones HHA, Nursing Homes / Clarify orders / etc []  - 0 Routine Transfer to another Facility (non-emergent condition) []  - 0 Routine Hospital Admission (non-emergent condition) X- 1 15 New Admissions / Biomedical engineer / Ordering NPWT Apligraf, etc. , []  - 0 Emergency Hospital Admission (emergent condition) []  - 0 Simple Discharge Coordination X- 1 15 Complex (extensive) Discharge Coordination PROCESS - Special Needs []  - 0 Pediatric / Minor Patient Management []  - 0 Isolation Patient Management []  - 0 Hearing / Language / Visual special needs []  - 0 Assessment of Community assistance (transportation, D/C planning, etc.) []  - 0 Additional  assistance / Altered mentation []  - 0 Support Surface(s) Assessment (bed, cushion, seat, etc.) INTERVENTIONS - Wound Cleansing / Measurement []  - 0 Simple Wound Cleansing - one wound []  - 0 Complex Wound Cleansing - multiple wounds []  - 0 Wound Imaging (photographs - any number of wounds) []  -  0 Wound Tracing (instead of photographs) []  - 0 Simple Wound Measurement - one wound []  - 0 Complex Wound Measurement - multiple wounds INTERVENTIONS - Wound Dressings []  - 0 Small Wound Dressing one or multiple wounds []  - 0 Medium Wound Dressing one or multiple wounds []  - 0 Large Wound Dressing one or multiple wounds []  - 0 Application of Medications - topical []  - 0 Application of Medications - injection INTERVENTIONS - Miscellaneous []  - 0 External ear exam []  - 0 Specimen Collection (cultures, biopsies, blood, body fluids, etc.) []  - 0 Specimen(s) / Culture(s) sent or taken to Lab for analysis []  - 0 Patient Transfer (multiple staff / Civil Service fast streamer / Similar devices) []  - 0 Simple Staple / Suture removal (25 or less) []  - 0 Complex Staple / Suture removal (26 or more) []  - 0 Hypo / Hyperglycemic Management (close monitor of Blood Glucose) []  - 0 Ankle / Brachial Index (ABI) - do not check if billed separately X- 1 5 Vital Signs Has the patient been seen at the hospital within the last three years: Yes Total Score: 100 Level Of Care: New/Established - Level 3 Electronic Signature(s) Signed: 04/24/2022 5:54:49 PM By: Dellie Catholic RN Entered By: Dellie Catholic on 04/24/2022 17:47:13 -------------------------------------------------------------------------------- Encounter Discharge Information Details Patient Name: Date of Service: [name], [name] 04/24/2022 8:00 A M Medical Record Number: [id number] Patient Account Number: [id number] Date of Birth/Sex: Treating RN: [birth year] (56 y.o. Collene Gobble Primary Care Korey Prashad: Greig Right Other  Clinician: Referring Alisi Lupien: Treating Savyon Loken/Extender: Tonette Lederer in Treatment: 0 Encounter Discharge Information Items Discharge Condition: Stable Ambulatory Status: Ambulatory Discharge Destination: Home Transportation: Private Auto Accompanied By: spouse Schedule Follow-up Appointment: Yes Clinical Summary of Care: Patient Declined Electronic Signature(s) Signed: 04/24/2022 5:54:49 PM By: Dellie Catholic RN Entered By: Dellie Catholic on 04/24/2022 17:48:41 -------------------------------------------------------------------------------- Lower Extremity Assessment Details Patient Name: Date of Service: [name] 04/24/2022 8:00 A M Medical Record Number: [id number] Patient Account Number: [id number] Date of Birth/Sex: Treating RN: 09/26/1966 (56 y.o. Collene Gobble Primary Care Clova Morlock: Greig Right Other Clinician: Referring Jazsmin Couse: Treating Nobel Brar/Extender: Tonette Lederer in Treatment: 0 Electronic Signature(s) Signed: 04/24/2022 5:54:49 PM By: Dellie Catholic RN Entered By: Dellie Catholic on 04/24/2022 [id number] -------------------------------------------------------------------------------- Multi Wound Chart Details Patient Name: Date of Service: [name] 04/24/2022 8:00 A M Medical Record Number: [id number] Patient Account Number: [id number] Date of Birth/Sex: Treating RN: 05/13/1966 (56 y.o. Collene Gobble Primary Care Tag Wurtz: Greig Right Other Clinician: Referring George Alcantar: Treating Bob Daversa/Extender: Tonette Lederer in Treatment: 0 Vital Signs Height(in): 71 Pulse(bpm): 84 Weight(lbs): 190 Blood Pressure(mmHg): 125/73 Body Mass Index(BMI): 26.5 Temperature(F): 98.9 Respiratory Rate(breaths/min): 16 Wound Assessments Treatment Notes Electronic Signature(s) Signed: 04/24/2022 9:28:13 AM By: Fredirick Maudlin MD FACS Signed: 04/24/2022 5:54:49 PM  By: Dellie Catholic RN Entered By: Fredirick Maudlin on 04/24/2022 09:28:13 -------------------------------------------------------------------------------- Multi-Disciplinary Care Plan Details Patient Name: Date of Service: [name], [name] 04/24/2022 8:00 A M Medical Record Number: [id number] Patient Account Number: [id number] Date of Birth/Sex: Treating RN: 1966/08/16 (56 y.o. Collene Gobble Primary Care Kandy Towery: Greig Right Other Clinician: Referring Hassan Blackshire: Treating Jazir Newey/Extender: Tonette Lederer in Treatment: 0 Active Inactive Wound/Skin Impairment Nursing Diagnoses: Impaired tissue integrity Goals: Patient/caregiver will verbalize understanding of skin care regimen Date Initiated: 04/24/2022 Target Resolution Date: 06/12/2022 Goal Status: Active Interventions: Provide education on ulcer and skin care Treatment Activities: Skin care regimen initiated :  04/24/2022 Notes: Electronic Signature(s) Signed: 04/24/2022 5:54:49 PM By: Dellie Catholic RN Entered By: Dellie Catholic on 04/24/2022 17:44:23 -------------------------------------------------------------------------------- Pain Assessment Details Patient Name: Date of Service: [name] 04/24/2022 8:00 A M Medical Record Number: [id number] Patient Account Number: [id number] Date of Birth/Sex: Treating RN: [birth year] (56 y.o. Collene Gobble Primary Care Kostantinos Tallman: Greig Right Other Clinician: Referring Bethani Brugger: Treating Sloka Volante/Extender: Tonette Lederer in Treatment: 0 Active Problems Location of Pain Severity and Description of Pain Patient Has Paino Yes Site Locations Pain Location: Pain in Ulcers With Dressing Change: No Duration of the Pain. Constant / Intermittento Constant Rate the pain. Current Pain Level: 5 Worst Pain Level: 10 Least Pain Level: 5 Tolerable Pain Level: 5 Character of Pain Describe the Pain: Difficult to  [place]point Pain Management and Medication Current Pain Management: Medication: Yes Cold Application: No Rest: Yes Massage: No Activity: No T.E.N.S.: No Heat Application: No Leg drop or elevation: No Is the Current Pain Management Adequate: Adequate How does your wound impact your activities of daily livingo Sleep: No Bathing: No Appetite: No Relationship With Others: No Bladder Continence: No Emotions: No Bowel Continence: No Work: No Toileting: No Drive: No Dressing: No Hobbies: No Electronic Signature(s) Signed: 04/24/2022 5:54:49 PM By: Dellie Catholic RN Entered By: Dellie Catholic on 04/24/2022 08:29:37 -------------------------------------------------------------------------------- Patient/Caregiver Education Details Patient Name: Date of Service: Romie Levee 6/9/2023andnbsp8:00 A M Medical Record Number: [id number] Patient Account Number: [id number] Date of Birth/Gender: Treating RN: March 10, 1966 (56 y.o. Collene Gobble Primary Care Physician: Greig Right Other Clinician: Referring Physician: Treating Physician/Extender: Tonette Lederer in Treatment: 0 Education Assessment Education Provided To: Patient Education Topics Provided Wound/Skin Impairment: Methods: Explain/Verbal Responses: Return demonstration correctly Electronic Signature(s) Signed: 04/24/2022 5:54:49 PM By: Dellie Catholic RN Entered By: Dellie Catholic on 04/24/2022 17:44:32 -------------------------------------------------------------------------------- [place] Details Patient Name: Date of Service: [name] 04/24/2022 8:00 A M Medical Record Number: [id number] Patient Account Number: [id number] Date of Birth/Sex: Treating RN: 1966-02-10 (57 y.o. Collene Gobble Primary Care Ece Cumberland: Greig Right Other Clinician: Referring Ileigh Mettler: Treating Sanjiv Castorena/Extender: Tonette Lederer in Treatment: 0 Vital Signs Time  Taken: 08:04 Temperature (F): 98.9 Height (in): 71 Pulse (bpm): 84 Weight (lbs): 190 Respiratory Rate (breaths/min): 16 Body Mass Index (BMI): 26.5 Blood Pressure (mmHg): 125/73 Reference Range: 80 - 120 mg / dl Electronic Signature(s) Signed: 04/24/2022 5:54:49 PM By: Dellie Catholic RN Entered By: Dellie Catholic on 04/24/2022 08:05:01

## 2022-04-24 NOTE — Progress Notes (Signed)
Jimmy, [name] ([id number]) Visit Report for 04/24/2022 Abuse Risk Screen Details Patient Name: Date of Service: Jimmy [name]. 04/24/2022 8:00 A M Medical Record Number: [id number] Patient Account Number: [id number] Date of Birth/Sex: Treating RN: [birth year] (56 y.o. Collene Gobble Primary Care Arnett Duddy: Greig Right Other Clinician: Referring Clayvon Parlett: Treating Jenniger Figiel/Extender: Tonette Lederer in Treatment: 0 Abuse Risk Screen Items Answer ABUSE RISK SCREEN: Has anyone close to you tried to hurt or harm you recentlyo No Do you feel uncomfortable with anyone in your familyo No Has anyone forced you do things that you didnt want to doo No Electronic Signature(s) Signed: 04/24/2022 5:54:49 PM By: Dellie Catholic RN Entered By: Dellie Catholic on 04/24/2022 08:26:04 -------------------------------------------------------------------------------- Activities of Daily Living Details Patient Name: Date of Service: Jimmy Guise [name] 04/24/2022 8:00 A M Medical Record Number: [id number] Patient Account Number: [id number] Date of Birth/Sex: Treating RN: 01/15/1966 (56 y.o. Collene Gobble Primary Care Aundre Hietala: Greig Right Other Clinician: Referring Justeen Hehr: Treating Kooper Godshall/Extender: Tonette Lederer in Treatment: 0 Activities of Daily Living Items Answer Activities of Daily Living (Please select one for each item) Drive Automobile Completely Able T Medications ake Completely Able Use T elephone Completely Able Care for Appearance Completely Able Use T oilet Completely Able Bath / Shower Completely Able Dress Self Completely Able Feed Self Completely Able Walk Completely Able Get In / Out Bed Completely Able Housework Completely Able Prepare Meals Completely [place] Completely Able Shop for Self Completely Able Electronic Signature(s) Signed: 04/24/2022 5:54:49 PM By: Dellie Catholic RN Entered By: Dellie Catholic on 04/24/2022 08:27:11 -------------------------------------------------------------------------------- Education Screening Details Patient Name: Date of Service: Jimmy Guise [name] 04/24/2022 8:00 A M Medical Record Number: [id number] Patient Account Number: [id number] Date of Birth/Sex: Treating RN: 1966/01/03 (56 y.o. Collene Gobble Primary Care Laelia Angelo: Greig Right Other Clinician: Referring Winslow Verrill: Treating Billee Balcerzak/Extender: Tonette Lederer in Treatment: 0 Primary Learner Assessed: Patient Learning Preferences/Education Level/Primary Language Learning Preference: Explanation, Demonstration, Printed Material Highest Education Level: High School Preferred Language: English Cognitive Barrier Language Barrier: No Translator Needed: No Memory Deficit: No Emotional Barrier: No Cultural/Religious Beliefs Affecting Medical Care: No Physical Barrier Impaired Vision: No Impaired Hearing: No Decreased Hand dexterity: No Knowledge/Comprehension Knowledge Level: High Comprehension Level: High Ability to understand written instructions: High Ability to understand verbal instructions: High Motivation Anxiety Level: Calm Cooperation: Cooperative Education Importance: Acknowledges Need Interest in Health Problems: Asks Questions Perception: Coherent Willingness to Engage in Self-Management High Activities: Readiness to Engage in Self-Management High Activities: Electronic Signature(s) Signed: 04/24/2022 5:54:49 PM By: Dellie Catholic RN Entered By: Dellie Catholic on 04/24/2022 08:27:52 -------------------------------------------------------------------------------- Fall Risk Assessment Details Patient Name: Date of Service: DA Jimmy [name], [place] [name] 04/24/2022 8:00 A M Medical Record Number: [id number] Patient Account Number: [id number] Date of Birth/Sex: Treating RN: 01-05-1966 (56 y.o. Collene Gobble Primary Care Jimmy [name]: Greig Right Other Clinician: Referring Massiah Longanecker: Treating Jimmy [name]/Extender: Tonette Lederer in Treatment: 0 Fall Risk Assessment Items Have you had 2 or more falls in the last 12 monthso 0 No Have you had any fall that resulted in injury in the last 12 monthso 0 No FALLS RISK SCREEN History of falling - immediate or within 3 months 0 No Secondary diagnosis (Do you have 2 or more medical diagnoseso) 0 No Ambulatory aid None/bed rest/wheelchair/nurse 0 No Crutches/cane/walker 0 No Furniture 0 No Intravenous therapy Access/Saline/Heparin Lock 0 No Gait/Transferring Normal/ bed rest/ wheelchair 0  No Weak (short steps with or without shuffle, stooped but able to lift head while walking, may seek 0 No support from furniture) Impaired (short steps with shuffle, may have difficulty arising from chair, head down, impaired 0 No balance) Mental Status Oriented to own ability 0 No Electronic Signature(s) Signed: 04/24/2022 5:54:49 PM By: Dellie Catholic RN Entered By: Dellie Catholic on 04/24/2022 08:27:58 -------------------------------------------------------------------------------- Foot Assessment Details Patient Name: Date of Service: Jimmy Guise [name] 04/24/2022 8:00 A M Medical Record Number: [id number] Patient Account Number: [id number] Date of Birth/Sex: Treating RN: [birth year] (56 y.o. Collene Gobble Primary Care Kutler Vanvranken: Greig Right Other Clinician: Referring Ibeth Fahmy: Treating Krista Godsil/Extender: Tonette Lederer in Treatment: 0 Foot Assessment Items Site Locations + = Sensation present, - = Sensation absent, C = Callus, U = Ulcer R = Redness, W = Warmth, M = Maceration, PU = Pre-ulcerative lesion F = Fissure, S = Swelling, D = Dryness Assessment Right: Left: Other Deformity: No No Prior Foot Ulcer: No No Prior Amputation: No No Charcot Joint: No No Ambulatory Status: Ambulatory Without Help Gait: Steady Electronic  Signature(s) Signed: 04/24/2022 5:54:49 PM By: Dellie Catholic RN Entered By: Dellie Catholic on 04/24/2022 08:28:43 -------------------------------------------------------------------------------- Nutrition Risk Screening Details Patient Name: Date of Service: Jimmy Guise [name] 04/24/2022 8:00 A M Medical Record Number: [id number] Patient Account Number: [id number] Date of Birth/Sex: Treating RN: December 24, 1965 (56 y.o. Collene Gobble Primary Care Kaelan Emami: Greig Right Other Clinician: Referring Icelyn Navarrete: Treating Oseas Detty/Extender: Tonette Lederer in Treatment: 0 Height (in): 71 Weight (lbs): 190 Body Mass Index (BMI): 26.5 Nutrition Risk Screening Items Score Screening NUTRITION RISK SCREEN: I have an illness or condition that made me change the kind and/or amount of food I eat 0 No I eat fewer than two meals per day 0 No I eat few fruits and vegetables, or milk products 0 No I have three or more drinks of beer, liquor or wine almost every day 0 No I have tooth or mouth problems that make it hard for me to eat 0 No I don't always have enough money to buy the food I need 0 No I eat alone most of the time 0 No I take three or more different prescribed or over-the-counter drugs a day 0 No Without wanting to, I have lost or gained 10 pounds in the last six months 0 No I am not always physically able to shop, cook and/or feed myself 0 No Nutrition Protocols Good Risk Protocol 0 No interventions needed Moderate Risk Protocol High Risk Proctocol Risk Level: Good Risk Score: 0 Electronic Signature(s) Signed: 04/24/2022 5:54:49 PM By: Dellie Catholic RN Entered By: Dellie Catholic on 04/24/2022 08:28:30
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
## 2022-04-28 ENCOUNTER — Encounter: Payer: Self-pay | Admitting: Infectious Disease

## 2022-04-29 ENCOUNTER — Encounter (INDEPENDENT_AMBULATORY_CARE_PROVIDER_SITE_OTHER): Payer: BC Managed Care – PPO | Admitting: *Deleted

## 2022-04-29 ENCOUNTER — Encounter: Payer: Self-pay | Admitting: Gastroenterology

## 2022-04-29 ENCOUNTER — Ambulatory Visit (INDEPENDENT_AMBULATORY_CARE_PROVIDER_SITE_OTHER): Payer: BC Managed Care – PPO | Admitting: Gastroenterology

## 2022-04-29 ENCOUNTER — Other Ambulatory Visit (INDEPENDENT_AMBULATORY_CARE_PROVIDER_SITE_OTHER): Payer: BC Managed Care – PPO

## 2022-04-29 ENCOUNTER — Other Ambulatory Visit: Payer: Self-pay

## 2022-04-29 VITALS — BP 110/79 | HR 80 | Temp 98.1°F | Wt 195.0 lb

## 2022-04-29 VITALS — BP 110/58 | HR 88 | Ht 71.0 in | Wt 195.1 lb

## 2022-04-29 DIAGNOSIS — D62 Acute posthemorrhagic anemia: Secondary | ICD-10-CM | POA: Diagnosis not present

## 2022-04-29 DIAGNOSIS — K263 Acute duodenal ulcer without hemorrhage or perforation: Secondary | ICD-10-CM

## 2022-04-29 DIAGNOSIS — Z006 Encounter for examination for normal comparison and control in clinical research program: Secondary | ICD-10-CM

## 2022-04-29 LAB — CBC WITH DIFFERENTIAL/PLATELET
Basophils Absolute: 0 10*3/uL (ref 0.0–0.1)
Basophils Relative: 0.4 % (ref 0.0–3.0)
Eosinophils Absolute: 0.2 10*3/uL (ref 0.0–0.7)
Eosinophils Relative: 2.8 % (ref 0.0–5.0)
HCT: 34 % — ABNORMAL LOW (ref 39.0–52.0)
Hemoglobin: 10.9 g/dL — ABNORMAL LOW (ref 13.0–17.0)
Lymphocytes Relative: 24.5 % (ref 12.0–46.0)
Lymphs Abs: 1.7 10*3/uL (ref 0.7–4.0)
MCHC: 32.2 g/dL (ref 30.0–36.0)
MCV: 82.7 fl (ref 78.0–100.0)
Monocytes Absolute: 0.4 10*3/uL (ref 0.1–1.0)
Monocytes Relative: 5.4 % (ref 3.0–12.0)
Neutro Abs: 4.6 10*3/uL (ref 1.4–7.7)
Neutrophils Relative %: 66.9 % (ref 43.0–77.0)
Platelets: 409 10*3/uL — ABNORMAL HIGH (ref 150.0–400.0)
RBC: 4.11 Mil/uL — ABNORMAL LOW (ref 4.22–5.81)
RDW: 16.4 % — ABNORMAL HIGH (ref 11.5–15.5)
WBC: 6.9 10*3/uL (ref 4.0–10.5)

## 2022-04-29 LAB — IBC + FERRITIN
Ferritin: 12.4 ng/mL — ABNORMAL LOW (ref 22.0–322.0)
Iron: 26 ug/dL — ABNORMAL LOW (ref 42–165)
Saturation Ratios: 6.4 % — ABNORMAL LOW (ref 20.0–50.0)
TIBC: 403.2 ug/dL (ref 250.0–450.0)
Transferrin: 288 mg/dL (ref 212.0–360.0)

## 2022-04-29 LAB — VITAMIN B12: Vitamin B-12: 720 pg/mL (ref 211–911)

## 2022-04-29 LAB — FOLATE: Folate: >24.2 ng/mL (ref >5.9)

## 2022-04-29 NOTE — Progress Notes (Signed)
[facility] Progress Note  Chief Complaint: Duodenal ulcer with hemorrhage  Subjective  History: Jimmy [name] was seen in follow-up after hospitalization last month for bleeding duodenal ulcers.  These were believed to be aspirin related, and his inpatient H. pylori IgG antibody was negative.  Aspirin was discontinued and he required institution of [facility] for an upper extremity DVT.  He was recently postop from a mandibular reconstruction surgery done at an outside hospital where he also had GI bleeding with no endoscopic investigation, then admitted to our hospital for ongoing GI bleeding.  Jimmy [name] was here with his wife, and glad to say he has no recurrence of GI bleeding, no abdominal pain nausea or vomiting.  Because of his chronic jaw and neck condition, he subsists on a liquid diet of 10-15 boost supplements per day, which he has done for years.  He has not been taking an iron supplement out of concern it might worsen his constipation since he is also on opioids, he has been on a daily multivitamin.  However, he has had no labs checked since hospital discharge because when they contacted his PCP, they were told he could not be seen until July. He has a chronic poorly healing neck surgical site and will soon start hyperbaric oxygen treatment. He also reports a negative Cologuard test in 2022. ROS: Cardiovascular:  no chest pain Respiratory: no dyspnea  The patient's Past Medical, Family and Social History were reviewed and are on file in the EMR.  Objective:  Med list reviewed  Current Outpatient Medications:    abacavir-dolutegravir-lamiVUDine (TRIUMEQ) 600-50-300 MG tablet, Take 1 tablet by mouth daily. When you cannot take pills you can crush and take w water or applesauce, avoid boost, ensure for 3 hours before and 6 hours after Triumeq, Disp: 30 tablet, Rfl: 11   chlorhexidine (PERIDEX) 0.12 % solution, 15 mLs by Mouth Rinse route See admin instructions. RINSE AND GARGLE 15 ML'S BY  MOUTH OR THROAT FOUR TIMES DAILY FOR 14 DAYS AS DIRECTED, Disp: , Rfl:    diltiazem (CARDIZEM CD) 240 MG 24 hr capsule, Take 1 capsule (240 mg total) by mouth daily., Disp: 90 capsule, Rfl: 1   Multiple Vitamins-Minerals (CENTRUM SILVER 50+MEN) TABS, Take 1 tablet by mouth daily with breakfast., Disp: , Rfl:    oxyCODONE-acetaminophen (PERCOCET/ROXICET) 5-325 MG tablet, Take 1 tablet by mouth as needed., Disp: , Rfl:    pantoprazole (PROTONIX) 40 MG tablet, Take 1 tablet (40 mg total) by mouth 2 (two) times daily before a meal., Disp: 60 tablet, Rfl: 1   rosuvastatin (CRESTOR) 20 MG tablet, Take 1 tablet (20 mg total) by mouth daily., Disp: 90 tablet, Rfl: 1   vitamin B-12 (CYANOCOBALAMIN) 1000 MCG tablet, Take 1,000 mcg by mouth daily., Disp: , Rfl:    apixaban (ELIQUIS) 5 MG TABS tablet, Take 1 tablet (5 mg total) by mouth 2 (two) times daily. (Patient not taking: Reported on 04/29/2022), Disp: 60 tablet, Rfl: 0   nitroGLYCERIN (NITROSTAT) 0.4 MG SL tablet, Place 1 tablet (0.4 mg total) under the tongue every 5 (five) minutes as needed for chest pain., Disp: 90 tablet, Rfl: 3  Jimmy [name] says he stopped taking the Eliquis soon after hospital discharge when the right upper arm swelling resolved  Vital signs in last 24 hrs: Vitals:   04/29/22 0834  BP: (!) 110/58  Pulse: 88   Wt Readings from Last 3 Encounters:  04/29/22 195 lb 2 oz (88.5 kg)  03/26/22 201 lb 11.5 oz (  91.5 kg)  01/13/22 226 lb (102.5 kg)    Physical Exam  Bandage across entire neck  Cardiac: Regular without murmur,  no peripheral edema Pulm: clear to auscultation bilaterally, normal RR and effort noted Abdomen: soft, no tenderness, with active bowel sounds. No guarding or palpable hepatosplenomegaly. Skin; warm and dry, no jaundice or rash.  No pallor  Labs:   Last available hemoglobin 8.9 at the time of hospital discharge on 03/30/2022 ___________________________________________ Radiologic  studies:   ____________________________________________ Other:   _____________________________________________ Assessment & Plan  Assessment: Encounter Diagnoses  Name Primary?   ABLA (acute blood loss anemia) Yes   Acute duodenal ulcer    No recurrence of overt GI bleeding.  Was still anemic at the time of discharge after surgery and GI bleeding.  Has not had any lab follow-up since then.  His aspirin induced ulcers will likely heal soon and he is H. pylori negative.  He also does not have a strong indication to resume aspirin.  Plan: Cbc,iron studies, b12,folate.  If still significantly iron deficient and anemic, he may need IV iron, which he is amenable to. 4 more weeks BID protonix, then stop No follow-up EGD needed   Nelida Meuse III

## 2022-04-29 NOTE — Patient Instructions (Signed)
If you are age 55 or older, your body mass index should be between 23-30. Your Body mass index is 27.21 kg/m. If this is out of the aforementioned range listed, please consider follow up with your Primary Care Provider.  If you are age 63 or younger, your body mass index should be between 19-25. Your Body mass index is 27.21 kg/m. If this is out of the aformentioned range listed, please consider follow up with your Primary Care Provider.   ________________________________________________________  The [facility] providers would like to encourage you to use [facility] to communicate with providers for non-urgent requests or questions.  Due to long hold times on the telephone, sending your provider a message by [facility] may be a faster and more efficient way to get a response.  Please allow 48 business hours for a response.  Please remember that this is for non-urgent requests.  _______________________________________________________  Your provider has requested that you go to the basement level for lab work before leaving today. Press "B" on the elevator. The lab is located at the first door on the left as you exit the elevator.  Due to recent changes in healthcare laws, you may see the results of your imaging and laboratory studies on MyChart before your provider has had a chance to review them.  We understand that in some cases there may be results that are confusing or concerning to you. Not all laboratory results come back in the same time frame and the provider may be waiting for multiple results in order to interpret others.  Please give [place] 48 hours in order for your provider to thoroughly review all the results before contacting the office for clarification of your results.   It was a pleasure to see you today!  Thank you for trusting me with your gastrointestinal care!

## 2022-04-29 NOTE — Research (Signed)
Jimmy [name] was here for his final visit for [phone]. He underwent surgery for reconstruction of his jaw in April and is still having problems with the wound not healing. He had a GI bleed in May and was hospitalized for that. He is going to start hyperbaric oxygen therapy next week to help with healing.

## 2022-04-30 ENCOUNTER — Other Ambulatory Visit: Payer: Self-pay | Admitting: Gastroenterology

## 2022-04-30 DIAGNOSIS — D62 Acute posthemorrhagic anemia: Secondary | ICD-10-CM

## 2022-05-01 ENCOUNTER — Telehealth: Payer: Self-pay | Admitting: Pharmacy Technician

## 2022-05-01 ENCOUNTER — Other Ambulatory Visit: Payer: Self-pay | Admitting: Pharmacy Technician

## 2022-05-01 ENCOUNTER — Encounter: Payer: Self-pay | Admitting: Gastroenterology

## 2022-05-01 DIAGNOSIS — D509 Iron deficiency anemia, unspecified: Secondary | ICD-10-CM

## 2022-05-01 HISTORY — DX: Iron deficiency anemia, unspecified: D50.9

## 2022-05-01 NOTE — Telephone Encounter (Unsigned)
Auth Submission: Pending Payer: [employer]  Medication & CPT/J Code(s) submitted: Feraheme (ferumoxytol) [id number] Route of submission (phone, fax, portal): Portal  Auth type: Buy/Bill Units/visits requested: 510mg  x2 (1020 units) Reference number: [id number] Approval from: *** to *** at [facility] as Buy/Bill   Will update once we receive a response.

## 2022-05-04 ENCOUNTER — Encounter: Payer: Self-pay | Admitting: Gastroenterology

## 2022-05-04 ENCOUNTER — Encounter (HOSPITAL_BASED_OUTPATIENT_CLINIC_OR_DEPARTMENT_OTHER): Payer: BC Managed Care – PPO | Admitting: Internal Medicine

## 2022-05-04 DIAGNOSIS — K260 Acute duodenal ulcer with hemorrhage: Secondary | ICD-10-CM | POA: Diagnosis not present

## 2022-05-04 DIAGNOSIS — M269 Dentofacial anomaly, unspecified: Secondary | ICD-10-CM | POA: Diagnosis not present

## 2022-05-04 DIAGNOSIS — Z87828 Personal history of other (healed) physical injury and trauma: Secondary | ICD-10-CM | POA: Diagnosis not present

## 2022-05-04 DIAGNOSIS — M8668 Other chronic osteomyelitis, other site: Secondary | ICD-10-CM | POA: Diagnosis not present

## 2022-05-04 DIAGNOSIS — M8660 Other chronic osteomyelitis, unspecified site: Secondary | ICD-10-CM | POA: Diagnosis not present

## 2022-05-04 DIAGNOSIS — Z21 Asymptomatic human immunodeficiency virus [HIV] infection status: Secondary | ICD-10-CM | POA: Diagnosis not present

## 2022-05-04 NOTE — Progress Notes (Addendum)
Jimmy, [name] ([id number]) Visit Report for 05/04/2022 Arrival Information Details Patient Name: Date of Service: [name] 05/04/2022 10:00 A M Medical Record Number: [id number] Patient Account Number: [id number] Date of Birth/Sex: Treating RN: 06-20-1966 (56 y.o. [name] Primary Care Jimmy [name]: [name] Other Clinician: [name] Referring Gina Costilla: Treating Jimmy [name]/Extender: Jimmy [name] in Treatment: 1 Visit Information History Since Last Visit All ordered tests and consults were completed: Yes Patient Arrived: Ambulatory Any new allergies or adverse reactions: No Arrival Time: 09:05 Had a fall or experienced change in No Accompanied By: Wife activities of daily living that may affect Transfer Assistance: None risk of falls: Patient Identification Verified: Yes Signs or symptoms of abuse/neglect since last visito No Secondary Verification Process Completed: Yes Hospitalized since last visit: No Patient Requires Transmission-Based Precautions: No Implantable device outside of the clinic excluding No Patient Has Alerts: No cellular tissue based products placed in the center since last visit: Pain Present Now: No Electronic Signature(s) Signed: 05/04/2022 4:20:35 PM By: [name] EMT Entered By: [name] on 05/04/2022 16:20:34 -------------------------------------------------------------------------------- Encounter Discharge Information Details Patient Name: Date of Service: Jimmy [name], Jimmy [name]. 05/04/2022 10:00 A M Medical Record Number: [id number] Patient Account Number: [id number] Date of Birth/Sex: Treating RN: 07/01/1966 (56 y.o. [name] Primary Care Marvelyn Bouchillon: [name] Other Clinician: [name] Referring Koby Pickup: Treating Jimmy [name]/Extender: Jimmy [name] in Treatment: 1 Encounter Discharge Information Items Discharge Condition: Stable Ambulatory Status:  Ambulatory Discharge Destination: Home Transportation: Private Auto Accompanied By: None Schedule Follow-up Appointment: Yes Clinical Summary of Care: Electronic Signature(s) Signed: 05/04/2022 4:46:30 PM By: [name] EMT Entered By: [name] on 05/04/2022 16:46:30 -------------------------------------------------------------------------------- Vitals Details Patient Name: Date of Service: [name] 05/04/2022 10:00 A M Medical Record Number: [id number] Patient Account Number: [id number] Date of Birth/Sex: Treating RN: 11-Mar-1966 (56 y.o. [name], [name] Primary Care Posey Petrik: [name] Other Clinician: [name] Referring Amire Leazer: Treating Dorothee Napierkowski/Extender: Jimmy [name] in Treatment: 1 Vital Signs Time Taken: 09:33 Temperature (F): 98.2 Height (in): 71 Pulse (bpm): 64 Weight (lbs): 190 Respiratory Rate (breaths/min): 16 Body Mass Index (BMI): 26.5 Blood Pressure (mmHg): 115/71 Reference Range: 80 - 120 mg / dl Electronic Signature(s) Signed: 05/04/2022 4:21:04 PM By: [name] EMT Entered By: [name] on 05/04/2022 16:21:03

## 2022-05-04 NOTE — Progress Notes (Signed)
Jimmy [name], Jimmy [name] ([id number]) Visit Report for 05/04/2022 HBO Details Patient Name: Date of Service: [name] 05/04/2022 10:00 A M Medical Record Number: [id number] Patient Account Number: [id number] Date of Birth/Sex: Treating RN: [birth year] (56 y.o. 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 HBO Treatment Details Treatment Number: 1 Patient Type: Outpatient Chamber Type: Monoplace Chamber Serial #: [id number] Treatment Protocol: 2.0 ATA with 90 minutes oxygen, with two 5 minute air breaks Treatment Details Compression Rate Down: 1.0 psi / minute De-Compression Rate Up: 1.0 psi / minute A breaks and breathing ir Compress Tx Pressure periods Decompress Decompress Begins Reached (leave unused spaces Begins Ends blank) Chamber Pressure (ATA 1 2 2 2 2 2  --2 1 ) Clock Time (24 hr) 10:40 10:58 11:29 11:34 12:04 12:09 - - 12:39 12:59 Treatment Length: 139 (minutes) Treatment Segments: 5 Vital Signs Capillary Blood Glucose Reference Range: 80 - 120 mg / dl HBO Diabetic Blood Glucose Intervention Range: <131 mg/dl or mg/dl Time Vitals Blood Respiratory Capillary Blood Glucose Pulse Action Type: Pulse: Temperature: Taken: Pressure: Rate: Glucose (mg/dl): Meter #: Oximetry (%) Taken: Pre 09:33 115/71 64 16 98.2 Post 13:00 111/72 70 16 97.9 Treatment Response Treatment Toleration: Well Treatment Completion Status: Treatment Completed without Adverse Event Treatment Notes Today was the patient first treatment. The patient was seen by Dr. >735, before treatment started. The patient traveled @ 1.0  PSI/min to 2.0 ATA. The patient was asked at 4 PSI,6 PSI,8 PSI,10 PSI, and 2.0 ATA, if he had cleared his ears. He stated yes each time. No problems noted. Electronic Signature(s) Signed: 05/04/2022 4:28:02 PM By: 05/06/2022 EMT Signed: 05/05/2022 6:23:18 PM By: 05/07/2022 DO Entered By: Geralyn Corwin on 05/04/2022 16:28:02 -------------------------------------------------------------------------------- HBO Safety Checklist Details Patient Name: Date of Service: 05/06/2022 [name] 05/04/2022 10:00 A M Medical Record Number: 05/06/2022 Patient Account Number: [id number] Date of Birth/Sex: Treating RN: 1966/07/25 (56 y.o. 54, Jimmy [name] Primary Care Liyana Suniga: [name] Other Clinician: [name] Referring Thecla Forgione: Treating Jaquan Sadowsky/Extender: [name] in Treatment: 1 HBO Safety Checklist Items Safety Checklist Consent Form Signed Patient voided / foley secured and emptied When did you last eato 0630 Last dose of injectable or oral agent NA Ostomy pouch emptied and vented if applicable NA All implantable devices assessed, documented and approved NA Intravenous access site secured and place NA Valuables secured Linens and cotton and cotton/polyester blend (less than 51% polyester) Personal oil-based products / skin lotions / body lotions removed Wigs or hairpieces removed NA Smoking or tobacco materials removed Books / newspapers / magazines / loose paper removed Cologne, aftershave, perfume and deodorant removed Jewelry removed (may wrap wedding band) NA Make-up removed NA Hair care products removed Battery operated devices (external) removed Heating patches and chemical warmers removed Titanium eyewear removed NA Nail polish cured greater than 10 hours NA Casting material cured greater than 10 hours NA Hearing aids removed NA Loose dentures or partials removed NA Prosthetics have been removed NA Patient  demonstrates correct use of air break device (if applicable) Patient concerns have been addressed Patient grounding bracelet on and cord attached to chamber Specifics for Inpatients (complete in addition to above) Medication sheet sent with patient  NA Intravenous medications needed or due during therapy sent with patient NA Drainage tubes (e.g. nasogastric tube or chest tube secured and vented) NA Endotracheal or Tracheotomy tube secured NA Cuff deflated of air and inflated with saline NA Airway suctioned NA Notes Safety checklist was done before treatment started. Electronic Signature(s) Signed: 05/04/2022 4:45:02 PM By: [name] EMT Previous Signature: 05/04/2022 4:22:07 PM Version By: [name] EMT Entered By: [name] on 05/04/2022 16:45:02

## 2022-05-05 ENCOUNTER — Encounter (HOSPITAL_BASED_OUTPATIENT_CLINIC_OR_DEPARTMENT_OTHER): Payer: BC Managed Care – PPO | Admitting: Internal Medicine

## 2022-05-05 DIAGNOSIS — K260 Acute duodenal ulcer with hemorrhage: Secondary | ICD-10-CM | POA: Diagnosis not present

## 2022-05-05 DIAGNOSIS — Z87828 Personal history of other (healed) physical injury and trauma: Secondary | ICD-10-CM | POA: Diagnosis not present

## 2022-05-05 DIAGNOSIS — M8668 Other chronic osteomyelitis, other site: Secondary | ICD-10-CM

## 2022-05-05 DIAGNOSIS — Z21 Asymptomatic human immunodeficiency virus [HIV] infection status: Secondary | ICD-10-CM

## 2022-05-05 DIAGNOSIS — M269 Dentofacial anomaly, unspecified: Secondary | ICD-10-CM

## 2022-05-05 DIAGNOSIS — M8660 Other chronic osteomyelitis, unspecified site: Secondary | ICD-10-CM | POA: Diagnosis not present

## 2022-05-05 NOTE — Progress Notes (Addendum)
BURL, TAUZIN ([id number]) Visit Report for 05/05/2022 HBO Details Patient Name: Date of Service: [name] 05/05/2022 10:00 A M Medical Record Number: [id number] Patient Account Number: [id number] Date of Birth/Sex: Treating RN: 16-Jun-1966 (56 y.o. 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 HBO Treatment Details Treatment Number: 2 Patient Type: Outpatient Chamber Type: Monoplace Chamber Serial #: [id number] Treatment Protocol: 2.0 ATA with 90 minutes oxygen, with two 5 minute air breaks Treatment Details Compression Rate Down: 1.0 psi / minute De-Compression Rate Up: 1.0 psi / minute A breaks and breathing ir Compress Tx Pressure periods Decompress Decompress Begins Reached (leave unused spaces Begins Ends blank) Chamber Pressure (ATA 1 2 2 2 2 2  --2 1 ) Clock Time (24 hr) 09:37 09:54 10:24 10:29 10:59 11:04 - - 11:34 11:48 Treatment Length: 131 (minutes) Treatment Segments: 4 Vital Signs Capillary Blood Glucose Reference Range: 80 - 120 mg / dl HBO Diabetic Blood Glucose Intervention Range: <131 mg/dl or mg/dl Time Vitals Blood Respiratory Capillary Blood Glucose Pulse Action Type: Pulse: Temperature: Taken: Pressure: Rate: Glucose (mg/dl): Meter #: Oximetry (%) Taken: Pre 09:33 128/68 79 16 97.6 Post 11:52 115/74 74 18 98.1 Treatment Response Treatment Toleration: Well Treatment Completion Status: Treatment Completed without Adverse Event Physician HBO Attestation: I certify that I supervised this HBO treatment in accordance with Medicare guidelines. A trained emergency response  team is readily available per Yes hospital policies and procedures. Continue HBOT as ordered. Yes Electronic Signature(s) Signed: 05/07/2022 5:19:24 PM By: 05/09/2022 DO Previous Signature: 05/05/2022 4:34:28 PM Version By: 05/07/2022 EMT Entered By: [name] on 05/07/2022 17:18:06 -------------------------------------------------------------------------------- HBO Safety Checklist Details Patient Name: Date of Service: 05/09/2022 [name] 05/05/2022 10:00 A M Medical Record Number: 05/07/2022 Patient Account Number: [id number] Date of Birth/Sex: Treating RN: 1966-01-27 (57 y.o. 54 Primary Care Adrieanna Boteler: [name] Other Clinician: [name] Referring Barbette Mcglaun: Treating Tony Friscia/Extender: [name] in Treatment: 1 HBO Safety Checklist Items Safety Checklist Consent Form Signed Patient voided / foley secured and emptied When did you last eato 0630 Last dose of injectable or oral agent NA Ostomy pouch emptied and vented if applicable NA All implantable devices assessed, documented and approved NA Intravenous access site secured and place NA Valuables secured Linens and cotton and cotton/polyester blend (less than 51% polyester) Personal oil-based products / skin lotions / body lotions removed Wigs or hairpieces removed NA Smoking or tobacco materials removed Books / newspapers / magazines / loose paper removed Cologne, aftershave, perfume and deodorant removed Jewelry removed (may wrap wedding band) NA Make-up removed NA Hair care products removed Battery operated devices (external) removed Heating patches and chemical warmers removed Titanium eyewear removed NA Nail polish cured greater than 10 hours NA Casting material cured greater than 10 hours NA Hearing aids removed NA Loose dentures or partials removed NA Prosthetics have been removed NA Patient demonstrates correct use of air break  device (if applicable) Patient concerns have been addressed Patient grounding bracelet on and cord attached to chamber Specifics for Inpatients (complete in addition to above) Medication sheet sent with patient NA Intravenous medications needed or due during therapy sent with patient NA Drainage tubes (e.g. nasogastric tube  or chest tube secured and vented) NA Endotracheal or Tracheotomy tube secured NA Cuff deflated of air and inflated with saline NA Airway suctioned NA Electronic Signature(s) Signed: 05/05/2022 4:33:15 PM By: [name] EMT Entered By: [name] on 05/05/2022 16:33:15

## 2022-05-05 NOTE — Progress Notes (Signed)
JAYD, CADIEUX ([id number]) Visit Report for 05/04/2022 Problem List Details Patient Name: Date of Service: [name] 05/04/2022 10:00 A M Medical Record Number: [id number] Patient Account Number: [id number] Date of Birth/Sex: Treating RN: 14-Nov-1966 (56 y.o. 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, unspecified 04/24/2022 No Yes Z21 Asymptomatic human immunodeficiency virus [HIV] infection status 04/24/2022 No Yes K26.0 Acute duodenal ulcer with hemorrhage 04/24/2022 No Yes Inactive Problems Resolved Problems Electronic Signature(s) Signed: 05/04/2022 4:45:58 PM By: [name] EMT Signed: 05/05/2022 6:23:18 PM By: Geralyn Corwin DO Previous Signature: 05/04/2022 4:44:38 PM Version By: [name] EMT Entered By: [name] on 05/04/2022 16:45:58 -------------------------------------------------------------------------------- SuperBill Details Patient Name: Date of Service: [name] 05/04/2022 Medical Record Number: [id number] Patient Account Number: [id number] Date of Birth/Sex: Treating RN: Nov 17, 1965 (56 y.o. 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, unspecified Z21 Asymptomatic human immunodeficiency virus [HIV]  infection status K26.0 Acute duodenal ulcer with hemorrhage Facility Procedures CPT4 Code: 02548628 Description: G0277-(Facility Use Only) HBOT full body chamber, , ICD-10 Diagnosis Description M86.68 Other chronic osteomyelitis, other site Z87.828 Personal history of other (healed) physical injury and trauma 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,  unspecified Z21 Asymptomatic human immunodeficiency virus [HIV] infection status Modifier: Quantity: 5 Physician Procedures : CPT4 Code Description Modifier [id number] - WC PHYS HYPERBARIC OXYGEN THERAPY ICD-10 Diagnosis Description M86.68 Other chronic osteomyelitis, other site Z87.828 Personal history of other (healed) physical injury and trauma M26.9 Dentofacial  anomaly, unspecified Z21 Asymptomatic human immunodeficiency virus [HIV] infection status Quantity: 1 Electronic Signature(s) Signed: 05/04/2022 4:45:50 PM By: [name] EMT Signed: 05/05/2022 6:23:18 PM By: Geralyn Corwin DO Entered By: [name] on 05/04/2022 16:45:49

## 2022-05-05 NOTE — Progress Notes (Signed)
BERLIN, MOKRY ([id number]) Visit Report for 05/05/2022 Arrival Information Details Patient Name: Date of Service: Jimmy [name]. 05/05/2022 10:00 A M Medical Record Number: [id number] Patient Account Number: [id number] Date of Birth/Sex: Treating RN: 1966/06/01 (56 y.o. Jimmy [name] Primary Care Van Seymore: [name] Other Clinician: Yevonne Pax Referring Ariyanah Aguado: Treating Margareth Kanner/Extender: [name] in Treatment: 1 Visit Information History Since Last Visit All ordered tests and consults were completed: Yes Patient Arrived: Ambulatory Added or deleted any medications: No Arrival Time: 09:19 Any new allergies or adverse reactions: No Accompanied By: None Had a fall or experienced change in No Transfer Assistance: None activities of daily living that may affect Patient Identification Verified: Yes risk of falls: Secondary Verification Process Completed: Yes Signs or symptoms of abuse/neglect since last visito No Patient Requires Transmission-Based Precautions: No Hospitalized since last visit: No Patient Has Alerts: No Implantable device outside of the clinic excluding No cellular tissue based products placed in the center since last visit: Pain Present Now: No Electronic Signature(s) Signed: 05/05/2022 4:31:50 PM By: [name] EMT Entered By: [name] on 05/05/2022 16:31:49 -------------------------------------------------------------------------------- Encounter Discharge Information Details Patient Name: Date of Service: Jimmy [name] 05/05/2022 10:00 A M Medical Record Number: [id number] Patient Account Number: [id number] Date of Birth/Sex: Treating RN: 24-Feb-1966 (56 y.o. Jimmy [name] Primary Care Krystyna Cleckley: [name] Other Clinician: [name] Referring Calvina Liptak: Treating Chayse Zatarain/Extender: [name] in Treatment: 1 Encounter Discharge Information  Items Discharge Condition: Stable Ambulatory Status: Ambulatory Discharge Destination: Home Transportation: Private Auto Accompanied By: None Schedule Follow-up Appointment: Yes Clinical Summary of Care: Electronic Signature(s) Signed: 05/05/2022 4:36:13 PM By: [name] EMT Entered By: [name] on 05/05/2022 16:36:13 -------------------------------------------------------------------------------- Vitals Details Patient Name: Date of Service: Jimmy [name] 05/05/2022 10:00 A M Medical Record Number: [id number] Patient Account Number: [id number] Date of Birth/Sex: Treating RN: 11-Jun-1966 (56 y.o. Jimmy [name] Primary Care Vineeth Fell: [name] Other Clinician: [name] Referring Kemiyah Tarazon: Treating Joandry Slagter/Extender: [name] in Treatment: 1 Vital Signs Time Taken: 09:33 Temperature (F): 97.6 Height (in): 71 Pulse (bpm): 79 Weight (lbs): 190 Respiratory Rate (breaths/min): 16 Body Mass Index (BMI): 26.5 Blood Pressure (mmHg): 128/68 Reference Range: 80 - 120 mg / dl Electronic Signature(s) Signed: 05/05/2022 4:32:21 PM By: [name] EMT Entered By: [name] on 05/05/2022 16:32:21

## 2022-05-06 ENCOUNTER — Encounter (HOSPITAL_BASED_OUTPATIENT_CLINIC_OR_DEPARTMENT_OTHER): Payer: BC Managed Care – PPO | Admitting: General Surgery

## 2022-05-06 ENCOUNTER — Telehealth: Payer: Self-pay | Admitting: Pharmacy Technician

## 2022-05-06 DIAGNOSIS — M8660 Other chronic osteomyelitis, unspecified site: Secondary | ICD-10-CM | POA: Diagnosis not present

## 2022-05-06 DIAGNOSIS — M269 Dentofacial anomaly, unspecified: Secondary | ICD-10-CM | POA: Diagnosis not present

## 2022-05-06 DIAGNOSIS — K260 Acute duodenal ulcer with hemorrhage: Secondary | ICD-10-CM | POA: Diagnosis not present

## 2022-05-06 NOTE — Progress Notes (Signed)
TERMAINE, ROUPP ([id number]) Visit Report for 05/06/2022 Arrival Information Details Patient Name: Date of Service: [name] 05/06/2022 10:00 A M Medical Record Number: [id number] Patient Account Number: [id number] Date of Birth/Sex: Treating RN: 11-27-1965 (56 y.o. [name] Primary Care Dylen Mcelhannon: [name] Other Clinician: [name] Referring Gwynne Kemnitz: Treating Khyrin Trevathan/Extender: [name] in Treatment: 1 Visit Information History Since Last Visit All ordered tests and consults were completed: Yes Patient Arrived: Ambulatory Added or deleted any medications: No Arrival Time: 09:21 Any new allergies or adverse reactions: No Accompanied By: None Had a fall or experienced change in No Transfer Assistance: None activities of daily living that may affect Patient Identification Verified: Yes risk of falls: Secondary Verification Process Completed: Yes Signs or symptoms of abuse/neglect since last visito No Patient Requires Transmission-Based Precautions: No Hospitalized since last visit: No Patient Has Alerts: No Implantable device outside of the clinic excluding No cellular tissue based products placed in the center since last visit: Pain Present Now: No Electronic Signature(s) Signed: 05/06/2022 1:58:59 PM By: [name] EMT Entered By: [name] on 05/06/2022 13:58:58 -------------------------------------------------------------------------------- Encounter Discharge Information Details Patient Name: Date of Service: [name] 05/06/2022 10:00 A M Medical Record Number: [id number] Patient Account Number: [id number] Date of Birth/Sex: Treating RN: 02-24-1966 (56 y.o. [name] Primary Care Brucha Ahlquist: [name] Other Clinician: [name] Referring Carlye Panameno: Treating Roxane Puerto/Extender: [name] in Treatment: 1 Encounter Discharge Information  Items Discharge Condition: Stable Ambulatory Status: Ambulatory Discharge Destination: Home Transportation: Private Auto Accompanied By: None Schedule Follow-up Appointment: Yes Clinical Summary of Care: Electronic Signature(s) Signed: 05/06/2022 2:13:39 PM By: [name] EMT Entered By: [name] on 05/06/2022 14:13:39 -------------------------------------------------------------------------------- Vitals Details Patient Name: Date of Service: [name] 05/06/2022 10:00 A M Medical Record Number: [id number] Patient Account Number: [id number] Date of Birth/Sex: Treating RN: 09-Apr-1966 (56 y.o. [name] Primary Care Kalilah Barua: [name] Other Clinician: [name] Referring Luci Bellucci: Treating Tamario Heal/Extender: [name] in Treatment: 1 Vital Signs Time Taken: 10:17 Temperature (F): 97.9 Height (in): 71 Pulse (bpm): 79 Weight (lbs): 190 Respiratory Rate (breaths/min): 16 Body Mass Index (BMI): 26.5 Blood Pressure (mmHg): 117/71 Reference Range: 80 - 120 mg / dl Electronic Signature(s) Signed: 05/06/2022 1:59:27 PM By: [name] EMT Entered By: [name] on 05/06/2022 13:59:27

## 2022-05-06 NOTE — Progress Notes (Signed)
Jimmy [name], Jimmy [name] ([id number]) Visit Report for 05/06/2022 Problem List Details Patient Name: Date of Service: [name] 05/06/2022 10:00 A M Medical Record Number: [id number] Patient Account Number: [id number] Date of Birth/Sex: Treating RN: August 04, 1966 (56 y.o. 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 05/06/2022 Medical Record Number: [id number] Patient Account Number: [id number] Date of Birth/Sex: Treating RN: June 29, 1966 (56 y.o. 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, unspecified Z21 Asymptomatic human immunodeficiency virus [HIV] infection status K26.0 Acute duodenal ulcer with hemorrhage Facility  Procedures CPT4 Code: [id number] Description: G0277-(Facility Use Only) HBOT full body chamber, , ICD-10 Diagnosis Description M86.68 Other chronic osteomyelitis, other site Z87.828 Personal history of other (healed) physical injury and trauma 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,  unspecified Z21 Asymptomatic human immunodeficiency virus [HIV] infection status Modifier: Quantity: 4 Physician Procedures : CPT4 Code Description Modifier [id number] - WC PHYS HYPERBARIC OXYGEN THERAPY ICD-10 Diagnosis Description M86.68 Other chronic osteomyelitis, other site Z87.828 Personal history of other (healed) physical injury and trauma M26.9 Dentofacial  anomaly, unspecified Z21 Asymptomatic human immunodeficiency virus [HIV] infection status Quantity: 1 Electronic Signature(s) Signed: 05/06/2022 2:12:46 PM By: [name] EMT Signed: 05/06/2022 3:36:19 PM By: [name] Entered By: [name] on 05/06/2022 14:12:45

## 2022-05-06 NOTE — Progress Notes (Addendum)
Jimmy [name], Jimmy [name] ([id number]) Visit Report for 05/06/2022 HBO Details Patient Name: Date of Service: [name] 05/06/2022 10:00 A M Medical Record Number: [id number] Patient Account Number: [id number] Date of Birth/Sex: Treating RN: [birth year] (56 y.o. 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 HBO Treatment Details Treatment Number: 3 Patient Type: Outpatient Chamber Type: Monoplace Chamber Serial #: [id number] Treatment Protocol: 2.0 ATA with 90 minutes oxygen, with two 5 minute air breaks Treatment Details Compression Rate Down: 1.5 psi / minute De-Compression Rate Up: 1.5 psi / minute A breaks and breathing ir Compress Tx Pressure periods Decompress Decompress Begins Reached (leave unused spaces Begins Ends blank) Chamber Pressure (ATA 1 2 2 2 2 2  --2 1 ) Clock Time (24 hr) 10:20 10:35 11:05 11:10 11:40 11:45 - - 12:15 12:27 Treatment Length: 127 (minutes) Treatment Segments: 4 Vital Signs Capillary Blood Glucose Reference Range: 80 - 120 mg / dl HBO Diabetic Blood Glucose Intervention Range: <131 mg/dl or mg/dl Time Vitals Blood Respiratory Capillary Blood Glucose Pulse Action Type: Pulse: Temperature: Taken: Pressure: Rate: Glucose (mg/dl): Meter #: Oximetry (%) Taken: Pre 10:17 117/71 79 16 97.9 Post 12:30 125/70 74 18 97.9 Treatment Response Treatment Toleration: Well Treatment Completion Status: Treatment Completed without Adverse Event Physician HBO Attestation: I certify that I supervised this HBO treatment in accordance with Medicare guidelines. A trained emergency response team  is readily available per Yes hospital policies and procedures. Continue HBOT as ordered. Yes Electronic Signature(s) Signed: 05/06/2022 3:37:33 PM By: 05/08/2022 MD FACS Previous Signature: 05/06/2022 2:12:05 PM Version By: 05/08/2022 EMT Entered By: [name] on 05/06/2022 15:37:33 -------------------------------------------------------------------------------- HBO Safety Checklist Details Patient Name: Date of Service: 05/08/2022 [name] 05/06/2022 10:00 A M Medical Record Number: 05/08/2022 Patient Account Number: [id number] Date of Birth/Sex: Treating RN: 19-Apr-1966 (56 y.o. 54 Primary Care Jennet Scroggin: [name] Other Clinician: [name] Referring Monasia Lair: Treating Raigan Baria/Extender: [name] in Treatment: 1 HBO Safety Checklist Items Safety Checklist Consent Form Signed Patient voided / foley secured and emptied When did you last eato 0600 Last dose of injectable or oral agent NA Ostomy pouch emptied and vented if applicable NA All implantable devices assessed, documented and approved NA Intravenous access site secured and place NA Valuables secured Linens and cotton and cotton/polyester blend (less than 51% polyester) Personal oil-based products / skin lotions / body lotions removed Wigs or hairpieces removed NA Smoking or tobacco materials removed Books / newspapers / magazines / loose paper removed Cologne, aftershave, perfume and deodorant removed Jewelry removed (may wrap wedding band) NA Make-up removed NA Hair care products removed Battery operated devices (external) removed Heating patches and chemical warmers removed Titanium eyewear removed NA Nail polish cured greater than 10 hours NA Casting material cured greater than 10 hours NA Hearing aids removed NA Loose dentures or partials removed NA Prosthetics have been removed Patient demonstrates correct use of air break device  (if applicable) Patient concerns have been addressed Patient grounding bracelet on and cord attached to chamber Specifics for Inpatients (complete in addition to above) Medication sheet sent with patient NA Intravenous medications needed or due during therapy sent with patient NA Drainage tubes (e.g. nasogastric tube  or chest tube secured and vented) NA Endotracheal or Tracheotomy tube secured NA Cuff deflated of air and inflated with saline NA Airway suctioned NA Notes The safety checklist was done before the treatment was started. Electronic Signature(s) Signed: 05/06/2022 2:00:59 PM By: [name] EMT Entered By: [name] on 05/06/2022 14:00:59

## 2022-05-06 NOTE — Telephone Encounter (Signed)
Auth Submission: no auth needed Payer: [employer] Medication & CPT/J Code(s) submitted: Feraheme (ferumoxytol) [id number] Route of submission (phone, fax, portal): PORTAL Auth type: Buy/Bill Units/visits requested: X2 DOSES Reference number:  Approval from: 05/06/22 to 08/06/22   Auth was cancelled due to no prior auth is needed

## 2022-05-07 ENCOUNTER — Encounter (HOSPITAL_BASED_OUTPATIENT_CLINIC_OR_DEPARTMENT_OTHER): Payer: BC Managed Care – PPO | Admitting: General Surgery

## 2022-05-07 DIAGNOSIS — M269 Dentofacial anomaly, unspecified: Secondary | ICD-10-CM | POA: Diagnosis not present

## 2022-05-07 DIAGNOSIS — M8660 Other chronic osteomyelitis, unspecified site: Secondary | ICD-10-CM | POA: Diagnosis not present

## 2022-05-07 DIAGNOSIS — K260 Acute duodenal ulcer with hemorrhage: Secondary | ICD-10-CM | POA: Diagnosis not present

## 2022-05-07 NOTE — Progress Notes (Signed)
MAXIMO, SPRATLING ([id number]) Visit Report for 05/07/2022 SuperBill Details Patient Name: Date of Service: Jimmy [name] 05/07/2022 Medical Record Number: [id number] Patient Account Number: [id number] Date of Birth/Sex: Treating RN: June 02, 1966 (56 y.o. 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, unspecified Z21 Asymptomatic human immunodeficiency virus [HIV] infection status K26.0 Acute duodenal ulcer with hemorrhage Facility Procedures CPT4 Code Description Modifier Quantity [id number] G0277-(Facility Use Only) HBOT full body chamber, , 4 ICD-10 Diagnosis Description M86.68 Other chronic osteomyelitis, other site 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

## 2022-05-07 NOTE — Progress Notes (Signed)
Jimmy [name], Jimmy [name] ([id number]) Visit Report for 05/07/2022 HBO Details Patient Name: Date of Service: Jimmy [name]. 05/07/2022 10:00 A M Medical Record Number: [id number] Patient Account Number: [id number] Date of Birth/Sex: Treating RN: [birth year] (56 y.o. 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 HBO Treatment Details Treatment Number: 4 Patient Type: Outpatient Chamber Type: Monoplace Chamber Serial #: [id number] Treatment Protocol: 2.0 ATA with 90 minutes oxygen, with two 5 minute air breaks Treatment Details Compression Rate Down: 1.5 psi / minute De-Compression Rate Up: 2.0 psi / minute A breaks and breathing ir Compress Tx Pressure periods Decompress Decompress Begins Reached (leave unused spaces Begins Ends blank) Chamber Pressure (ATA 1 2 2 2 2 2  --2 1 ) Clock Time (24 hr) 09:37 09:50 10:10 10:15 10:45 10:50 - - 11:20 11:38 Treatment Length: 121 (minutes) Treatment Segments: 4 Vital Signs Capillary Blood Glucose Reference Range: 80 - 120 mg / dl HBO Diabetic Blood Glucose Intervention Range: <131 mg/dl or mg/dl Time Vitals Blood Respiratory Capillary Blood Glucose Pulse Action Type: Pulse: Temperature: Taken: Pressure: Rate: Glucose (mg/dl): Meter #: Oximetry (%) Taken: Pre 09:35 117/72 92 18 97.7 Post 11:41 105/68 73 18 97.5 Treatment Response Treatment Toleration: Well Treatment Completion Status: Treatment Completed without Adverse Event Physician HBO Attestation: I certify that I supervised this HBO treatment in accordance with Medicare guidelines. A trained emergency response  team is readily available per Yes hospital policies and procedures. Continue HBOT as ordered. Yes Electronic Signature(s) Signed: 05/07/2022 4:07:33 PM By: 05/09/2022 MD FACS Entered By: [name] on 05/07/2022 16:07:33 -------------------------------------------------------------------------------- HBO Safety Checklist Details Patient Name: Date of Service: 05/09/2022 Jimmy W. 05/07/2022 10:00 A M Medical Record Number: 05/09/2022 Patient Account Number: [id number] Date of Birth/Sex: Treating RN: Nov 06, 1966 (56 y.o. 54, Jimmy [name] Primary Care [name]: [name] Other Clinician: [name] Referring Quaneshia Wareing: Treating Rashae Rother/Extender: [name] in Treatment: 1 HBO Safety Checklist Items Safety Checklist Consent Form Signed Patient voided / foley secured and emptied When did you last eato 0630 Last dose of injectable or oral agent n/a Ostomy pouch emptied and vented if applicable NA All implantable devices assessed, documented and approved NA Intravenous access site secured and place NA Valuables secured Linens and cotton and cotton/polyester blend (less than 51% polyester) Personal oil-based products / skin lotions / body lotions removed Wigs or hairpieces removed NA Smoking or tobacco materials removed NA Books / newspapers / magazines / loose paper removed Cologne, aftershave, perfume and deodorant removed Jewelry removed (may wrap wedding band) Make-up removed NA Hair care products removed Battery operated devices (external) removed Heating patches and chemical warmers removed Titanium eyewear removed NA Nail polish cured greater than 10 hours NA Casting material cured greater than 10 hours NA Hearing aids removed NA Loose dentures or partials removed NA Prosthetics have been removed NA Patient demonstrates correct use of air break device (if applicable) Patient concerns have been addressed Patient  grounding bracelet on and cord attached to chamber Specifics for Inpatients (complete in addition to above) Medication sheet sent with patient NA Intravenous medications needed or due during therapy sent with patient NA Drainage tubes (e.g. nasogastric tube or chest tube secured and vented) NA Endotracheal or  Tracheotomy tube secured NA Cuff deflated of air and inflated with saline NA Airway suctioned NA Notes Paper version used prior to treatment. Electronic Signature(s) Signed: 05/07/2022 4:18:42 PM By: [name] CHT EMT BS , , Entered By: [name] on 05/07/2022 10:54:37

## 2022-05-07 NOTE — Progress Notes (Signed)
KYCE, GING ([id number]) Visit Report for 05/07/2022 Arrival Information Details Patient Name: Date of Service: Jimmy [name]. 05/07/2022 10:00 A M Medical Record Number: [id number] Patient Account Number: [id number] Date of Birth/Sex: Treating RN: Jimmy [name] (56 y.o. Jimmy [name], Jimmy [name] Primary Care Jimmy [name]: Jimmy [name] Other Clinician: Haywood [name] Referring Jimmy [name]: Treating Jimmy [name]/Extender: Jimmy [name] in Treatment: 1 Visit Information History Since Last Visit All ordered tests and consults were completed: Yes Patient Arrived: Ambulatory Added or deleted any medications: No Arrival Time: 09:25 Any new allergies or adverse reactions: No Accompanied By: self Had a fall or experienced change in No Transfer Assistance: None activities of daily living that may affect Patient Identification Verified: Yes risk of falls: Secondary Verification Process Completed: Yes Signs or symptoms of abuse/neglect since last visito No Patient Requires Transmission-Based Precautions: No Hospitalized since last visit: No Patient Has Alerts: No Implantable device outside of the clinic excluding No cellular tissue based products placed in the center since last visit: Pain Present Now: No Electronic Signature(s) Signed: 05/07/2022 4:18:42 PM By: Jimmy [name] CHT EMT BS , , Entered By: Jimmy [name] on 05/07/2022 10:47:50 -------------------------------------------------------------------------------- Encounter Discharge Information Details Patient Name: Date of Service: Jimmy [name], Jimmy Birks [name] 05/07/2022 10:00 A M Medical Record Number: [id number] Patient Account Number: [id number] Date of Birth/Sex: Treating RN: Jimmy (56 y.o. Jimmy [name] Primary Care Haydan Wedig: Jimmy [name] Other Clinician: Haywood [name] Referring Dearl Rudden: Treating Shalina Norfolk/Extender: Jimmy [name] in Treatment: 1 Encounter Discharge  Information Items Discharge Condition: Stable Ambulatory Status: Ambulatory Discharge Destination: Home Transportation: Private Auto Accompanied By: self Schedule Follow-up Appointment: No Clinical Summary of Care: Electronic Signature(s) Signed: 05/07/2022 4:18:42 PM By: Jimmy [name] CHT EMT BS , , Entered By: Jimmy [name] on 05/07/2022 12:08:52 -------------------------------------------------------------------------------- Vitals Details Patient Name: Date of Service: Jimmy Nian [name] 05/07/2022 10:00 A M Medical Record Number: [id number] Patient Account Number: [id number] Date of Birth/Sex: Treating RN: [name] (56 y.o. Jimmy [name], Jimmy [name] Primary Care Jimmy [name]: Jimmy [name] Other Clinician: Haywood [name] Referring Jimmy [name]: Treating Jimmy [name]/Extender: Jimmy [name] in Treatment: 1 Vital Signs Time Taken: 09:35 Temperature (F): 97.7 Height (in): 71 Pulse (bpm): 92 Weight (lbs): 190 Respiratory Rate (breaths/min): 18 Body Mass Index (BMI): 26.5 Blood Pressure (mmHg): 117/72 Reference Range: 80 - 120 mg / dl Electronic Signature(s) Signed: 05/07/2022 4:18:42 PM By: Jimmy [name] CHT EMT BS , , Entered By: Jimmy [name] on 05/07/2022 10:48:16

## 2022-05-08 ENCOUNTER — Encounter (HOSPITAL_BASED_OUTPATIENT_CLINIC_OR_DEPARTMENT_OTHER): Payer: BC Managed Care – PPO | Admitting: Internal Medicine

## 2022-05-08 DIAGNOSIS — M8660 Other chronic osteomyelitis, unspecified site: Secondary | ICD-10-CM | POA: Diagnosis not present

## 2022-05-08 DIAGNOSIS — Z21 Asymptomatic human immunodeficiency virus [HIV] infection status: Secondary | ICD-10-CM

## 2022-05-08 DIAGNOSIS — M269 Dentofacial anomaly, unspecified: Secondary | ICD-10-CM | POA: Diagnosis not present

## 2022-05-08 DIAGNOSIS — Z87828 Personal history of other (healed) physical injury and trauma: Secondary | ICD-10-CM

## 2022-05-08 DIAGNOSIS — M8668 Other chronic osteomyelitis, other site: Secondary | ICD-10-CM

## 2022-05-08 DIAGNOSIS — K260 Acute duodenal ulcer with hemorrhage: Secondary | ICD-10-CM | POA: Diagnosis not present

## 2022-05-11 ENCOUNTER — Ambulatory Visit (INDEPENDENT_AMBULATORY_CARE_PROVIDER_SITE_OTHER): Payer: BC Managed Care – PPO

## 2022-05-11 ENCOUNTER — Encounter (HOSPITAL_BASED_OUTPATIENT_CLINIC_OR_DEPARTMENT_OTHER): Payer: BC Managed Care – PPO | Admitting: Internal Medicine

## 2022-05-11 VITALS — BP 113/66 | HR 74 | Temp 98.1°F | Resp 18 | Ht 71.0 in | Wt 196.4 lb

## 2022-05-11 DIAGNOSIS — Z87828 Personal history of other (healed) physical injury and trauma: Secondary | ICD-10-CM | POA: Diagnosis not present

## 2022-05-11 DIAGNOSIS — Z21 Asymptomatic human immunodeficiency virus [HIV] infection status: Secondary | ICD-10-CM | POA: Diagnosis not present

## 2022-05-11 DIAGNOSIS — D509 Iron deficiency anemia, unspecified: Secondary | ICD-10-CM

## 2022-05-11 DIAGNOSIS — M8660 Other chronic osteomyelitis, unspecified site: Secondary | ICD-10-CM | POA: Diagnosis not present

## 2022-05-11 DIAGNOSIS — K260 Acute duodenal ulcer with hemorrhage: Secondary | ICD-10-CM | POA: Diagnosis not present

## 2022-05-11 DIAGNOSIS — D62 Acute posthemorrhagic anemia: Secondary | ICD-10-CM

## 2022-05-11 DIAGNOSIS — M8668 Other chronic osteomyelitis, other site: Secondary | ICD-10-CM | POA: Diagnosis not present

## 2022-05-11 DIAGNOSIS — M269 Dentofacial anomaly, unspecified: Secondary | ICD-10-CM | POA: Diagnosis not present

## 2022-05-11 MED ORDER — SODIUM CHLORIDE 0.9 % IV SOLN
510.0000 mg | Freq: Once | INTRAVENOUS | Status: AC
  Administered 2022-05-11: 510 mg via INTRAVENOUS
  Filled 2022-05-11: qty 17

## 2022-05-11 NOTE — Progress Notes (Signed)
Diagnosis: Iron Deficiency Anemia  Provider:  Chilton Greathouse, MD  Procedure: Infusion  IV Type: Peripheral, IV Location: L Hand  Venofer (Iron Sucrose), Dose: 510 mg  Infusion Start Time: 1359  Infusion Stop Time: 1419  Post Infusion IV Care: Peripheral IV Discontinued  Discharge: Condition: Good, Destination: Home . AVS provided to patient.   Performed by:  Loney Hering, LPN

## 2022-05-12 ENCOUNTER — Encounter (HOSPITAL_BASED_OUTPATIENT_CLINIC_OR_DEPARTMENT_OTHER): Payer: BC Managed Care – PPO | Admitting: Internal Medicine

## 2022-05-12 DIAGNOSIS — K260 Acute duodenal ulcer with hemorrhage: Secondary | ICD-10-CM | POA: Diagnosis not present

## 2022-05-12 DIAGNOSIS — M269 Dentofacial anomaly, unspecified: Secondary | ICD-10-CM | POA: Diagnosis not present

## 2022-05-12 DIAGNOSIS — Z21 Asymptomatic human immunodeficiency virus [HIV] infection status: Secondary | ICD-10-CM | POA: Diagnosis not present

## 2022-05-12 DIAGNOSIS — M8660 Other chronic osteomyelitis, unspecified site: Secondary | ICD-10-CM | POA: Diagnosis not present

## 2022-05-12 DIAGNOSIS — Z87828 Personal history of other (healed) physical injury and trauma: Secondary | ICD-10-CM | POA: Diagnosis not present

## 2022-05-12 DIAGNOSIS — M8668 Other chronic osteomyelitis, other site: Secondary | ICD-10-CM | POA: Diagnosis not present

## 2022-05-13 ENCOUNTER — Ambulatory Visit: Payer: BC Managed Care – PPO | Admitting: Infectious Disease

## 2022-05-13 ENCOUNTER — Encounter (HOSPITAL_BASED_OUTPATIENT_CLINIC_OR_DEPARTMENT_OTHER): Payer: BC Managed Care – PPO | Admitting: General Surgery

## 2022-05-13 DIAGNOSIS — K260 Acute duodenal ulcer with hemorrhage: Secondary | ICD-10-CM | POA: Diagnosis not present

## 2022-05-13 DIAGNOSIS — M269 Dentofacial anomaly, unspecified: Secondary | ICD-10-CM | POA: Diagnosis not present

## 2022-05-13 DIAGNOSIS — M8660 Other chronic osteomyelitis, unspecified site: Secondary | ICD-10-CM | POA: Diagnosis not present

## 2022-05-14 ENCOUNTER — Encounter (HOSPITAL_BASED_OUTPATIENT_CLINIC_OR_DEPARTMENT_OTHER): Payer: BC Managed Care – PPO | Admitting: General Surgery

## 2022-05-14 DIAGNOSIS — K260 Acute duodenal ulcer with hemorrhage: Secondary | ICD-10-CM | POA: Diagnosis not present

## 2022-05-14 DIAGNOSIS — M269 Dentofacial anomaly, unspecified: Secondary | ICD-10-CM | POA: Diagnosis not present

## 2022-05-14 DIAGNOSIS — M8660 Other chronic osteomyelitis, unspecified site: Secondary | ICD-10-CM | POA: Diagnosis not present

## 2022-05-14 NOTE — Progress Notes (Addendum)
[name], [name] ([id number]) Visit Report for 05/14/2022 HBO Details Patient Name: Date of Service: Jimmy [name]. 05/14/2022 10:00 A M Medical Record Number: [id number] Patient Account Number: [id number] Date of Birth/Sex: Treating RN: 1966-01-11 (56 y.o. 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 HBO Treatment Details Treatment Number: 9 Patient Type: Outpatient Chamber Type: Monoplace Chamber Serial #: [id number] Treatment Protocol: 2.0 ATA with 90 minutes oxygen, with two 5 minute air breaks Treatment Details Compression Rate Down: 2.0 psi / minute De-Compression Rate Up: A breaks and breathing ir Compress Tx Pressure periods Decompress Decompress Begins Reached (leave unused spaces Begins Ends blank) Chamber Pressure (ATA 1 2 2 2 2 2  --2 1 ) Clock Time (24 hr) 10:17 10:27 10:57 11:02 11:32 11:37 - - 12:07 12:20 Treatment Length: 123 (minutes) Treatment Segments: 4 Vital Signs Capillary Blood Glucose Reference Range: 80 - 120 mg / dl HBO Diabetic Blood Glucose Intervention Range: <131 mg/dl or mg/dl Time Vitals Blood Respiratory Capillary Blood Glucose Pulse Action Type: Pulse: Temperature: Taken: Pressure: Rate: Glucose (mg/dl): Meter #: Oximetry (%) Taken: Pre 10:15 101/68 79 16 97.8 Post 12:22 112/72 70 18 98 Treatment Response Treatment Toleration: Well Treatment Completion Status: Treatment Completed without Adverse Event Physician HBO Attestation: I certify that I supervised this HBO treatment in accordance with Medicare guidelines. A trained emergency response team is readily available  per Yes hospital policies and procedures. Continue HBOT as ordered. Yes Electronic Signature(s) Signed: 05/14/2022 1:06:25 PM By: 05/16/2022 EMT Signed: 05/14/2022 2:00:21 PM By: 05/16/2022 MD FACS Previous Signature: 05/14/2022 11:45:48 AM Version By: 05/16/2022 MD FACS Previous Signature: 05/14/2022 11:09:49 AM Version By: 05/16/2022 EMT Entered By: [name] on 05/14/2022 13:06:25 -------------------------------------------------------------------------------- HBO Safety Checklist Details Patient Name: Date of Service: 05/16/2022 [name] 05/14/2022 10:00 A M Medical Record Number: 05/16/2022 Patient Account Number: [id number] Date of Birth/Sex: Treating RN: [birth year] (56 y.o. 54, Jimmy [name] Primary Care Verma Grothaus: [name] Other Clinician: [name] Referring Monquie Fulgham: Treating Tylee Newby/Extender: [name] in Treatment: 2 HBO Safety Checklist Items Safety Checklist Consent Form Signed Patient voided / foley secured and emptied When did you last eato 0630 Last dose of injectable or oral agent NA Ostomy pouch emptied and vented if applicable NA All implantable devices assessed, documented and approved NA Intravenous access site secured and place NA Valuables secured Linens and cotton and cotton/polyester blend (less than 51% polyester) Personal oil-based products / skin lotions / body lotions removed Wigs or hairpieces removed NA Smoking or tobacco materials removed Books / newspapers / magazines / loose paper removed Cologne, aftershave, perfume and deodorant removed Jewelry removed (may wrap wedding band) NA Make-up removed NA Hair care products removed Battery operated devices (external) removed Heating patches and chemical warmers removed Titanium eyewear removed NA Nail polish cured greater than 10 hours NA Casting material cured greater than 10 hours NA Hearing aids removed NA Loose dentures  or partials removed NA Prosthetics have been removed NA Patient demonstrates correct use of air break device (if applicable) Patient concerns have been addressed Patient grounding bracelet on and cord attached to chamber Specifics for Inpatients (complete in addition to above) Medication sheet sent with patient NA  Intravenous medications needed or due during therapy sent with patient NA Drainage tubes (e.g. nasogastric tube or chest tube secured and vented) NA Endotracheal or Tracheotomy tube secured NA Cuff deflated of air and inflated with saline NA Airway suctioned NA Notes The safety checklist was done before treatment started Electronic Signature(s) Signed: 05/14/2022 11:08:44 AM By: [name] EMT Entered By: [name] on 05/14/2022 11:08:44

## 2022-05-14 NOTE — Progress Notes (Addendum)
JEDADIAH, ABDALLAH ([id number]) Visit Report for 05/14/2022 Arrival Information Details Patient Name: Date of Service: [name] 05/14/2022 10:00 A M Medical Record Number: [id number] Patient Account Number: [id number] Date of Birth/Sex: Treating RN: January 13, 1966 (56 y.o. [name], [name] Primary Care Constancia Geeting: [name] Other Clinician: [name] Referring Karelyn Brisby: Treating Manisha Cancel/Extender: [name] in Treatment: 2 Visit Information History Since Last Visit All ordered tests and consults were completed: Yes Patient Arrived: Ambulatory Added or deleted any medications: No Arrival Time: 09:28 Any new allergies or adverse reactions: No Accompanied By: None Had a fall or experienced change in No Transfer Assistance: None activities of daily living that may affect Patient Identification Verified: Yes risk of falls: Secondary Verification Process Completed: Yes Signs or symptoms of abuse/neglect since last visito No Patient Requires Transmission-Based Precautions: No Hospitalized since last visit: No Patient Has Alerts: No Implantable device outside of the clinic excluding No cellular tissue based products placed in the center since last visit: Pain Present Now: No Electronic Signature(s) Signed: 05/14/2022 11:07:13 AM By: [name] EMT Entered By: [name] on 05/14/2022 11:07:13 -------------------------------------------------------------------------------- Encounter Discharge Information Details Patient Name: Date of Service: [name] 05/14/2022 10:00 A M Medical Record Number: [id number] Patient Account Number: [id number] Date of Birth/Sex: Treating RN: 23-Oct-1966 (56 y.o. [name] Primary Care Yuki Purves: [name] Other Clinician: [name] Referring Tristina Sahagian: Treating [name]/Extender: [name] in Treatment: 2 Encounter Discharge Information Items Discharge  Condition: Stable Ambulatory Status: Ambulatory Discharge Destination: Home Transportation: Private Auto Accompanied By: None Schedule Follow-up Appointment: Yes Clinical Summary of Care: Electronic Signature(s) Signed: 05/14/2022 1:07:25 PM By: [name] EMT Entered By: [name] on 05/14/2022 13:07:25 -------------------------------------------------------------------------------- Vitals Details Patient Name: Date of Service: [name] 05/14/2022 10:00 A M Medical Record Number: [id number] Patient Account Number: [id number] Date of Birth/Sex: Treating RN: [birth year] (56 y.o. [name], [name] Primary Care Jannat Rosemeyer: [name] Other Clinician: [name] Referring Fletcher Ostermiller: Treating Onesti Bonfiglio/Extender: [name] in Treatment: 2 Vital Signs Time Taken: 10:15 Temperature (F): 97.8 Height (in): 71 Pulse (bpm): 79 Weight (lbs): 190 Respiratory Rate (breaths/min): 16 Body Mass Index (BMI): 26.5 Blood Pressure (mmHg): 101/68 Reference Range: 80 - 120 mg / dl Electronic Signature(s) Signed: 05/14/2022 11:07:41 AM By: [name] EMT Entered By: [name] on 05/14/2022 11:07:40

## 2022-05-14 NOTE — Progress Notes (Signed)
ALBARAA, SWINGLE ([id number]) Visit Report for 05/14/2022 SuperBill Details Patient Name: Date of Service: [name] 05/14/2022 Medical Record Number: [id number] Patient Account Number: [id number] Date of Birth/Sex: Treating RN: [birth year] (56 y.o. 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, unspecified Z21 Asymptomatic human immunodeficiency virus [HIV] infection status K26.0 Acute duodenal ulcer with hemorrhage Facility Procedures CPT4 Code Description Modifier Quantity [id number] G0277-(Facility Use Only) HBOT full body chamber, , 4 ICD-10 Diagnosis Description M86.68 Other chronic osteomyelitis, other site Z87.828 Personal history of other (healed) physical injury and trauma 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, unspecified Z21 Asymptomatic human immunodeficiency virus [HIV] infection status Physician Procedures Quantity CPT4 Code Description Modifier [id number] [phone] - WC PHYS HYPERBARIC OXYGEN THERAPY 1 ICD-10 Diagnosis Description M86.68 Other chronic osteomyelitis, other site Z87.828 Personal history of other (healed) physical injury and trauma 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

## 2022-05-15 ENCOUNTER — Encounter (HOSPITAL_BASED_OUTPATIENT_CLINIC_OR_DEPARTMENT_OTHER): Payer: BC Managed Care – PPO | Admitting: Internal Medicine

## 2022-05-15 DIAGNOSIS — M8660 Other chronic osteomyelitis, unspecified site: Secondary | ICD-10-CM | POA: Diagnosis not present

## 2022-05-15 DIAGNOSIS — Z87828 Personal history of other (healed) physical injury and trauma: Secondary | ICD-10-CM

## 2022-05-15 DIAGNOSIS — K260 Acute duodenal ulcer with hemorrhage: Secondary | ICD-10-CM | POA: Diagnosis not present

## 2022-05-15 DIAGNOSIS — M269 Dentofacial anomaly, unspecified: Secondary | ICD-10-CM

## 2022-05-15 DIAGNOSIS — M8668 Other chronic osteomyelitis, other site: Secondary | ICD-10-CM | POA: Diagnosis not present

## 2022-05-15 DIAGNOSIS — Z21 Asymptomatic human immunodeficiency virus [HIV] infection status: Secondary | ICD-10-CM | POA: Diagnosis not present

## 2022-05-15 NOTE — Progress Notes (Signed)
DATON, SZILAGYI ([id number]) Visit Report for 05/15/2022 HBO Safety Checklist Details Patient Name: Date of Service: Jimmy [name]. 05/15/2022 10:00 A M Medical Record Number: [id number] Patient Account Number: [id number] Date of Birth/Sex: Treating RN: [birth year] (56 y.o. [name] Primary Care Jazon Jipson: [name] Other Clinician: [name] Referring Timiya Howells: Treating Kadiatou Oplinger/Extender: [name] in Treatment: 3 HBO Safety Checklist Items Safety Checklist Consent Form Signed Patient voided / foley secured and emptied When did you last eato 0600 Last dose of injectable or oral agent NA Ostomy pouch emptied and vented if applicable NA All implantable devices assessed, documented and approved NA Intravenous access site secured and place NA Valuables secured Linens and cotton and cotton/polyester blend (less than 51% polyester) Personal oil-based products / skin lotions / body lotions removed Wigs or hairpieces removed NA Smoking or tobacco materials removed Books / newspapers / magazines / loose paper removed Cologne, aftershave, perfume and deodorant removed Jewelry removed (may wrap wedding band) NA Make-up removed NA Hair care products removed Battery operated devices (external) removed Heating patches and chemical warmers removed Titanium eyewear removed NA Nail polish cured greater than 10 hours NA Casting material cured greater than 10 hours NA Hearing aids removed NA Loose dentures or partials removed NA Prosthetics have been removed Patient demonstrates correct use of air break device (if applicable) Patient concerns have been addressed Patient grounding bracelet on and cord attached to chamber Specifics for Inpatients (complete in addition to above) Medication sheet sent with patient NA Intravenous medications needed or due during therapy sent with patient NA Drainage tubes (e.g. nasogastric tube or  chest tube secured and vented) NA Endotracheal or Tracheotomy tube secured NA Cuff deflated of air and inflated with saline NA Airway suctioned NA Notes The safety checklist was done before treatment started Electronic Signature(s) Signed: 05/15/2022 12:15:57 PM By: [name] EMT Entered By: [name] on 05/15/2022 12:15:57

## 2022-05-15 NOTE — Progress Notes (Addendum)
JAQUARIOUS, GREY ([id number]) Visit Report for 05/15/2022 Arrival Information Details Patient Name: Date of Service: [name] 05/15/2022 10:00 A M Medical Record Number: [id number] Patient Account Number: [id number] Date of Birth/Sex: Treating RN: 06-16-1966 (56 y.o. [name], [name] Primary Care Tonantzin Mimnaugh: [name] Other Clinician: [name] Referring Kenley Troop: Treating Jakai Onofre/Extender: [name] in Treatment: 3 Visit Information History Since Last Visit All ordered tests and consults were completed: Yes Patient Arrived: Ambulatory Added or deleted any medications: No Arrival Time: 09:22 Any new allergies or adverse reactions: No Accompanied By: None Had a fall or experienced change in No Transfer Assistance: None activities of daily living that may affect Patient Identification Verified: Yes risk of falls: Secondary Verification Process Completed: Yes Signs or symptoms of abuse/neglect since last visito No Patient Requires Transmission-Based Precautions: No Hospitalized since last visit: No Patient Has Alerts: No Implantable device outside of the clinic excluding No cellular tissue based products placed in the center since last visit: Pain Present Now: No Electronic Signature(s) Signed: 05/15/2022 12:14:06 PM By: [name] EMT Entered By: [name] on 05/15/2022 12:14:06 -------------------------------------------------------------------------------- Encounter Discharge Information Details Patient Name: Date of Service: [name] 05/15/2022 10:00 A M Medical Record Number: [id number] Patient Account Number: [id number] Date of Birth/Sex: Treating RN: 1966/03/14 (56 y.o. [name] Primary Care Donivan Thammavong: [name] Other Clinician: [name] Referring Edy Belt: Treating Kimberlin Scheel/Extender: [name] in Treatment: 3 Encounter Discharge Information  Items Discharge Condition: Stable Ambulatory Status: Ambulatory Discharge Destination: Home Transportation: Private Auto Accompanied By: None Schedule Follow-up Appointment: Yes Clinical Summary of Care: Electronic Signature(s) Signed: 05/15/2022 12:29:22 PM By: [name] EMT Entered By: [name] on 05/15/2022 12:29:21 -------------------------------------------------------------------------------- Vitals Details Patient Name: Date of Service: [name] 05/15/2022 10:00 A M Medical Record Number: [id number] Patient Account Number: [id number] Date of Birth/Sex: Treating RN: 08-Jul-1966 (56 y.o. [name] Primary Care Rebie Peale: [name] Other Clinician: [name] Referring Wyndham Santilli: Treating Kile Kabler/Extender: [name] in Treatment: 3 Vital Signs Time Taken: 10:18 Temperature (F): 97.9 Height (in): 71 Pulse (bpm): 79 Weight (lbs): 190 Respiratory Rate (breaths/min): 16 Body Mass Index (BMI): 26.5 Blood Pressure (mmHg): 96/61 Reference Range: 80 - 120 mg / dl Electronic Signature(s) Signed: 05/15/2022 12:14:31 PM By: [name] EMT Entered By: [name] on 05/15/2022 12:14:31

## 2022-05-18 ENCOUNTER — Encounter (HOSPITAL_BASED_OUTPATIENT_CLINIC_OR_DEPARTMENT_OTHER): Payer: BC Managed Care – PPO | Admitting: General Surgery

## 2022-05-20 ENCOUNTER — Encounter (HOSPITAL_BASED_OUTPATIENT_CLINIC_OR_DEPARTMENT_OTHER): Payer: BC Managed Care – PPO | Attending: General Surgery | Admitting: General Surgery

## 2022-05-20 DIAGNOSIS — Z21 Asymptomatic human immunodeficiency virus [HIV] infection status: Secondary | ICD-10-CM | POA: Insufficient documentation

## 2022-05-20 DIAGNOSIS — M8668 Other chronic osteomyelitis, other site: Secondary | ICD-10-CM | POA: Insufficient documentation

## 2022-05-20 DIAGNOSIS — M269 Dentofacial anomaly, unspecified: Secondary | ICD-10-CM | POA: Diagnosis not present

## 2022-05-20 DIAGNOSIS — Z87828 Personal history of other (healed) physical injury and trauma: Secondary | ICD-10-CM | POA: Diagnosis not present

## 2022-05-20 NOTE — Progress Notes (Signed)
ABDOULAYE, DRUM ([id number]) Visit Report for 05/20/2022 Problem List Details Patient Name: Date of Service: [name] 05/20/2022 10:00 A M Medical Record Number: [id number] Patient Account Number: [id number] Date of Birth/Sex: Treating RN: March 14, 1966 (56 y.o. 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 05/20/2022 Medical Record Number: [id number] Patient Account Number: [id number] Date of Birth/Sex: Treating RN: 19-Apr-1966 (56 y.o. 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, unspecified Z21 Asymptomatic human immunodeficiency virus [HIV] infection status K26.0 Acute duodenal ulcer with hemorrhage Facility  Procedures CPT4 Code: [id number] Description: G0277-(Facility Use Only) HBOT full body chamber, , ICD-10 Diagnosis Description M86.68 Other chronic osteomyelitis, other site Z87.828 Personal history of other (healed) physical injury and trauma 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,  unspecified Z21 Asymptomatic human immunodeficiency virus [HIV] infection status Modifier: Quantity: 4 Physician Procedures : CPT4 Code Description Modifier [id number] 21194 - WC PHYS HYPERBARIC OXYGEN THERAPY ICD-10 Diagnosis Description M86.68 Other chronic osteomyelitis, other site Z87.828 Personal history of other (healed) physical injury and trauma M26.9 Dentofacial  anomaly, unspecified Z21 Asymptomatic human immunodeficiency virus [HIV] infection status Quantity: 1 Electronic Signature(s) Signed: 05/20/2022 2:46:03 PM By: [name] EMT Signed: 05/20/2022 2:54:17 PM By: [name] Entered By: [name] on 05/20/2022 14:46:02

## 2022-05-21 ENCOUNTER — Ambulatory Visit (INDEPENDENT_AMBULATORY_CARE_PROVIDER_SITE_OTHER): Payer: BC Managed Care – PPO

## 2022-05-21 ENCOUNTER — Encounter (HOSPITAL_BASED_OUTPATIENT_CLINIC_OR_DEPARTMENT_OTHER): Payer: BC Managed Care – PPO | Admitting: General Surgery

## 2022-05-21 VITALS — BP 109/68 | HR 70 | Temp 98.7°F | Resp 16 | Ht 71.0 in | Wt 193.6 lb

## 2022-05-21 DIAGNOSIS — D509 Iron deficiency anemia, unspecified: Secondary | ICD-10-CM

## 2022-05-21 DIAGNOSIS — Z87828 Personal history of other (healed) physical injury and trauma: Secondary | ICD-10-CM | POA: Diagnosis not present

## 2022-05-21 DIAGNOSIS — D62 Acute posthemorrhagic anemia: Secondary | ICD-10-CM | POA: Diagnosis not present

## 2022-05-21 DIAGNOSIS — M8668 Other chronic osteomyelitis, other site: Secondary | ICD-10-CM | POA: Diagnosis not present

## 2022-05-21 DIAGNOSIS — Z21 Asymptomatic human immunodeficiency virus [HIV] infection status: Secondary | ICD-10-CM | POA: Diagnosis not present

## 2022-05-21 DIAGNOSIS — M269 Dentofacial anomaly, unspecified: Secondary | ICD-10-CM | POA: Diagnosis not present

## 2022-05-21 MED ORDER — SODIUM CHLORIDE 0.9 % IV SOLN
510.0000 mg | Freq: Once | INTRAVENOUS | Status: AC
  Administered 2022-05-21: 510 mg via INTRAVENOUS
  Filled 2022-05-21: qty 17

## 2022-05-21 NOTE — Progress Notes (Addendum)
Jimmy [name], Jimmy [name] ([id number]) Visit Report for 05/21/2022 Arrival Information Details Patient Name: Date of Service: Jimmy [name] 05/21/2022 10:00 A M Medical Record Number: [id number] Patient Account Number: [id number] Date of Birth/Sex: Treating RN: 03/17/1966 (56 y.o. [name] Primary Care Markeia Harkless: [name] Other Clinician: [name] Referring Vicktoria Muckey: Treating Analy Bassford/Extender: [name] in Treatment: 3 Visit Information History Since Last Visit All ordered tests and consults were completed: Yes Patient Arrived: Ambulatory Added or deleted any medications: No Arrival Time: 09:40 Any new allergies or adverse reactions: No Accompanied By: self Had a fall or experienced change in No Transfer Assistance: None activities of daily living that may affect Patient Identification Verified: Yes risk of falls: Secondary Verification Process Completed: Yes Signs or symptoms of abuse/neglect since last visito No Patient Requires Transmission-Based Precautions: No Hospitalized since last visit: No Patient Has Alerts: No Implantable device outside of the clinic excluding No cellular tissue based products placed in the center since last visit: Pain Present Now: No Electronic Signature(s) Signed: 05/21/2022 1:41:31 PM By: [name] CHT EMT BS , , Entered By: [name] on 05/21/2022 13:31:06 -------------------------------------------------------------------------------- Encounter Discharge Information Details Patient Name: Date of Service: Jimmy Nian [name] 05/21/2022 10:00 A M Medical Record Number: [id number] Patient Account Number: [id number] Date of Birth/Sex: Treating RN: Sep 13, 1966 (56 y.o. [name] Primary Care Franceen Erisman: [name] Other Clinician: [name] Referring Anjana Cheek: Treating Vonzella Althaus/Extender: [name] in Treatment: 3 Encounter Discharge  Information Items Discharge Condition: Stable Ambulatory Status: Ambulatory Discharge Destination: Home Transportation: Private Auto Accompanied By: Wife Schedule Follow-up Appointment: Yes Clinical Summary of Care: Electronic Signature(s) Signed: 05/21/2022 2:00:38 PM By: [name] EMT Entered By: [name] on 05/21/2022 14:00:38 -------------------------------------------------------------------------------- Vitals Details Patient Name: Date of Service: Jimmy [name] 05/21/2022 10:00 A M Medical Record Number: [id number] Patient Account Number: [id number] Date of Birth/Sex: Treating RN: 1966/10/09 (56 y.o. [name] Primary Care Richele Strand: [name] Other Clinician: [name] Referring Gerlad Pelzel: Treating Bayyinah Dukeman/Extender: [name] in Treatment: 3 Vital Signs Time Taken: 10:14 Temperature (F): 98.1 Height (in): 71 Pulse (bpm): 72 Weight (lbs): 190 Respiratory Rate (breaths/min): 16 Body Mass Index (BMI): 26.5 Blood Pressure (mmHg): 102/58 Reference Range: 80 - 120 mg / dl Electronic Signature(s) Signed: 05/21/2022 1:41:31 PM By: [name] CHT EMT BS , , Entered By: [name] on 05/21/2022 13:31:54

## 2022-05-21 NOTE — Progress Notes (Addendum)
Jimmy [name], Jimmy [name] ([id number]) Visit Report for 05/21/2022 HBO Details Patient Name: Date of Service: [name] 05/21/2022 10:00 A M Medical Record Number: [id number] Patient Account Number: [id number] Date of Birth/Sex: Treating RN: November 04, 1966 (56 y.o. 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 HBO Treatment Details Treatment Number: 12 Patient Type: Outpatient Chamber Type: Monoplace Chamber Serial #: [id number] Treatment Protocol: 2.0 ATA with 90 minutes oxygen, with two 5 minute air breaks Treatment Details Compression Rate Down: 2.0 psi / minute De-Compression Rate Up: 2.0 psi / minute A breaks and breathing ir Compress Tx Pressure periods Decompress Decompress Begins Reached (leave unused spaces Begins Ends blank) Chamber Pressure (ATA 1 2 2 2 2 2  --2 1 ) Clock Time (24 hr) 10:18 10:26 10:56 11:01 11:32 11:37 - - 12:07 12:17 Treatment Length: 119 (minutes) Treatment Segments: 4 Vital Signs Capillary Blood Glucose Reference Range: 80 - 120 mg / dl HBO Diabetic Blood Glucose Intervention Range: <131 mg/dl or mg/dl Time Vitals Blood Respiratory Capillary Blood Glucose Pulse Action Type: Pulse: Temperature: Taken: Pressure: Rate: Glucose (mg/dl): Meter #: Oximetry (%) Taken: Pre 10:14 102/58 72 16 98.1 Post 12:22 100/70 75 18 98.1 Treatment Response Treatment Toleration: Well Treatment Completion Status: Treatment Completed without Adverse Event Physician HBO Attestation: I certify that I supervised this HBO treatment in accordance with Medicare guidelines. A trained emergency response team  is readily available per Yes hospital policies and procedures. Continue HBOT as ordered. Yes Electronic Signature(s) Signed: 05/21/2022 3:24:51 PM By: 07/22/2022 MD FACS Previous Signature: 05/21/2022 1:59:31 PM Version By: 07/22/2022 EMT Entered By: [name] on 05/21/2022 15:24:51 -------------------------------------------------------------------------------- HBO Safety Checklist Details Patient Name: Date of Service: 07/22/2022 [name] 05/21/2022 10:00 A M Medical Record Number: 07/22/2022 Patient Account Number: [id number] Date of Birth/Sex: Treating RN: 06/08/1966 (56 y.o. 54 Primary Care Hesper Venturella: [name] Other Clinician: [name] Referring Shila Kruczek: Treating Chelsea Nusz/Extender: [name] in Treatment: 3 HBO Safety Checklist Items Safety Checklist Consent Form Signed Patient voided / foley secured and emptied When did you last eato 0600 Last dose of injectable or oral agent n/a Ostomy pouch emptied and vented if applicable NA All implantable devices assessed, documented and approved NA Intravenous access site secured and place NA Valuables secured Linens and cotton and cotton/polyester blend (less than 51% polyester) Personal oil-based products / skin lotions / body lotions removed Wigs or hairpieces removed NA Smoking or tobacco materials removed NA Books / newspapers / magazines / loose paper removed Cologne, aftershave, perfume and deodorant removed Jewelry removed (may wrap wedding band) Make-up removed NA Hair care products removed Battery operated devices (external) removed Heating patches and chemical warmers removed Titanium eyewear removed NA Nail polish cured greater than 10 hours NA Casting material cured greater than 10 hours NA Hearing aids removed NA Loose dentures or partials removed NA Prosthetics have been removed NA Patient demonstrates correct use of air break  device (if applicable) Patient concerns have been addressed Patient grounding bracelet on and cord attached to chamber Specifics for Inpatients (complete in addition to above) Medication sheet sent with patient NA Intravenous medications needed or due during therapy sent with patient NA Drainage tubes (e.g. nasogastric  tube or chest tube secured and vented) NA Endotracheal or Tracheotomy tube secured NA Cuff deflated of air and inflated with saline NA Airway suctioned NA Notes Paper version used prior to treatment start. Electronic Signature(s) Signed: 05/21/2022 1:41:31 PM By: [name] CHT EMT BS , , Entered By: [name] on 05/21/2022 13:36:18

## 2022-05-21 NOTE — Progress Notes (Signed)
KENDELL, SAGRAVES ([id number]) Visit Report for 05/21/2022 Problem List Details Patient Name: Date of Service: [name] 05/21/2022 10:00 A M Medical Record Number: [id number] Patient Account Number: [id number] Date of Birth/Sex: Treating RN: Sep 21, 1966 (56 y.o. 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 05/21/2022 Medical Record Number: [id number] Patient Account Number: [id number] Date of Birth/Sex: Treating RN: June 14, 1966 (56 y.o. 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, unspecified Z21 Asymptomatic human immunodeficiency virus [HIV] infection status K26.0 Acute duodenal ulcer with hemorrhage Facility  Procedures CPT4 Code: [id number] Description: G0277-(Facility Use Only) HBOT full body chamber, , ICD-10 Diagnosis Description M86.68 Other chronic osteomyelitis, other site Z87.828 Personal history of other (healed) physical injury and trauma 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,  unspecified Z21 Asymptomatic human immunodeficiency virus [HIV] infection status Modifier: Quantity: 4 Physician Procedures : CPT4 Code Description Modifier [id number] 56387 - WC PHYS HYPERBARIC OXYGEN THERAPY ICD-10 Diagnosis Description M86.68 Other chronic osteomyelitis, other site Z87.828 Personal history of other (healed) physical injury and trauma M26.9 Dentofacial  anomaly, unspecified Z21 Asymptomatic human immunodeficiency virus [HIV] infection status Quantity: 1 Electronic Signature(s) Signed: 05/21/2022 2:00:03 PM By: [name] EMT Signed: 05/21/2022 2:32:39 PM By: [name] Entered By: [name] on 05/21/2022 14:00:03

## 2022-05-21 NOTE — Progress Notes (Signed)
Jimmy [name], Jimmy [name] ([id number]) Visit Report for 05/20/2022 HBO Details Patient Name: Date of Service: Jimmy [name]. 05/20/2022 10:00 A M Medical Record Number: [id number] Patient Account Number: [id number] Date of Birth/Sex: Treating RN: May 11, 1966 (56 y.o. 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 HBO Treatment Details Treatment Number: 11 Patient Type: Outpatient Chamber Type: Monoplace Chamber Serial #: [id number] Treatment Protocol: 2.0 ATA with 90 minutes oxygen, with two 5 minute air breaks Treatment Details Compression Rate Down: 2.0 psi / minute De-Compression Rate Up: A breaks and breathing ir Compress Tx Pressure periods Decompress Decompress Begins Reached (leave unused spaces Begins Ends blank) Chamber Pressure (ATA 1 2 2 2 2 2  --2 1 ) Clock Time (24 hr) 10:23 10:33 11:03 11:08 11:38 11:43 - - 12:13 12:21 Treatment Length: 118 (minutes) Treatment Segments: 4 Vital Signs Capillary Blood Glucose Reference Range: 80 - 120 mg / dl HBO Diabetic Blood Glucose Intervention Range: <131 mg/dl or mg/dl Time Vitals Blood Respiratory Capillary Blood Glucose Pulse Action Type: Pulse: Temperature: Taken: Pressure: Rate: Glucose (mg/dl): Meter #: Oximetry (%) Taken: Pre 10:22 134/96 84 18 97.8 Post 12:24 110/82 79 16 98 Treatment Response Treatment Toleration: Well Treatment Completion Status: Treatment Completed without Adverse Event Physician HBO Attestation: I certify that I supervised this HBO treatment in accordance with Medicare guidelines. A trained emergency response team is readily  available per Yes hospital policies and procedures. Continue HBOT as ordered. Yes Electronic Signature(s) Signed: 05/20/2022 2:56:07 PM By: 07/21/2022 MD FACS Previous Signature: 05/20/2022 2:45:37 PM Version By: 07/21/2022 EMT Entered By: [name] on 05/20/2022 14:56:07 -------------------------------------------------------------------------------- HBO Safety Checklist Details Patient Name: Date of Service: 07/21/2022 [name] 05/20/2022 10:00 A M Medical Record Number: 07/21/2022 Patient Account Number: [id number] Date of Birth/Sex: Treating RN: 1966-02-05 (56 y.o. 53 Primary Care [name]: [name] Other Clinician: [name] Referring [name]: Treating [name]: [name] in Treatment: 3 HBO Safety Checklist Items Safety Checklist Consent Form Signed Patient voided / foley secured and emptied When did you last eato 0700 Last dose of injectable or oral agent n/a Ostomy pouch emptied and vented if applicable NA All implantable devices assessed, documented and approved NA Intravenous access site secured and place NA Valuables secured Linens and cotton and cotton/polyester blend (less than 51% polyester) Personal oil-based products / skin lotions / body lotions removed Wigs or hairpieces removed NA Smoking or tobacco materials removed NA Books / newspapers / magazines / loose paper removed Cologne, aftershave, perfume and deodorant removed Jewelry removed (may wrap wedding band) Make-up removed NA Hair care products removed Battery operated devices (external) removed Heating patches and chemical warmers removed Titanium eyewear removed NA Nail polish cured greater than 10 hours NA Casting material cured greater than 10 hours NA Hearing aids removed NA Loose dentures or partials removed NA Prosthetics have been removed NA Patient demonstrates correct use of air break device (if  applicable) Patient concerns have been addressed Patient grounding bracelet on and cord attached to chamber Specifics for Inpatients (complete in addition to above) Medication sheet sent with patient NA Intravenous medications needed or due during therapy sent with patient NA Drainage tubes (e.g. nasogastric tube or chest tube  secured and vented) NA Endotracheal or Tracheotomy tube secured NA Cuff deflated of air and inflated with saline NA Airway suctioned NA Notes Paper version used prior to treatment start. Electronic Signature(s) Signed: 05/21/2022 1:41:31 PM By: [name] CHT EMT BS , , Entered By: [name] on 05/20/2022 11:46:14

## 2022-05-21 NOTE — Progress Notes (Signed)
Diagnosis: Iron Deficiency Anemia  Provider:  Chilton Greathouse, MD  Procedure: Infusion  IV Type: Peripheral, IV Location: L Hand  Feraheme (Ferumoxytol), Dose: 510 mg  Infusion Start Time: 1304  Infusion Stop Time: 1325  Post Infusion IV Care: Peripheral IV Discontinued  Discharge: Condition: Good, Destination: Home . AVS provided to patient.   Performed by:  Marilynn Rail, RN

## 2022-05-21 NOTE — Progress Notes (Signed)
Jimmy [name], Jimmy [name] ([id number]) Visit Report for 05/20/2022 Arrival Information Details Patient Name: Date of Service: Jimmy [name]. 05/20/2022 10:00 A M Medical Record Number: [id number] Patient Account Number: [id number] Date of Birth/Sex: Treating RN: 04-Nov-1966 (56 y.o. Jimmy [name], Jimmy [name] Primary Care Jimmy [name]: Jimmy [name] Other Clinician: Haywood [name] Referring Analycia [name]: Treating Jimmy [name]/Extender: Jimmy [name] in Treatment: 3 Visit Information History Since Last Visit All ordered tests and consults were completed: Yes Patient Arrived: Ambulatory Added or deleted any medications: No Arrival Time: 09:23 Any new allergies or adverse reactions: No Accompanied By: self Had a fall or experienced change in No Transfer Assistance: None activities of daily living that may affect Patient Identification Verified: Yes risk of falls: Secondary Verification Process Completed: Yes Signs or symptoms of abuse/neglect since last visito No Patient Requires Transmission-Based Precautions: No Hospitalized since last visit: No Patient Has Alerts: No Implantable device outside of the clinic excluding No cellular tissue based products placed in the center since last visit: Pain Present Now: No Electronic Signature(s) Signed: 05/21/2022 1:41:31 PM By: Jimmy [name] CHT EMT BS , , Entered By: Jimmy [name] on 05/20/2022 11:30:34 -------------------------------------------------------------------------------- Encounter Discharge Information Details Patient Name: Date of Service: Jimmy Nian [name] 05/20/2022 10:00 A M Medical Record Number: [id number] Patient Account Number: [id number] Date of Birth/Sex: Treating RN: [birth year] (56 y.o. Jimmy [name] Primary Care Amarionna Arca: Jimmy [name] Other Clinician: Karl [name] Referring Jimmy [name]: Treating Jimmy [name]/Extender: Jimmy [name] in Treatment: 3 Encounter Discharge  Information Items Discharge Condition: Stable Ambulatory Status: Ambulatory Discharge Destination: Home Transportation: Private Auto Accompanied By: Wife Schedule Follow-up Appointment: Yes Clinical Summary of Care: Electronic Signature(s) Signed: 05/20/2022 2:46:44 PM By: Jimmy [name] EMT Entered By: Jimmy [name] on 05/20/2022 14:46:44 -------------------------------------------------------------------------------- Vitals Details Patient Name: Date of Service: Jimmy [name] 05/20/2022 10:00 A M Medical Record Number: [id number] Patient Account Number: [id number] Date of Birth/Sex: Treating RN: [birth year] (56 y.o. Jimmy [name] Primary Care Camella Seim: Jimmy [name] Other Clinician: Haywood [name] Referring Ranelle Auker: Treating Lashawndra Lampkins/Extender: Jimmy [name] in Treatment: 3 Vital Signs Time Taken: 10:22 Temperature (F): 97.8 Height (in): 71 Pulse (bpm): 84 Weight (lbs): 190 Respiratory Rate (breaths/min): 18 Body Mass Index (BMI): 26.5 Blood Pressure (mmHg): 134/96 Reference Range: 80 - 120 mg / dl Electronic Signature(s) Signed: 05/21/2022 1:41:31 PM By: Jimmy [name] CHT EMT BS , , Entered By: Jimmy [name] on 05/20/2022 11:45:05

## 2022-05-21 NOTE — Progress Notes (Signed)
Jimmy [name], Jimmy [name] ([id number]) Visit Report for 05/15/2022 Problem List Details Patient Name: Date of Service: [name] 05/15/2022 10:00 A M Medical Record Number: [id number] Patient Account Number: [id number] Date of Birth/Sex: Treating RN: 28-Jan-1966 (56 y.o. 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 05/15/2022 Medical Record Number: [id number] Patient Account Number: [id number] Date of Birth/Sex: Treating RN: 13-Aug-1966 (56 y.o. 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, unspecified Z21 Asymptomatic human immunodeficiency virus [HIV] infection status K26.0 Acute duodenal ulcer with hemorrhage Facility  Procedures CPT4 Code: [id number] Description: G0277-(Facility Use Only) HBOT full body chamber, , ICD-10 Diagnosis Description M86.68 Other chronic osteomyelitis, other site Z87.828 Personal history of other (healed) physical injury and trauma 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,  unspecified Z21 Asymptomatic human immunodeficiency virus [HIV] infection status Modifier: Quantity: 4 Physician Procedures : CPT4 Code Description Modifier [id number] - WC PHYS HYPERBARIC OXYGEN THERAPY ICD-10 Diagnosis Description M86.68 Other chronic osteomyelitis, other site Z87.828 Personal history of other (healed) physical injury and trauma M26.9 Dentofacial  anomaly, unspecified Z21 Asymptomatic human immunodeficiency virus [HIV] infection status Quantity: 1 Electronic Signature(s) Signed: 05/15/2022 12:26:34 PM By: [name] EMT Signed: 05/21/2022 2:41:26 PM By: Geralyn Corwin DO Entered By: [name] on 05/15/2022 12:26:34

## 2022-05-22 ENCOUNTER — Encounter (HOSPITAL_BASED_OUTPATIENT_CLINIC_OR_DEPARTMENT_OTHER): Payer: BC Managed Care – PPO | Admitting: Internal Medicine

## 2022-05-22 DIAGNOSIS — Z87828 Personal history of other (healed) physical injury and trauma: Secondary | ICD-10-CM

## 2022-05-22 DIAGNOSIS — Z21 Asymptomatic human immunodeficiency virus [HIV] infection status: Secondary | ICD-10-CM

## 2022-05-22 DIAGNOSIS — M8668 Other chronic osteomyelitis, other site: Secondary | ICD-10-CM | POA: Diagnosis not present

## 2022-05-22 DIAGNOSIS — M269 Dentofacial anomaly, unspecified: Secondary | ICD-10-CM

## 2022-05-22 NOTE — Progress Notes (Addendum)
MAURION, WALKOWIAK ([id number]) Visit Report for 05/22/2022 Arrival Information Details Patient Name: Date of Service: [name] 05/22/2022 10:00 A M Medical Record Number: [id number] Patient Account Number: [id number] Date of Birth/Sex: Treating RN: 11-Sep-1966 (56 y.o. [name] Primary Care Holy Battenfield: [name] Other Clinician: [name] Referring Auren Valdes: Treating Margee Trentham/Extender: [name] in Treatment: 4 Visit Information History Since Last Visit All ordered tests and consults were completed: Yes Patient Arrived: Ambulatory Added or deleted any medications: No Arrival Time: 09:40 Any new allergies or adverse reactions: No Accompanied By: Wife Had a fall or experienced change in No Transfer Assistance: None activities of daily living that may affect Patient Identification Verified: Yes risk of falls: Secondary Verification Process Completed: Yes Signs or symptoms of abuse/neglect since last visito No Patient Requires Transmission-Based Precautions: No Hospitalized since last visit: No Patient Has Alerts: No Implantable device outside of the clinic excluding No cellular tissue based products placed in the center since last visit: Pain Present Now: No Electronic Signature(s) Signed: 05/22/2022 2:04:21 PM By: [name] EMT Entered By: [name] on 05/22/2022 14:04:21 -------------------------------------------------------------------------------- Encounter Discharge Information Details Patient Name: Date of Service: [name] 05/22/2022 10:00 A M Medical Record Number: [id number] Patient Account Number: [id number] Date of Birth/Sex: Treating RN: 04/03/1966 (56 y.o. [name] Primary Care Ciara Kagan: [name] Other Clinician: [name] Referring Sanye Ledesma: Treating Jayliana Valencia/Extender: [name] in Treatment: 4 Encounter Discharge Information Items Discharge  Condition: Stable Ambulatory Status: Ambulatory Discharge Destination: Home Transportation: Private Auto Accompanied By: Wife Schedule Follow-up Appointment: No Clinical Summary of Care: Electronic Signature(s) Signed: 05/22/2022 2:12:24 PM By: [name] EMT Entered By: [name] on 05/22/2022 14:12:24 -------------------------------------------------------------------------------- Vitals Details Patient Name: Date of Service: [name] 05/22/2022 10:00 A M Medical Record Number: [id number] Patient Account Number: [id number] Date of Birth/Sex: Treating RN: 07/27/1966 (55 y.o. [name] Primary Care Jahleel Stroschein: [name] Other Clinician: [name] Referring Jermani Eberlein: Treating Ion Gonnella/Extender: [name] in Treatment: 4 Vital Signs Time Taken: 10:02 Temperature (F): 98.2 Height (in): 71 Pulse (bpm): 79 Weight (lbs): 190 Respiratory Rate (breaths/min): 16 Body Mass Index (BMI): 26.5 Blood Pressure (mmHg): 102/70 Reference Range: 80 - 120 mg / dl Electronic Signature(s) Signed: 05/22/2022 2:05:31 PM By: [name] EMT Entered By: [name] on 05/22/2022 14:05:31

## 2022-05-22 NOTE — Progress Notes (Addendum)
RAFIK, KOPPEL ([id number]) Visit Report for 05/22/2022 HBO Details Patient Name: Date of Service: [name] 05/22/2022 10:00 A M Medical Record Number: [id number] Patient Account Number: [id number] Date of Birth/Sex: Treating RN: 07/15/1966 (56 y.o. 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 HBO Treatment Details Treatment Number: 13 Patient Type: Outpatient Chamber Type: Monoplace Chamber Serial #: [id number] Treatment Protocol: 2.0 ATA with 90 minutes oxygen, with two 5 minute air breaks Treatment Details Compression Rate Down: 2.0 psi / minute De-Compression Rate Up: 2.0 psi / minute A breaks and breathing ir Compress Tx Pressure periods Decompress Decompress Begins Reached (leave unused spaces Begins Ends blank) Chamber Pressure (ATA 1 2 2 2 2 2  --2 1 ) Clock Time (24 hr) 10:06 10:13 10:43 10:48 11:18 11:23 - - 11:53 12:09 Treatment Length: 123 (minutes) Treatment Segments: 4 Vital Signs Capillary Blood Glucose Reference Range: 80 - 120 mg / dl HBO Diabetic Blood Glucose Intervention Range: <131 mg/dl or mg/dl Time Vitals Blood Respiratory Capillary Blood Glucose Pulse Action Type: Pulse: Temperature: Taken: Pressure: Rate: Glucose (mg/dl): Meter #: Oximetry (%) Taken: Pre 10:02 102/70 79 16 98.2 Post 12:06 105/60 73 16 97.5 Treatment Response Treatment Toleration: Well Treatment Completion Status: Treatment Completed without Adverse Event Physician HBO Attestation: I certify that I supervised this HBO treatment in accordance with Medicare guidelines. A trained emergency response team  is readily available per Yes hospital policies and procedures. Continue HBOT as ordered. Yes Electronic Signature(s) Signed: 05/22/2022 2:52:31 PM By: 07/23/2022 DO Previous Signature: 05/22/2022 2:09:42 PM Version By: 07/23/2022 EMT Entered By: [name] on 05/22/2022 14:51:45 -------------------------------------------------------------------------------- HBO Safety Checklist Details Patient Name: Date of Service: 07/23/2022 [name] 05/22/2022 10:00 A M Medical Record Number: 07/23/2022 Patient Account Number: [id number] Date of Birth/Sex: Treating RN: August 31, 1966 (56 y.o. 54 Primary Care Samuell Knoble: [name] Other Clinician: [name] Referring Janina Trafton: Treating Charlize Hathaway/Extender: [name] in Treatment: 4 HBO Safety Checklist Items Safety Checklist Consent Form Signed Patient voided / foley secured and emptied When did you last eato 0630 Last dose of injectable or oral agent NA Ostomy pouch emptied and vented if applicable NA All implantable devices assessed, documented and approved NA Intravenous access site secured and place Valuables secured Linens and cotton and cotton/polyester blend (less than 51% polyester) Personal oil-based products / skin lotions / body lotions removed Wigs or hairpieces removed NA Smoking or tobacco materials removed Books / newspapers / magazines / loose paper removed Cologne, aftershave, perfume and deodorant removed Jewelry removed (may wrap wedding band) NA Make-up removed NA Hair care products removed NA Battery operated devices (external) removed Heating patches and chemical warmers removed Titanium eyewear removed NA Nail polish cured greater than 10 hours NA Casting material cured greater than 10 hours NA Hearing aids removed NA Loose dentures or partials removed NA Prosthetics have been removed NA Patient demonstrates correct use of air break device (if  applicable) Patient concerns have been addressed Patient grounding bracelet on and cord attached to chamber Specifics for Inpatients (complete in addition to above) Medication sheet sent with patient NA Intravenous medications needed or due during therapy sent with patient NA Drainage tubes (e.g. nasogastric tube  or chest tube secured and vented) NA Endotracheal or Tracheotomy tube secured NA Cuff deflated of air and inflated with saline NA Airway suctioned NA Notes Paper version used prior to treatment start. Electronic Signature(s) Signed: 05/22/2022 2:07:00 PM By: [name] EMT Entered By: [name] on 05/22/2022 14:07:00

## 2022-05-22 NOTE — Progress Notes (Signed)
Jimmy [name], Jimmy [name] ([id number]) Visit Report for 05/22/2022 Problem List Details Patient Name: Date of Service: Jimmy [name]. 05/22/2022 10:00 A M Medical Record Number: [id number] Patient Account Number: [id number] Date of Birth/Sex: Treating RN: May 05, 1966 (56 y.o. 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 05/22/2022 Medical Record Number: [id number] Patient Account Number: [id number] Date of Birth/Sex: Treating RN: 1966-02-23 (56 y.o. 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, unspecified Z21 Asymptomatic human immunodeficiency virus [HIV] infection status K26.0 Acute duodenal ulcer with hemorrhage Facility  Procedures CPT4 Code: [id number] Description: G0277-(Facility Use Only) HBOT full body chamber, , ICD-10 Diagnosis Description M86.68 Other chronic osteomyelitis, other site Z87.828 Personal history of other (healed) physical injury and trauma 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,  unspecified Z21 Asymptomatic human immunodeficiency virus [HIV] infection status Modifier: Quantity: 4 Physician Procedures : CPT4 Code Description Modifier [id number] 93810 - WC PHYS HYPERBARIC OXYGEN THERAPY ICD-10 Diagnosis Description M86.68 Other chronic osteomyelitis, other site Z87.828 Personal history of other (healed) physical injury and trauma M26.9 Dentofacial  anomaly, unspecified Z21 Asymptomatic human immunodeficiency virus [HIV] infection status Quantity: 1 Electronic Signature(s) Signed: 05/22/2022 2:10:21 PM By: [name] EMT Signed: 05/22/2022 2:52:31 PM By: Geralyn Corwin DO Entered By: [name] on 05/22/2022 14:10:20

## 2022-05-25 ENCOUNTER — Encounter (HOSPITAL_BASED_OUTPATIENT_CLINIC_OR_DEPARTMENT_OTHER): Payer: BC Managed Care – PPO | Admitting: Internal Medicine

## 2022-05-25 DIAGNOSIS — M269 Dentofacial anomaly, unspecified: Secondary | ICD-10-CM

## 2022-05-25 DIAGNOSIS — Z21 Asymptomatic human immunodeficiency virus [HIV] infection status: Secondary | ICD-10-CM | POA: Diagnosis not present

## 2022-05-25 DIAGNOSIS — M8668 Other chronic osteomyelitis, other site: Secondary | ICD-10-CM | POA: Diagnosis not present

## 2022-05-25 DIAGNOSIS — Z87828 Personal history of other (healed) physical injury and trauma: Secondary | ICD-10-CM | POA: Diagnosis not present

## 2022-05-25 NOTE — Progress Notes (Addendum)
JARETH, PARDEE ([id number]) Visit Report for 05/25/2022 Arrival Information Details Patient Name: Date of Service: [name] 05/25/2022 10:00 A M Medical Record Number: [id number] Patient Account Number: [id number] Date of Birth/Sex: Treating RN: Dec 15, 1965 (56 y.o. [name] Primary Care Jannetta Massey: [name] Other Clinician: [name] Referring Margaux Engen: Treating Tamora Huneke/Extender: [name] in Treatment: 4 Visit Information History Since Last Visit All ordered tests and consults were completed: Yes Patient Arrived: Ambulatory Added or deleted any medications: No Arrival Time: 09:34 Any new allergies or adverse reactions: No Accompanied By: self Had a fall or experienced change in No Transfer Assistance: None activities of daily living that may affect Patient Identification Verified: Yes risk of falls: Secondary Verification Process Completed: Yes Signs or symptoms of abuse/neglect since last visito No Patient Requires Transmission-Based Precautions: No Hospitalized since last visit: No Patient Has Alerts: No Implantable device outside of the clinic excluding No cellular tissue based products placed in the center since last visit: Pain Present Now: No Electronic Signature(s) Signed: 05/25/2022 12:09:57 PM By: [name] CHT EMT BS , , Entered By: [name] on 05/25/2022 12:09:57 -------------------------------------------------------------------------------- Encounter Discharge Information Details Patient Name: Date of Service: [name] 05/25/2022 10:00 A M Medical Record Number: [id number] Patient Account Number: [id number] Date of Birth/Sex: Treating RN: 10-16-1966 (56 y.o. [name] Primary Care Xiana Carns: [name] Other Clinician: [name] Referring Mardell Suttles: Treating Jyren Cerasoli/Extender: [name] in Treatment: 4 Encounter  Discharge Information Items Discharge Condition: Stable Ambulatory Status: Ambulatory Discharge Destination: Home Transportation: Private Auto Accompanied By: Wife Schedule Follow-up Appointment: Yes Clinical Summary of Care: Electronic Signature(s) Signed: 05/25/2022 2:01:17 PM By: [name] EMT Entered By: [name] on 05/25/2022 14:01:16 -------------------------------------------------------------------------------- Vitals Details Patient Name: Date of Service: [name] 05/25/2022 10:00 A M Medical Record Number: [id number] Patient Account Number: [id number] Date of Birth/Sex: Treating RN: Oct 29, 1966 (56 y.o. [name] Primary Care Octavio Matheney: [name] Other Clinician: [name] Referring Abrham Maslowski: Treating Brysun Eschmann/Extender: [name] in Treatment: 4 Vital Signs Time Taken: 09:49 Temperature (F): 98.2 Height (in): 71 Pulse (bpm): 78 Weight (lbs): 190 Respiratory Rate (breaths/min): 18 Body Mass Index (BMI): 26.5 Blood Pressure (mmHg): 108/65 Reference Range: 80 - 120 mg / dl Electronic Signature(s) Signed: 05/25/2022 12:10:22 PM By: [name] CHT EMT BS , , Entered By: [name] on 05/25/2022 12:10:21

## 2022-05-25 NOTE — Progress Notes (Signed)
Jimmy [name], Jimmy [name] ([id number]) Visit Report for 05/25/2022 Problem List Details Patient Name: Date of Service: [name] 05/25/2022 10:00 A M Medical Record Number: [id number] Patient Account Number: [id number] Date of Birth/Sex: Treating RN: 1966/03/17 (56 y.o. 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 05/25/2022 Medical Record Number: [id number] Patient Account Number: [id number] Date of Birth/Sex: Treating RN: 1966/11/11 (56 y.o. 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, unspecified Z21 Asymptomatic human immunodeficiency virus [HIV] infection status K26.0 Acute duodenal ulcer with hemorrhage Facility  Procedures CPT4 Code: [id number] Description: G0277-(Facility Use Only) HBOT full body chamber, , ICD-10 Diagnosis Description M86.68 Other chronic osteomyelitis, other site Z87.828 Personal history of other (healed) physical injury and trauma 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,  unspecified Z21 Asymptomatic human immunodeficiency virus [HIV] infection status Modifier: Quantity: 4 Physician Procedures : CPT4 Code Description Modifier [id number] - WC PHYS HYPERBARIC OXYGEN THERAPY ICD-10 Diagnosis Description M86.68 Other chronic osteomyelitis, other site Z87.828 Personal history of other (healed) physical injury and trauma M26.9 Dentofacial  anomaly, unspecified Z21 Asymptomatic human immunodeficiency virus [HIV] infection status Quantity: 1 Electronic Signature(s) Signed: 05/25/2022 2:00:39 PM By: [name] EMT Signed: 05/25/2022 4:25:55 PM By: Geralyn Corwin DO Entered By: [name] on 05/25/2022 14:00:39

## 2022-05-25 NOTE — Progress Notes (Addendum)
Jimmy [name], Jimmy [name] ([id number]) Visit Report for 05/25/2022 HBO Details Patient Name: Date of Service: Jimmy [name] 05/25/2022 10:00 A M Medical Record Number: [id number] Patient Account Number: [id number] Date of Birth/Sex: Treating RN: 03-19-1966 (56 y.o. 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 HBO Treatment Details Treatment Number: 14 Patient Type: Outpatient Chamber Type: Monoplace Chamber Serial #: [id number] Treatment Protocol: 2.0 ATA with 90 minutes oxygen, with two 5 minute air breaks Treatment Details Compression Rate Down: 2.0 psi / minute De-Compression Rate Up: 2.0 psi / minute A breaks and breathing ir Compress Tx Pressure periods Decompress Decompress Begins Reached (leave unused spaces Begins Ends blank) Chamber Pressure (ATA 1 2 2 2 2 2  --2 1 ) Clock Time (24 hr) 09:52 10:00 10:30 10:35 11:05 11:10 - - 11:40 11:49 Treatment Length: 117 (minutes) Treatment Segments: 4 Vital Signs Capillary Blood Glucose Reference Range: 80 - 120 mg / dl HBO Diabetic Blood Glucose Intervention Range: <131 mg/dl or mg/dl Time Vitals Blood Respiratory Capillary Blood Glucose Pulse Action Type: Pulse: Temperature: Taken: Pressure: Rate: Glucose (mg/dl): Meter #: Oximetry (%) Taken: Pre 09:49 108/65 78 18 98.2 Post 11:51 106/65 74 16 97.1 Treatment Response Treatment Toleration: Well Treatment Completion Status: Treatment Completed without Adverse Event Physician HBO Attestation: I certify that I supervised this HBO treatment in accordance with Medicare guidelines. A trained emergency response  team is readily available per Yes hospital policies and procedures. Continue HBOT as ordered. Yes Electronic Signature(s) Signed: 05/25/2022 4:25:55 PM By: 07/26/2022 DO Previous Signature: 05/25/2022 2:00:16 PM Version By: 07/26/2022 EMT Entered By: [name] on 05/25/2022 16:23:04 -------------------------------------------------------------------------------- HBO Safety Checklist Details Patient Name: Date of Service: 07/26/2022 [name] 05/25/2022 10:00 A M Medical Record Number: 07/26/2022 Patient Account Number: [id number] Date of Birth/Sex: Treating RN: 01/25/1966 (56 y.o. 54 Primary Care Hence Derrick: Jimmy [name] Other Clinician: [name] Referring Zamaya Rapaport: Treating Farren Nelles/Extender: [name] in Treatment: 4 HBO Safety Checklist Items Safety Checklist Consent Form Signed Patient voided / foley secured and emptied When did you last eato 0630 Last dose of injectable or oral agent n/a Ostomy pouch emptied and vented if applicable NA All implantable devices assessed, documented and approved NA Intravenous access site secured and place NA Valuables secured Linens and cotton and cotton/polyester blend (less than 51% polyester) Personal oil-based products / skin lotions / body lotions removed Wigs or hairpieces removed NA Smoking or tobacco materials removed Books / newspapers / magazines / loose paper removed Cologne, aftershave, perfume and deodorant removed Jewelry removed (may wrap wedding band) Make-up removed NA Hair care products removed Battery operated devices (external) removed Heating patches and chemical warmers removed Titanium eyewear removed NA Nail polish cured greater than 10 hours NA Casting material cured greater than 10 hours NA Hearing aids removed NA Loose dentures or partials removed NA Prosthetics have been removed NA Patient demonstrates correct use of air break  device (if applicable) Patient concerns have been addressed Patient grounding bracelet on and cord attached to chamber Specifics for Inpatients (complete in addition to above) Medication sheet sent with patient NA Intravenous medications needed or due during therapy sent with patient NA Drainage tubes (e.g. nasogastric tube or  chest tube secured and vented) NA Endotracheal or Tracheotomy tube secured NA Cuff deflated of air and inflated with saline NA Airway suctioned NA Notes Paper version used prior to treatment start. Electronic Signature(s) Signed: 05/25/2022 12:11:26 PM By: [name] CHT EMT BS , , Entered By: [name] on 05/25/2022 12:11:26

## 2022-05-26 ENCOUNTER — Encounter (HOSPITAL_BASED_OUTPATIENT_CLINIC_OR_DEPARTMENT_OTHER): Payer: BC Managed Care – PPO | Admitting: Internal Medicine

## 2022-05-26 DIAGNOSIS — Z21 Asymptomatic human immunodeficiency virus [HIV] infection status: Secondary | ICD-10-CM | POA: Diagnosis not present

## 2022-05-26 DIAGNOSIS — M8668 Other chronic osteomyelitis, other site: Secondary | ICD-10-CM | POA: Diagnosis not present

## 2022-05-26 DIAGNOSIS — Z87828 Personal history of other (healed) physical injury and trauma: Secondary | ICD-10-CM | POA: Diagnosis not present

## 2022-05-26 DIAGNOSIS — M269 Dentofacial anomaly, unspecified: Secondary | ICD-10-CM

## 2022-05-26 NOTE — Progress Notes (Addendum)
RAD, GRAMLING ([id number]) Visit Report for 05/26/2022 Arrival Information Details Patient Name: Date of Service: [name] 05/26/2022 10:00 A M Medical Record Number: [id number] Patient Account Number: [id number] Date of Birth/Sex: Treating RN: [birth year] (56 y.o. [name], [name] Primary Care Bao Coreas: [name] Other Clinician: [name] Referring Ellamay Fors: Treating Wonder Donaway/Extender: [name] in Treatment: 4 Visit Information History Since Last Visit All ordered tests and consults were completed: Yes Patient Arrived: Ambulatory Added or deleted any medications: Yes Arrival Time: 09:35 Any new allergies or adverse reactions: No Accompanied By: self Had a fall or experienced change in No Transfer Assistance: None activities of daily living that may affect Patient Identification Verified: Yes risk of falls: Secondary Verification Process Completed: Yes Signs or symptoms of abuse/neglect since last visito No Patient Requires Transmission-Based Precautions: No Hospitalized since last visit: No Patient Has Alerts: No Implantable device outside of the clinic excluding No cellular tissue based products placed in the center since last visit: Pain Present Now: No Electronic Signature(s) Signed: 05/26/2022 11:22:15 AM By: [name] CHT EMT BS , , Previous Signature: 05/26/2022 11:16:19 AM Version By: [name] CHT EMT BS , , Entered By: [name] on 05/26/2022 11:22:15 -------------------------------------------------------------------------------- Encounter Discharge Information Details Patient Name: Date of Service: [name], [name] 05/26/2022 10:00 A M Medical Record Number: [id number] Patient Account Number: [id number] Date of Birth/Sex: Treating RN: 12-03-1965 (56 y.o. [name] Primary Care Ellasyn Swilling: [name] Other Clinician: [name] Referring Aleena Kirkeby: Treating  Devlynn Knoff/Extender: [name] in Treatment: 4 Encounter Discharge Information Items Discharge Condition: Stable Ambulatory Status: Ambulatory Discharge Destination: Home Transportation: Private Auto Accompanied By: self Schedule Follow-up Appointment: No Clinical Summary of Care: Electronic Signature(s) Signed: 05/26/2022 12:41:43 PM By: [name] CHT EMT BS , , Entered By: [name] on 05/26/2022 12:41:43 -------------------------------------------------------------------------------- Vitals Details Patient Name: Date of Service: [name] 05/26/2022 10:00 A M Medical Record Number: [id number] Patient Account Number: [id number] Date of Birth/Sex: Treating RN: Nov 10, 1966 (56 y.o. [name] Primary Care Abdulla Pooley: [name] Other Clinician: [name] Referring Adelynn Gipe: Treating Chelsa Stout/Extender: [name] in Treatment: 4 Vital Signs Time Taken: 09:55 Temperature (F): 97.3 Height (in): 71 Pulse (bpm): 79 Weight (lbs): 190 Respiratory Rate (breaths/min): 16 Body Mass Index (BMI): 26.5 Blood Pressure (mmHg): 125/67 Reference Range: 80 - 120 mg / dl Electronic Signature(s) Signed: 05/26/2022 11:17:16 AM By: [name] CHT EMT BS , , Entered By: [name] on 05/26/2022 11:17:16

## 2022-05-26 NOTE — Progress Notes (Signed)
Jimmy [name], Jimmy [name] ([id number]) Visit Report for 05/26/2022 HBO Safety Checklist Details Patient Name: Date of Service: [name] 05/26/2022 10:00 A M Medical Record Number: [id number] Patient Account Number: [id number] Date of Birth/Sex: Treating RN: June 29, 1966 (56 y.o. [name] Primary Care Parneet Glantz: [name] Other Clinician: [name] Referring Aaryan Essman: Treating Marwah Disbro/Extender: [name] in Treatment: 4 HBO Safety Checklist Items Safety Checklist Consent Form Signed Patient voided / foley secured and emptied When did you last eato 0630 Last dose of injectable or oral agent n/a Ostomy pouch emptied and vented if applicable NA All implantable devices assessed, documented and approved NA Intravenous access site secured and place NA Valuables secured Linens and cotton and cotton/polyester blend (less than 51% polyester) Personal oil-based products / skin lotions / body lotions removed NA Wigs or hairpieces removed NA Smoking or tobacco materials removed Books / newspapers / magazines / loose paper removed Cologne, aftershave, perfume and deodorant removed Jewelry removed (may wrap wedding band) Make-up removed NA Hair care products removed Battery operated devices (external) removed Heating patches and chemical warmers removed Titanium eyewear removed NA Nail polish cured greater than 10 hours NA Casting material cured greater than 10 hours NA Hearing aids removed NA Loose dentures or partials removed NA Prosthetics have been removed NA Patient demonstrates correct use of air break device (if applicable) Patient concerns have been addressed Patient grounding bracelet on and cord attached to chamber Specifics for Inpatients (complete in addition to above) Medication sheet sent with patient NA Intravenous medications needed or due during therapy sent with patient NA Drainage tubes (e.g. nasogastric  tube or chest tube secured and vented) NA Endotracheal or Tracheotomy tube secured NA Cuff deflated of air and inflated with saline NA Airway suctioned NA Notes Paper version used prior to treatment. Electronic Signature(s) Signed: 05/26/2022 11:18:21 AM By: [name] CHT EMT BS , , Entered By: [name] on 05/26/2022 11:18:21

## 2022-05-27 ENCOUNTER — Encounter (HOSPITAL_BASED_OUTPATIENT_CLINIC_OR_DEPARTMENT_OTHER): Payer: BC Managed Care – PPO | Admitting: General Surgery

## 2022-05-27 DIAGNOSIS — M8668 Other chronic osteomyelitis, other site: Secondary | ICD-10-CM | POA: Diagnosis not present

## 2022-05-27 DIAGNOSIS — Z87828 Personal history of other (healed) physical injury and trauma: Secondary | ICD-10-CM | POA: Diagnosis not present

## 2022-05-27 DIAGNOSIS — Z21 Asymptomatic human immunodeficiency virus [HIV] infection status: Secondary | ICD-10-CM | POA: Diagnosis not present

## 2022-05-27 DIAGNOSIS — M269 Dentofacial anomaly, unspecified: Secondary | ICD-10-CM | POA: Diagnosis not present

## 2022-05-27 NOTE — Progress Notes (Signed)
ESMERALDA, BLANFORD ([id number]) Visit Report for 05/26/2022 SuperBill Details Patient Name: Date of Service: [name] 05/26/2022 Medical Record Number: [id number] Patient Account Number: [id number] Date of Birth/Sex: Treating RN: July 12, 1966 (56 y.o. 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, unspecified Z21 Asymptomatic human immunodeficiency virus [HIV] infection status K26.0 Acute duodenal ulcer with hemorrhage Facility Procedures CPT4 Code Description Modifier Quantity [id number] G0277-(Facility Use Only) HBOT full body chamber, , 4 ICD-10 Diagnosis Description M86.68 Other chronic osteomyelitis, other site Z87.828 Personal history of other (healed) physical injury and trauma 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, unspecified Z21 Asymptomatic human immunodeficiency virus [HIV] infection status Physician Procedures Quantity CPT4 Code Description Modifier [id number] - WC PHYS HYPERBARIC OXYGEN THERAPY 1 ICD-10 Diagnosis Description M86.68 Other chronic osteomyelitis, other site Z87.828 Personal history of other (healed) physical injury and trauma 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

## 2022-05-27 NOTE — Progress Notes (Signed)
CARLESTER, KASPAREK ([id number]) Visit Report for 05/27/2022 Arrival Information Details Patient Name: Date of Service: Jimmy [name]. 05/27/2022 10:00 A M Medical Record Number: [id number] Patient Account Number: [id number] Date of Birth/Sex: Treating RN: June 27, 1966 (56 y.o. [name] Primary Care Ronnita Paz: [name] Other Clinician: [name] Referring Savyon Loken: Treating Jayd Cadieux/Extender: [name] in Treatment: 4 Visit Information History Since Last Visit All ordered tests and consults were completed: Yes Patient Arrived: Ambulatory Added or deleted any medications: No Arrival Time: 09:17 Any new allergies or adverse reactions: No Accompanied By: spouse Had a fall or experienced change in No Transfer Assistance: None activities of daily living that may affect Patient Identification Verified: Yes risk of falls: Secondary Verification Process Completed: Yes Signs or symptoms of abuse/neglect since last visito No Patient Requires Transmission-Based Precautions: No Hospitalized since last visit: No Patient Has Alerts: No Implantable device outside of the clinic excluding No cellular tissue based products placed in the center since last visit: Pain Present Now: No Electronic Signature(s) Signed: 05/27/2022 11:43:26 AM By: [name] CHT EMT BS , , Entered By: [name] on 05/27/2022 11:43:26 -------------------------------------------------------------------------------- Encounter Discharge Information Details Patient Name: Date of Service: Jimmy Sauers, [name] 05/27/2022 10:00 A M Medical Record Number: [id number] Patient Account Number: [id number] Date of Birth/Sex: Treating RN: [birth year] (56 y.o. [name] Primary Care Ruthie Berch: [name] Other Clinician: [name] Referring Zara Wendt: Treating Arrin Pintor/Extender: [name] in Treatment: 4 Encounter  Discharge Information Items Discharge Condition: Stable Ambulatory Status: Ambulatory Discharge Destination: Home Transportation: Private Auto Accompanied By: spouse Schedule Follow-up Appointment: No Clinical Summary of Care: Electronic Signature(s) Signed: 05/27/2022 11:46:57 AM By: [name] CHT EMT BS , , Entered By: [name] on 05/27/2022 11:46:57 -------------------------------------------------------------------------------- Vitals Details Patient Name: Date of Service: Jimmy Nian [name] 05/27/2022 10:00 A M Medical Record Number: [id number] Patient Account Number: [id number] Date of Birth/Sex: Treating RN: Oct 27, 1966 (56 y.o. [name] Primary Care Charl Wellen: [name] Other Clinician: [name] Referring Briyah Wheelwright: Treating Nixie Laube/Extender: [name] in Treatment: 4 Vital Signs Time Taken: 09:25 Temperature (F): 97.4 Height (in): 71 Pulse (bpm): 86 Weight (lbs): 190 Respiratory Rate (breaths/min): 16 Body Mass Index (BMI): 26.5 Blood Pressure (mmHg): 125/69 Reference Range: 80 - 120 mg / dl Electronic Signature(s) Signed: 05/27/2022 11:43:59 AM By: [name] CHT EMT BS , , Entered By: [name] on 05/27/2022 11:43:58

## 2022-05-27 NOTE — Progress Notes (Addendum)
Jimmy [name] ([id number]) Visit Report for 05/27/2022 HBO Details Patient Name: Date of Service: [name] 05/27/2022 10:00 A M Medical Record Number: [id number] Patient Account Number: [id number] Date of Birth/Sex: Treating RN: 09-19-1966 (56 y.o. 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 HBO Treatment Details Treatment Number: 16 Patient Type: Outpatient Chamber Type: Monoplace Chamber Serial #: [id number] Treatment Protocol: 2.0 ATA with 90 minutes oxygen, with two 5 minute air breaks Treatment Details Compression Rate Down: 2.0 psi / minute De-Compression Rate Up: 2.0 psi / minute A breaks and breathing ir Compress Tx Pressure periods Decompress Decompress Begins Reached (leave unused spaces Begins Ends blank) Chamber Pressure (ATA 1 2 2 2 2 2  --2 1 ) Clock Time (24 hr) 09:25 09:33 10:03 10:08 10:38 10:43 - - 11:13 11:19 Treatment Length: 114 (minutes) Treatment Segments: 4 Vital Signs Capillary Blood Glucose Reference Range: 80 - 120 mg / dl HBO Diabetic Blood Glucose Intervention Range: <131 mg/dl or mg/dl Type: Time Vitals Blood Respiratory Capillary Blood Glucose Pulse Action Pulse: Temperature: Taken: Pressure: Rate: Glucose (mg/dl): Meter #: Oximetry (%) Taken: Pre 09:25 125/69 86 16 97.4 none per protocol Post 11:23 123/73 73 16 98.1 none per protocol Treatment Response Treatment Toleration: Well Treatment Completion Status: Treatment Completed without Adverse Event Physician HBO Attestation: I certify that I supervised this HBO treatment in accordance with  Medicare guidelines. A trained emergency response team is readily available per Yes hospital policies and procedures. Continue HBOT as ordered. Yes Electronic Signature(s) Signed: 05/27/2022 12:29:07 PM By: 07/28/2022 MD FACS Previous Signature: 05/27/2022 11:46:04 AM Version By: 07/28/2022 CHT EMT BS , , Entered By: [name] on 05/27/2022 12:29:06 -------------------------------------------------------------------------------- HBO Safety Checklist Details Patient Name: Date of Service: 07/28/2022, Jimmy [name] [name] 05/27/2022 10:00 A M Medical Record Number: 07/28/2022 Patient Account Number: [id number] Date of Birth/Sex: Treating RN: Feb 27, 1966 (56 y.o. 54 Primary Care Kaedence Connelly: Jimmy [name] Other Clinician: [name] Referring Prince Olivier: Treating Nacole Fluhr/Extender: [name] in Treatment: 4 HBO Safety Checklist Items Safety Checklist Consent Form Signed Patient voided / foley secured and emptied When did you last eato 0630 Last dose of injectable or oral agent N/A Ostomy pouch emptied and vented if applicable NA All implantable devices assessed, documented and approved NA Intravenous access site secured and place NA Valuables secured Linens and cotton and cotton/polyester blend (less than 51% polyester) Personal oil-based products / skin lotions / body lotions removed Wigs or hairpieces removed NA Smoking or tobacco materials removed Books / newspapers / magazines / loose paper removed Cologne, aftershave, perfume and deodorant removed Jewelry removed (may wrap wedding band) Make-up removed NA Hair care products removed Battery operated devices (external) removed Heating patches and chemical warmers removed Titanium eyewear removed NA Nail polish cured greater than 10 hours NA Casting material cured greater than 10 hours NA Hearing aids removed NA Loose dentures or partials removed NA Prosthetics  have been removed NA Patient demonstrates correct use of air break device (if applicable) Patient concerns have been addressed Patient grounding bracelet on and cord attached to chamber Specifics for Inpatients (complete in addition to above) Medication sheet sent with patient NA Intravenous medications needed or due during  therapy sent with patient NA Drainage tubes (e.g. nasogastric tube or chest tube secured and vented) NA Endotracheal or Tracheotomy tube secured NA Cuff deflated of air and inflated with saline NA Airway suctioned NA Notes Paper version used prior to treatment. Electronic Signature(s) Signed: 05/27/2022 11:45:04 AM By: [name] CHT EMT BS , , Entered By: [name] on 05/27/2022 11:45:04

## 2022-05-27 NOTE — Progress Notes (Signed)
Jimmy [name], Jimmy [name] ([id number]) Visit Report for 05/27/2022 SuperBill Details Patient Name: Date of Service: [name] 05/27/2022 Medical Record Number: [id number] Patient Account Number: [id number] Date of Birth/Sex: Treating RN: [birth year] (56 y.o. 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, unspecified Z21 Asymptomatic human immunodeficiency virus [HIV] infection status K26.0 Acute duodenal ulcer with hemorrhage Facility Procedures CPT4 Code Description Modifier Quantity [id number] G0277-(Facility Use Only) HBOT full body chamber, , 4 ICD-10 Diagnosis Description M86.68 Other chronic osteomyelitis, other site Z87.828 Personal history of other (healed) physical injury and trauma 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, unspecified Z21 Asymptomatic human immunodeficiency virus [HIV] infection status Physician Procedures Quantity CPT4 Code Description Modifier [id number] - WC PHYS HYPERBARIC OXYGEN THERAPY 1 ICD-10 Diagnosis Description M86.68 Other chronic osteomyelitis, other site Z87.828 Personal history of other (healed) physical injury and trauma 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

## 2022-05-28 ENCOUNTER — Encounter (HOSPITAL_BASED_OUTPATIENT_CLINIC_OR_DEPARTMENT_OTHER): Payer: BC Managed Care – PPO | Admitting: General Surgery

## 2022-05-28 DIAGNOSIS — M8668 Other chronic osteomyelitis, other site: Secondary | ICD-10-CM | POA: Diagnosis not present

## 2022-05-28 DIAGNOSIS — Z21 Asymptomatic human immunodeficiency virus [HIV] infection status: Secondary | ICD-10-CM | POA: Diagnosis not present

## 2022-05-28 DIAGNOSIS — Z87828 Personal history of other (healed) physical injury and trauma: Secondary | ICD-10-CM | POA: Diagnosis not present

## 2022-05-28 DIAGNOSIS — M269 Dentofacial anomaly, unspecified: Secondary | ICD-10-CM | POA: Diagnosis not present

## 2022-05-28 NOTE — Progress Notes (Addendum)
Jimmy [name] ([id number]) Visit Report for 05/28/2022 Arrival Information Details Patient Name: Date of Service: Jimmy [name]. 05/28/2022 10:00 A M Medical Record Number: [id number] Patient Account Number: [id number] Date of Birth/Sex: Treating RN: 14-Jan-1966 (56 y.o. Jimmy [name] Primary Care Jimmy [name]: Jimmy [name] Other Clinician: Haywood [name] Referring Jimmy [name]: Treating Jimmy [name]/Extender: Jimmy [name] in Treatment: 4 Visit Information History Since Last Visit All ordered tests and consults were completed: Yes Patient Arrived: Ambulatory Added or deleted any medications: No Arrival Time: 09:17 Any new allergies or adverse reactions: No Accompanied By: self Had a fall or experienced change in No Transfer Assistance: None activities of daily living that may affect Patient Identification Verified: Yes risk of falls: Secondary Verification Process Completed: Yes Signs or symptoms of abuse/neglect since last visito No Patient Requires Transmission-Based Precautions: No Hospitalized since last visit: No Patient Has Alerts: No Implantable device outside of the clinic excluding No cellular tissue based products placed in the center since last visit: Pain Present Now: No Electronic Signature(s) Signed: 05/28/2022 11:07:32 AM By: Jimmy [name] CHT EMT BS , , Entered By: Jimmy [name] on 05/28/2022 11:07:31 -------------------------------------------------------------------------------- Encounter Discharge Information Details Patient Name: Date of Service: Jimmy [name], [name] Jimmy W. 05/28/2022 10:00 A M Medical Record Number: [id number] Patient Account Number: [id number] Date of Birth/Sex: Treating RN: [birth year] (56 y.o. Jimmy [name] Primary Care Jimmy [name]: Jimmy [name] Other Clinician: Haywood [name] Referring Anh [name]: Treating Kamayah Pillay/Extender: Jimmy [name] in Treatment: 4 Encounter  Discharge Information Items Discharge Condition: Stable Ambulatory Status: Ambulatory Discharge Destination: Home Transportation: Private Auto Accompanied By: spouse Schedule Follow-up Appointment: No Clinical Summary of Care: Electronic Signature(s) Signed: 05/28/2022 12:19:48 PM By: Jimmy [name] CHT EMT BS , , Entered By: Jimmy [name] on 05/28/2022 12:19:48 -------------------------------------------------------------------------------- Vitals Details Patient Name: Date of Service: Jimmy [name] Jimmy W. 05/28/2022 10:00 A M Medical Record Number: [id number] Patient Account Number: [id number] Date of Birth/Sex: Treating RN: 03-01-1966 (56 y.o. Jimmy [name] Primary Care Akeela Busk: Jimmy [name] Other Clinician: Haywood [name] Referring Jimmy [name]: Treating Jimmy [name]/Extender: Jimmy [name] in Treatment: 4 Vital Signs Time Taken: 09:24 Temperature (F): 98.1 Height (in): 71 Pulse (bpm): 75 Weight (lbs): 190 Respiratory Rate (breaths/min): 20 Body Mass Index (BMI): 26.5 Blood Pressure (mmHg): 119/72 Reference Range: 80 - 120 mg / dl Electronic Signature(s) Signed: 05/28/2022 11:07:59 AM By: Jimmy [name] CHT EMT BS , , Entered By: Jimmy [name] on 05/28/2022 11:07:59

## 2022-05-28 NOTE — Progress Notes (Signed)
Jimmy [name], Jimmy [name] ([id number]) Visit Report for 05/28/2022 SuperBill Details Patient Name: Date of Service: [name] 05/28/2022 Medical Record Number: [id number] Patient Account Number: [id number] Date of Birth/Sex: Treating RN: December 06, 1965 (56 y.o. 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, unspecified Z21 Asymptomatic human immunodeficiency virus [HIV] infection status K26.0 Acute duodenal ulcer with hemorrhage Facility Procedures CPT4 Code Description Modifier Quantity [id number]-(Facility Use Only) HBOT full body chamber, , 4 ICD-10 Diagnosis Description M86.68 Other chronic osteomyelitis, other site Z87.828 Personal history of other (healed) physical injury and trauma 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

## 2022-05-28 NOTE — Progress Notes (Addendum)
Jimmy [name], Jimmy [name] ([id number]) Visit Report for 05/28/2022 HBO Details Patient Name: Date of Service: [name] 05/28/2022 10:00 A M Medical Record Number: [id number] Patient Account Number: [id number] Date of Birth/Sex: Treating RN: Sep 18, 1966 (56 y.o. 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 HBO Treatment Details Treatment Number: 17 Patient Type: Outpatient Chamber Type: Monoplace Chamber Serial #: [id number] Treatment Protocol: 2.0 ATA with 90 minutes oxygen, with two 5 minute air breaks Treatment Details Compression Rate Down: 2.0 psi / minute De-Compression Rate Up: 2.0 psi / minute A breaks and breathing ir Compress Tx Pressure periods Decompress Decompress Begins Reached (leave unused spaces Begins Ends blank) Chamber Pressure (ATA 1 2 2 2 2 2  --2 1 ) Clock Time (24 hr) 09:26 09:35 10:05 10:10 10:41 10:46 - - 11:16 11:24 Treatment Length: 118 (minutes) Treatment Segments: 4 Vital Signs Capillary Blood Glucose Reference Range: 80 - 120 mg / dl HBO Diabetic Blood Glucose Intervention Range: <131 mg/dl or mg/dl Type: Time Vitals Blood Respiratory Capillary Blood Glucose Pulse Action Pulse: Temperature: Taken: Pressure: Rate: Glucose (mg/dl): Meter #: Oximetry (%) Taken: Pre 09:24 119/72 75 20 98.1 none per protocol Post 11:24 120/75 72 20 97.5 none per protocol Treatment Response Treatment Toleration: Well Treatment Completion Status: Treatment Completed without Adverse Event Physician HBO Attestation: I certify that I supervised this HBO treatment in accordance with  Medicare guidelines. A trained emergency response team is readily available per Yes hospital policies and procedures. Continue HBOT as ordered. Yes Electronic Signature(s) Signed: 05/28/2022 12:24:31 PM By: 05/30/2022 MD FACS Previous Signature: 05/28/2022 12:18:30 PM Version By: 05/30/2022 CHT EMT BS , , Previous Signature: 05/28/2022 11:09:50 AM Version By: 05/30/2022 CHT EMT BS , , Entered By: Jimmy [name] on 05/28/2022 12:24:31 -------------------------------------------------------------------------------- HBO Safety Checklist Details Patient Name: Date of Service: 05/30/2022 [name] 05/28/2022 10:00 A M Medical Record Number: 05/30/2022 Patient Account Number: [id number] Date of Birth/Sex: Treating RN: 1966/05/06 (56 y.o. 53 Primary Care Jimmy [name]: Jimmy [name] Other Clinician: Alinda [name] Referring Jimmy [name]: Treating Jimmy [name]/Extender: Jimmy [name] in Treatment: 4 HBO Safety Checklist Items Safety Checklist Consent Form Signed Patient voided / foley secured and emptied When did you last eato 0630 Last dose of injectable or oral agent n/a Ostomy pouch emptied and vented if applicable NA All implantable devices assessed, documented and approved NA Intravenous access site secured and place NA Valuables secured Linens and cotton and cotton/polyester blend (less than 51% polyester) Personal oil-based products / skin lotions / body lotions removed Wigs or hairpieces removed NA Smoking or tobacco materials removed Books / newspapers / magazines / loose paper removed Cologne, aftershave, perfume and deodorant removed Jewelry removed (may wrap wedding band) Make-up removed NA Hair care products removed Battery operated devices (external) removed Heating patches and chemical warmers removed Titanium eyewear removed NA Nail polish cured greater than 10 hours NA Casting material cured greater than  10 hours NA Hearing aids removed NA Loose dentures or partials removed NA Prosthetics have been removed NA Patient demonstrates correct use of air break device (if applicable) Patient concerns have been addressed Patient grounding bracelet on and cord attached to chamber Specifics for Inpatients (complete in addition  to above) Medication sheet sent with patient NA Intravenous medications needed or due during therapy sent with patient NA Drainage tubes (e.g. nasogastric tube or chest tube secured and vented) NA Endotracheal or Tracheotomy tube secured NA Cuff deflated of air and inflated with saline NA Airway suctioned NA Notes Paper version used prior to treatment. Electronic Signature(s) Signed: 05/28/2022 11:09:02 AM By: Jimmy [name] CHT EMT BS , , Entered By: Jimmy [name] on 05/28/2022 11:09:02

## 2022-05-29 ENCOUNTER — Encounter (HOSPITAL_BASED_OUTPATIENT_CLINIC_OR_DEPARTMENT_OTHER): Payer: BC Managed Care – PPO | Admitting: Internal Medicine

## 2022-05-29 DIAGNOSIS — Z21 Asymptomatic human immunodeficiency virus [HIV] infection status: Secondary | ICD-10-CM | POA: Diagnosis not present

## 2022-05-29 DIAGNOSIS — M269 Dentofacial anomaly, unspecified: Secondary | ICD-10-CM | POA: Diagnosis not present

## 2022-05-29 DIAGNOSIS — M8668 Other chronic osteomyelitis, other site: Secondary | ICD-10-CM | POA: Diagnosis not present

## 2022-05-29 DIAGNOSIS — Z87828 Personal history of other (healed) physical injury and trauma: Secondary | ICD-10-CM | POA: Diagnosis not present

## 2022-05-29 NOTE — Progress Notes (Addendum)
KEYDEN, PAVLOV ([id number]) Visit Report for 05/29/2022 HBO Details Patient Name: Date of Service: [name] 05/29/2022 8:00 A M Medical Record Number: [id number] Patient Account Number: [id number] Date of Birth/Sex: Treating RN: 28-May-1966 (56 y.o. 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 HBO Treatment Details Treatment Number: 18 Patient Type: Outpatient Chamber Type: Monoplace Chamber Serial #: [id number] Treatment Protocol: 2.0 ATA with 90 minutes oxygen, with two 5 minute air breaks Treatment Details Compression Rate Down: 2.0 psi / minute De-Compression Rate Up: 2.0 psi / minute A breaks and breathing ir Compress Tx Pressure periods Decompress Decompress Begins Reached (leave unused spaces Begins Ends blank) Chamber Pressure (ATA 1 2 2 2 2 2  --2 1 ) Clock Time (24 hr) 08:08 08:17 08:47 08:52 09:22 09:27 - - 09:58 10:05 Treatment Length: 117 (minutes) Treatment Segments: 4 Vital Signs Capillary Blood Glucose Reference Range: 80 - 120 mg / dl HBO Diabetic Blood Glucose Intervention Range: <131 mg/dl or mg/dl Time Vitals Blood Respiratory Capillary Blood Glucose Pulse Action Type: Pulse: Temperature: Taken: Pressure: Rate: Glucose (mg/dl): Meter #: Oximetry (%) Taken: Pre 08:06 110/68 84 16 98 Post 10:09 118/73 72 18 98 Treatment Response Treatment Toleration: Well Treatment Completion Status: Treatment Completed without Adverse Event Ruthie Berch Notes No concerns with treatment given Physician HBO Attestation: I certify that I supervised this HBO treatment in accordance with  Medicare guidelines. A trained emergency response team is readily available per Yes hospital policies and procedures. Continue HBOT as ordered. Yes Electronic Signature(s) Signed: 05/29/2022 5:06:17 PM By: 05/31/2022 MD Previous Signature: 05/29/2022 11:12:39 AM Version By: 05/31/2022 EMT Previous Signature: 05/29/2022 8:24:50 AM Version By: 05/31/2022 EMT Entered By: [name] on 05/29/2022 17:05:52 -------------------------------------------------------------------------------- HBO Safety Checklist Details Patient Name: Date of Service: 05/31/2022, Jimmy [name] [name] 05/29/2022 8:00 A M Medical Record Number: 05/31/2022 Patient Account Number: [id number] Date of Birth/Sex: Treating RN: Mar 12, 1966 (56 y.o. 54 Primary Care Catori Panozzo: Jimmy [name] Other Clinician: [name] Referring Bee Hammerschmidt: Treating Yosiel Thieme/Extender: [name] in Treatment: 5 HBO Safety Checklist Items Safety Checklist Consent Form Signed Patient voided / foley secured and emptied When did you last eato 0600 Last dose of injectable or oral agent NA Ostomy pouch emptied and vented if applicable NA All implantable devices assessed, documented and approved NA Intravenous access site secured and place NA Valuables secured Linens and cotton and cotton/polyester blend (less than 51% polyester) Personal oil-based products / skin lotions / body lotions removed Wigs or hairpieces removed NA Smoking or tobacco materials removed Books / newspapers / magazines / loose paper removed Cologne, aftershave, perfume and deodorant removed Jewelry removed (may wrap wedding band) NA Make-up removed NA Hair care products removed Battery operated devices (external) removed Heating patches and chemical warmers removed Titanium eyewear removed NA Nail polish cured greater than 10 hours NA Casting material cured greater than 10 hours NA Hearing aids  removed NA Loose dentures or partials removed NA Prosthetics have been removed NA Patient demonstrates correct use of air break device (if applicable) Patient concerns have been addressed Patient grounding bracelet on and cord attached to chamber Specifics for Inpatients (complete in addition to above) Medication sheet sent with patient  NA Intravenous medications needed or due during therapy sent with patient NA Drainage tubes (e.g. nasogastric tube or chest tube secured and vented) NA Endotracheal or Tracheotomy tube secured NA Cuff deflated of air and inflated with saline NA Airway suctioned NA Notes The safety checklist was done before the patient treatment was started. Electronic Signature(s) Signed: 05/29/2022 8:24:12 AM By: [name] EMT Entered By: [name] on 05/29/2022 08:24:12

## 2022-05-29 NOTE — Progress Notes (Addendum)
GEOVANNI, RAHMING ([id number]) Visit Report for 05/29/2022 Arrival Information Details Patient Name: Date of Service: [name] 05/29/2022 8:00 A M Medical Record Number: [id number] Patient Account Number: [id number] Date of Birth/Sex: Treating RN: 1966/07/30 (56 y.o. [name] Primary Care Midge Momon: [name] Other Clinician: [name] Referring Marcee Jacobs: Treating Osvaldo Lamping/Extender: [name] in Treatment: 5 Visit Information History Since Last Visit All ordered tests and consults were completed: Yes Patient Arrived: Ambulatory Added or deleted any medications: No Arrival Time: 08:02 Any new allergies or adverse reactions: No Accompanied By: Wife Had a fall or experienced change in No Transfer Assistance: None activities of daily living that may affect Patient Identification Verified: Yes risk of falls: Secondary Verification Process Completed: Yes Signs or symptoms of abuse/neglect since last visito No Patient Requires Transmission-Based Precautions: No Hospitalized since last visit: No Patient Has Alerts: No Implantable device outside of the clinic excluding No cellular tissue based products placed in the center since last visit: Pain Present Now: No Electronic Signature(s) Signed: 05/29/2022 8:22:38 AM By: [name] EMT Entered By: [name] on 05/29/2022 08:22:38 -------------------------------------------------------------------------------- Encounter Discharge Information Details Patient Name: Date of Service: DA Council Mechanic, [name] 05/29/2022 8:00 A M Medical Record Number: [id number] Patient Account Number: [id number] Date of Birth/Sex: Treating RN: 1966-02-21 (56 y.o. [name] Primary Care Alishea Beaudin: [name] Other Clinician: [name] Referring Sanjuan Sawa: Treating Joyanna Kleman/Extender: [name] in Treatment: 5 Encounter Discharge Information  Items Discharge Condition: Stable Ambulatory Status: Ambulatory Discharge Destination: Home Transportation: Private Auto Accompanied By: Wife Schedule Follow-up Appointment: Yes Clinical Summary of Care: Electronic Signature(s) Signed: 05/29/2022 11:13:53 AM By: [name] EMT Entered By: [name] on 05/29/2022 11:13:53 -------------------------------------------------------------------------------- Vitals Details Patient Name: Date of Service: DA Council Mechanic, [name] 05/29/2022 8:00 A M Medical Record Number: [id number] Patient Account Number: [id number] Date of Birth/Sex: Treating RN: 11-24-1965 (56 y.o. [name] Primary Care Jianni Shelden: [name] Other Clinician: [name] Referring Meloney Feld: Treating Jaiveer Panas/Extender: [name] in Treatment: 5 Vital Signs Time Taken: 08:06 Temperature (F): 98.0 Height (in): 71 Pulse (bpm): 84 Weight (lbs): 190 Respiratory Rate (breaths/min): 16 Body Mass Index (BMI): 26.5 Blood Pressure (mmHg): 110/68 Reference Range: 80 - 120 mg / dl Electronic Signature(s) Signed: 05/29/2022 8:23:03 AM By: [name] EMT Entered By: [name] on 05/29/2022 08:23:03

## 2022-06-01 ENCOUNTER — Encounter (HOSPITAL_BASED_OUTPATIENT_CLINIC_OR_DEPARTMENT_OTHER): Payer: BC Managed Care – PPO | Admitting: Internal Medicine

## 2022-06-01 ENCOUNTER — Encounter (HOSPITAL_BASED_OUTPATIENT_CLINIC_OR_DEPARTMENT_OTHER): Payer: BC Managed Care – PPO | Admitting: General Surgery

## 2022-06-01 DIAGNOSIS — M269 Dentofacial anomaly, unspecified: Secondary | ICD-10-CM | POA: Diagnosis not present

## 2022-06-01 DIAGNOSIS — Z87828 Personal history of other (healed) physical injury and trauma: Secondary | ICD-10-CM | POA: Diagnosis not present

## 2022-06-01 DIAGNOSIS — M8668 Other chronic osteomyelitis, other site: Secondary | ICD-10-CM

## 2022-06-01 DIAGNOSIS — Z21 Asymptomatic human immunodeficiency virus [HIV] infection status: Secondary | ICD-10-CM | POA: Diagnosis not present

## 2022-06-01 NOTE — Progress Notes (Addendum)
MEIKO, STRANAHAN ([id number]) Visit Report for 06/01/2022 Arrival Information Details Patient Name: Date of Service: Jimmy [name]. 06/01/2022 9:30 A M Medical Record Number: [id number] Patient Account Number: [id number] Date of Birth/Sex: Treating RN: 08-01-1966 (56 y.o. [name] Primary Care Demetrius Barrell: [name] Other Clinician: [name] Referring Oluwafemi Villella: Treating Brockton Mckesson/Extender: [name] in Treatment: 5 Visit Information History Since Last Visit All ordered tests and consults were completed: Yes Patient Arrived: Ambulatory Added or deleted any medications: No Arrival Time: 09:15 Any new allergies or adverse reactions: No Accompanied By: spouse Had a fall or experienced change in No Transfer Assistance: None activities of daily living that may affect Patient Identification Verified: Yes risk of falls: Secondary Verification Process Completed: Yes Signs or symptoms of abuse/neglect since last visito No Patient Requires Transmission-Based Precautions: No Hospitalized since last visit: No Patient Has Alerts: No Implantable device outside of the clinic excluding No cellular tissue based products placed in the center since last visit: Pain Present Now: No Electronic Signature(s) Signed: 06/01/2022 11:18:29 AM By: [name] CHT EMT BS , , Entered By: [name] on 06/01/2022 10:44:04 -------------------------------------------------------------------------------- Clinic Level of Care Assessment Details Patient Name: Date of Service: Jimmy [name]. 06/01/2022 9:30 A M Medical Record Number: [id number] Patient Account Number: [id number] Date of Birth/Sex: Treating RN: [birth year] (56 y.o. Jimmy [name], [name] Primary Care Edi Gorniak: [name] Other Clinician: Referring Makaylen Thieme: Treating Curtisha Bendix/Extender: [name] in Treatment: 5 Clinic Level of Care Assessment  Items TOOL 4 Quantity Score X- 1 0 Use when only an EandM is performed on FOLLOW-UP visit ASSESSMENTS - Nursing Assessment / Reassessment X- 1 10 Reassessment of Co-morbidities (includes updates in patient status) X- 1 5 Reassessment of Adherence to Treatment Plan ASSESSMENTS - Wound and Skin A ssessment / Reassessment []  - 0 Simple Wound Assessment / Reassessment - one wound []  - 0 Complex Wound Assessment / Reassessment - multiple wounds []  - 0 Dermatologic / Skin Assessment (not related to wound area) ASSESSMENTS - Focused Assessment []  - 0 Circumferential Edema Measurements - multi extremities []  - 0 Nutritional Assessment / Counseling / Intervention []  - 0 Lower Extremity Assessment (monofilament, tuning fork, pulses) []  - 0 Peripheral Arterial Disease Assessment (using hand held doppler) ASSESSMENTS - Ostomy and/or Continence Assessment and Care []  - 0 Incontinence Assessment and Management []  - 0 Ostomy Care Assessment and Management (repouching, etc.) PROCESS - Coordination of Care X - Simple Patient / Family Education for ongoing care 1 15 []  - 0 Complex (extensive) Patient / Family Education for ongoing care X- 1 10 Staff obtains , Records, T Results / Process Orders est []  - 0 Staff telephones HHA, Nursing Homes / Clarify orders / etc []  - 0 Routine Transfer to another Facility (non-emergent condition) []  - 0 Routine Hospital Admission (non-emergent condition) []  - 0 New Admissions / / Ordering NPWT Apligraf, etc. , []  - 0 Emergency Hospital Admission (emergent condition) X- 1 10 Simple Discharge Coordination []  - 0 Complex (extensive) Discharge Coordination PROCESS - Special Needs []  - 0 Pediatric / Minor Patient Management []  - 0 Isolation Patient Management []  - 0 Hearing / Language / Visual special needs []  - 0 Assessment of Community assistance (transportation, D/C planning, etc.) []  - 0 Additional  assistance / Altered mentation []  - 0 Support Surface(s) Assessment (bed, cushion, seat, etc.) INTERVENTIONS - Wound Cleansing / Measurement []  - 0 Simple Wound Cleansing - one wound []  -  0 Complex Wound Cleansing - multiple wounds []  - 0 Wound Imaging (photographs - any number of wounds) []  - 0 Wound Tracing (instead of photographs) []  - 0 Simple Wound Measurement - one wound []  - 0 Complex Wound Measurement - multiple wounds INTERVENTIONS - Wound Dressings []  - 0 Small Wound Dressing one or multiple wounds []  - 0 Medium Wound Dressing one or multiple wounds []  - 0 Large Wound Dressing one or multiple wounds []  - 0 Application of Medications - topical []  - 0 Application of Medications - injection INTERVENTIONS - Miscellaneous []  - 0 External ear exam []  - 0 Specimen Collection (cultures, biopsies, blood, body fluids, etc.) []  - 0 Specimen(s) / Culture(s) sent or taken to Lab for analysis []  - 0 Patient Transfer (multiple staff / / Similar devices) []  - 0 Simple Staple / Suture removal (25 or less) []  - 0 Complex Staple / Suture removal (26 or more) []  - 0 Hypo / Hyperglycemic Management (close monitor of Blood Glucose) []  - 0 Ankle / Brachial Index (ABI) - do not check if billed separately X- 1 5 Vital Signs Has the patient been seen at the hospital within the last three years: Yes Total Score: 55 Level Of Care: New/Established - Level 2 Electronic Signature(s) Signed: 06/13/2022 12:37:29 PM By: RN, BSN Entered By: on 06/13/2022 10:19:13 -------------------------------------------------------------------------------- Encounter Discharge Information Details Patient Name: Date of Service: Jimmy , [name] 06/01/2022 9:30 A M Medical Record Number: Patient Account Number: Date of Birth/Sex: Treating RN: [birth year] (56 y.o. Primary Care Shekera Beavers: Nurse, adult Other  Clinician: Referring Gray Maugeri: Treating Geordie Nooney/Extender: in Treatment: 5 Encounter Discharge Information Items Discharge Condition: Stable Ambulatory Status: Ambulatory Discharge Destination: Home Transportation: Private Auto Accompanied By: self Schedule Follow-up Appointment: Yes Clinical Summary of Care: Patient Declined Electronic Signature(s) Signed: 06/01/2022 5:35:57 PM By: RN Entered By: on 06/01/2022 17:01:39 -------------------------------------------------------------------------------- Lower Extremity Assessment Details Patient Name: Date of Service: Shawn Stall [name] 06/01/2022 9:30 A M Medical Record Number: 06/15/2022 Patient Account Number: [employer] Date of Birth/Sex: Treating RN: 1966-08-25 (56 y.o. [id number] Primary Care Yeshua Stryker: [id number] Other Clinician: Referring Carlis Blanchard: Treating Breyanna Valera/Extender: 01/02/1966 in Treatment: 5 Electronic Signature(s) Signed: 06/01/2022 5:35:57 PM By: [name] RN Entered By: [name] on 06/01/2022 17:00:23 -------------------------------------------------------------------------------- Multi Wound Chart Details Patient Name: Date of Service: 06/03/2022, Karie Schwalbe [name] 06/01/2022 9:30 A M Medical Record Number: 06/03/2022 Patient Account Number: [name] Date of Birth/Sex: Treating RN: [birth year] (56 y.o. [id number] Primary Care Roberto Romanoski: 01/02/1966 Other Clinician: Referring Ipek Westra: Treating Lashe Oliveira/Extender: 54 in Treatment: 5 Wound Assessments Treatment Notes Electronic Signature(s) Signed: 06/01/2022 9:46:19 AM By: [name] MD FACS Signed: 06/01/2022 5:35:57 PM By: 06/03/2022 RN Entered By: Karie Schwalbe on 06/01/2022 09:46:19 -------------------------------------------------------------------------------- Multi-Disciplinary Care  Plan Details Patient Name: Date of Service: 06/03/2022, [name] 06/01/2022 9:30 A M Medical Record Number: 06/03/2022 Patient Account Number: [id number] Date of Birth/Sex: Treating RN: Feb 22, 1966 (56 y.o. 54 Primary Care Clarissia Mckeen: [name] Other Clinician: Referring Quentina Fronek: Treating Ervin Hensley/Extender: [name] in Treatment: 5 Active Inactive Wound/Skin Impairment Nursing Diagnoses: Impaired tissue integrity Goals: Patient/caregiver will verbalize understanding of skin care regimen Date Initiated: 04/24/2022 Target Resolution Date: 07/09/2022 Goal Status: Active Interventions: Provide education on ulcer and skin care Treatment Activities: Skin care regimen  initiated : 04/24/2022 Notes: Electronic Signature(s) Signed: 06/01/2022 5:35:57 PM By: Karie Schwalbe RN Entered By: Karie Schwalbe on 06/01/2022 17:00:48 -------------------------------------------------------------------------------- Pain Assessment Details Patient Name: Date of Service: [name] 06/01/2022 9:30 A M Medical Record Number: [id number] Patient Account Number: [id number] Date of Birth/Sex: Treating RN: Jun 06, 1966 (56 y.o. [name] Primary Care Corinthia Helmers: [name] Other Clinician: Referring Rich Paprocki: Treating Lorain Keast/Extender: [name] in Treatment: 5 Active Problems Location of Pain Severity and Description of Pain Patient Has Paino No Site Locations Pain Management and Medication Current Pain Management: Electronic Signature(s) Signed: 06/01/2022 5:35:57 PM By: Karie Schwalbe RN Entered By: Karie Schwalbe on 06/01/2022 17:00:16 -------------------------------------------------------------------------------- Patient/Caregiver Education Details Patient Name: Date of Service: Jimmy [name] 7/17/2023andnbsp9:30 A M Medical Record Number: [id number] Patient Account Number: [id number] Date  of Birth/Gender: Treating RN: 05/31/1966 (56 y.o. [name] Primary Care Physician: [name] Other Clinician: Referring Physician: Treating Physician/Extender: [name] in Treatment: 5 Education Assessment Education Provided To: Patient Education Topics Provided Wound/Skin Impairment: Methods: Explain/Verbal Responses: Return demonstration correctly Electronic Signature(s) Signed: 06/01/2022 5:35:57 PM By: Karie Schwalbe RN Entered By: Karie Schwalbe on 06/01/2022 17:01:10 -------------------------------------------------------------------------------- Vitals Details Patient Name: Date of Service: [name] 06/01/2022 9:30 A M Medical Record Number: [id number] Patient Account Number: [id number] Date of Birth/Sex: Treating RN: May 11, 1966 (56 y.o. [name] Primary Care Dhruti Ghuman: [name] Other Clinician: [name] Referring Verity Gilcrest: Treating Damisha Wolff/Extender: [name] in Treatment: 5 Vital Signs Time Taken: 09:30 Temperature (F): 98.4 Height (in): 71 Pulse (bpm): 71 Weight (lbs): 190 Respiratory Rate (breaths/min): 18 Body Mass Index (BMI): 26.5 Blood Pressure (mmHg): 125/69 Reference Range: 80 - 120 mg / dl Electronic Signature(s) Signed: 06/01/2022 11:18:29 AM By: [name] CHT EMT BS , , Entered By: [name] on 06/01/2022 10:44:11

## 2022-06-01 NOTE — Progress Notes (Signed)
KARLIS, CREGG ([id number]) Visit Report for 06/01/2022 SuperBill Details Patient Name: Date of Service: [name] 06/01/2022 Medical Record Number: [id number] Patient Account Number: [id number] Date of Birth/Sex: Treating RN: 05/01/1966 (56 y.o. 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, unspecified Z21 Asymptomatic human immunodeficiency virus [HIV] infection status K26.0 Acute duodenal ulcer with hemorrhage Facility Procedures CPT4 Code Description Modifier Quantity [id number] G0277-(Facility Use Only) HBOT full body chamber, , 4 ICD-10 Diagnosis Description M86.68 Other chronic osteomyelitis, other site Z87.828 Personal history of other (healed) physical injury and trauma 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

## 2022-06-01 NOTE — Progress Notes (Signed)
Jimmy [name], Jimmy [name] ([id number]) Visit Report for 05/29/2022 Problem List Details Patient Name: Date of Service: [name] 05/29/2022 8:00 A M Medical Record Number: [id number] Patient Account Number: [id number] Date of Birth/Sex: Treating RN: 08-Jun-1966 (56 y.o. 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 05/29/2022 Medical Record Number: [id number] Patient Account Number: [id number] Date of Birth/Sex: Treating RN: 02-Apr-1966 (55 y.o. 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, unspecified Z21 Asymptomatic human immunodeficiency virus [HIV] infection status K26.0 Acute duodenal ulcer with hemorrhage Facility  Procedures CPT4 Code: [id number] Description: G0277-(Facility Use Only) HBOT full body chamber, , ICD-10 Diagnosis Description M86.68 Other chronic osteomyelitis, other site Z87.828 Personal history of other (healed) physical injury and trauma 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,  unspecified Z21 Asymptomatic human immunodeficiency virus [HIV] infection status Modifier: Quantity: 4 Physician Procedures : CPT4 Code Description Modifier [id number] 71062 - WC PHYS HYPERBARIC OXYGEN THERAPY ICD-10 Diagnosis Description M86.68 Other chronic osteomyelitis, other site Z87.828 Personal history of other (healed) physical injury and trauma M26.9 Dentofacial  anomaly, unspecified Z21 Asymptomatic human immunodeficiency virus [HIV] infection status Quantity: 1 Electronic Signature(s) Signed: 05/29/2022 5:06:50 PM By: Baltazar Najjar MD Previous Signature: 05/29/2022 11:13:04 AM Version By: [name] EMT Entered By: Baltazar Najjar on 05/29/2022 17:06:49

## 2022-06-01 NOTE — Progress Notes (Addendum)
Jimmy [name], PADGET ([id number]) Visit Report for 06/01/2022 HBO Details Patient Name: Date of Service: [name] 06/01/2022 10:15 A M Medical Record Number: [id number] Patient Account Number: [id number] Date of Birth/Sex: Treating RN: [birth year] (56 y.o. 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 HBO Treatment Details Treatment Number: 19 Patient Type: Outpatient Chamber Type: Monoplace Chamber Serial #: [id number] Treatment Protocol: 2.0 ATA with 90 minutes oxygen, with two 5 minute air breaks Treatment Details Compression Rate Down: 2.0 psi / minute De-Compression Rate Up: 2.0 psi / minute A breaks and breathing ir Compress Tx Pressure periods Decompress Decompress Begins Reached (leave unused spaces Begins Ends blank) Chamber Pressure (ATA 1 2 2 2 2 2  --2 1 ) Clock Time (24 hr) 09:38 09:47 10:18 10:23 10:53 10:58 - - 11:28 11:36 Treatment Length: 118 (minutes) Treatment Segments: 4 Vital Signs Capillary Blood Glucose Reference Range: 80 - 120 mg / dl HBO Diabetic Blood Glucose Intervention Range: <131 mg/dl or mg/dl Time Vitals Blood Respiratory Capillary Blood Glucose Pulse Action Type: Pulse: Temperature: Taken: Pressure: Rate: Glucose (mg/dl): Meter #: Oximetry (%) Taken: Pre 09:30 125/69 71 18 98.4 Post 11:36 124/74 68 18 97.4 Treatment Response Treatment Toleration: Well Treatment Completion Status: Treatment Completed without Adverse Event Physician HBO Attestation: I certify that I supervised this HBO treatment in accordance with Medicare guidelines. A trained emergency response  team is readily available per Yes hospital policies and procedures. Continue HBOT as ordered. Yes Electronic Signature(s) Signed: 06/01/2022 1:43:05 PM By: 06/03/2022 DO Previous Signature: 06/01/2022 1:29:27 PM Version By: 06/03/2022 CHT EMT BS , , Entered By: [name] on 06/01/2022 13:42:37 -------------------------------------------------------------------------------- HBO Safety Checklist Details Patient Name: Date of Service: 06/03/2022 [name] 06/01/2022 10:15 A M Medical Record Number: 06/03/2022 Patient Account Number: [id number] Date of Birth/Sex: Treating RN: 05-23-1966 (56 y.o. 53 Primary Care [name]: [name] Other Clinician: [name] Referring Lamyah Creed: Treating Nikkie Liming/Extender: [name] in Treatment: 5 HBO Safety Checklist Items Safety Checklist Consent Form Signed Patient voided / foley secured and emptied When did you last eato 0630 Last dose of injectable or oral agent n/a Ostomy pouch emptied and vented if applicable NA All implantable devices assessed, documented and approved NA Intravenous access site secured and place NA Valuables secured Linens and cotton and cotton/polyester blend (less than 51% polyester) Personal oil-based products / skin lotions / body lotions removed Wigs or hairpieces removed NA Smoking or tobacco materials removed NA Books / newspapers / magazines / loose paper removed Cologne, aftershave, perfume and deodorant removed Jewelry removed (may wrap wedding band) Make-up removed NA Hair care products removed NA Battery operated devices (external) removed Heating patches and chemical warmers removed Titanium eyewear removed Nail polish cured greater than 10 hours NA Casting material cured greater than 10 hours NA Hearing aids removed NA Loose dentures or partials removed NA Prosthetics have been removed NA Patient demonstrates correct  use of air break device (if applicable) Patient concerns have been addressed Patient grounding bracelet on and cord attached to chamber Specifics for Inpatients (complete in addition to above) Medication sheet sent with patient NA Intravenous medications needed or due during therapy sent with patient NA Drainage  tubes (e.g. nasogastric tube or chest tube secured and vented) NA Endotracheal or Tracheotomy tube secured NA Cuff deflated of air and inflated with saline NA Airway suctioned NA Notes Paper version used prior to treatment. Electronic Signature(s) Signed: 06/01/2022 1:26:41 PM By: [name] CHT EMT BS , , Entered By: [name] on 06/01/2022 13:26:41

## 2022-06-01 NOTE — Progress Notes (Addendum)
STOY, FENN ([id number]) Visit Report for 06/01/2022 Arrival Information Details Patient Name: Date of Service: [name] 06/01/2022 10:15 A M Medical Record Number: [id number] Patient Account Number: [id number] Date of Birth/Sex: Treating RN: 09/04/1966 (56 y.o. [name] Primary Care Marry Kusch: [name] Other Clinician: [name] Referring Kourtland Coopman: Treating Esau Fridman/Extender: [name] in Treatment: 5 Visit Information History Since Last Visit All ordered tests and consults were completed: Yes Patient Arrived: Ambulatory Added or deleted any medications: No Arrival Time: 09:15 Any new allergies or adverse reactions: No Accompanied By: spouse Had a fall or experienced change in No Transfer Assistance: None activities of daily living that may affect Patient Identification Verified: Yes risk of falls: Secondary Verification Process Completed: Yes Signs or symptoms of abuse/neglect since last visito No Patient Requires Transmission-Based Precautions: No Hospitalized since last visit: No Patient Has Alerts: No Implantable device outside of the clinic excluding No cellular tissue based products placed in the center since last visit: Pain Present Now: No Electronic Signature(s) Signed: 06/01/2022 1:23:42 PM By: [name] CHT EMT BS , , Entered By: [name] on 06/01/2022 13:23:42 -------------------------------------------------------------------------------- Encounter Discharge Information Details Patient Name: Date of Service: [name], [name] 06/01/2022 10:15 A M Medical Record Number: [id number] Patient Account Number: [id number] Date of Birth/Sex: Treating RN: 26-Jan-1966 (56 y.o. [name] Primary Care Olney Monier: [name] Other Clinician: [name] Referring Nolita Kutter: Treating Annamary Buschman/Extender: [name] in Treatment: 5 Encounter Discharge  Information Items Discharge Condition: Stable Ambulatory Status: Ambulatory Discharge Destination: Home Transportation: Private Auto Accompanied By: spouse Schedule Follow-up Appointment: No Clinical Summary of Care: Electronic Signature(s) Signed: 06/01/2022 1:30:07 PM By: [name] CHT EMT BS , , Entered By: [name] on 06/01/2022 13:30:07 -------------------------------------------------------------------------------- Vitals Details Patient Name: Date of Service: [name] 06/01/2022 10:15 A M Medical Record Number: [id number] Patient Account Number: [id number] Date of Birth/Sex: Treating RN: Aug 02, 1966 (56 y.o. [name] Primary Care Kaijah Abts: [name] Other Clinician: [name] Referring Yajaira Doffing: Treating Soren Lazarz/Extender: [name] in Treatment: 5 Vital Signs Time Taken: 09:30 Temperature (F): 98.4 Height (in): 71 Pulse (bpm): 71 Weight (lbs): 190 Respiratory Rate (breaths/min): 18 Body Mass Index (BMI): 26.5 Blood Pressure (mmHg): 125/69 Reference Range: 80 - 120 mg / dl Electronic Signature(s) Signed: 06/01/2022 1:24:14 PM By: [name] CHT EMT BS , , Entered By: [name] on 06/01/2022 13:24:14
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
## 2022-06-02 ENCOUNTER — Encounter (HOSPITAL_BASED_OUTPATIENT_CLINIC_OR_DEPARTMENT_OTHER): Payer: BC Managed Care – PPO | Admitting: Internal Medicine

## 2022-06-02 DIAGNOSIS — Z87828 Personal history of other (healed) physical injury and trauma: Secondary | ICD-10-CM

## 2022-06-02 DIAGNOSIS — Z21 Asymptomatic human immunodeficiency virus [HIV] infection status: Secondary | ICD-10-CM | POA: Diagnosis not present

## 2022-06-02 DIAGNOSIS — M269 Dentofacial anomaly, unspecified: Secondary | ICD-10-CM

## 2022-06-02 DIAGNOSIS — M8668 Other chronic osteomyelitis, other site: Secondary | ICD-10-CM

## 2022-06-02 NOTE — Progress Notes (Signed)
[name], [name] ([id number]) Visit Report for 06/02/2022 Problem List Details Patient Name: Date of Service: Jimmy [name]. 06/02/2022 9:15 A M Medical Record Number: [id number] Patient Account Number: [id number] Date of Birth/Sex: Treating RN: 1966-07-18 (56 y.o. 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 06/02/2022 Medical Record Number: [id number] Patient Account Number: [id number] Date of Birth/Sex: Treating RN: 10/16/1966 (56 y.o. 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, unspecified Z21 Asymptomatic human immunodeficiency virus [HIV] infection status K26.0 Acute duodenal ulcer with hemorrhage Facility  Procedures CPT4 Code: [id number] Description: G0277-(Facility Use Only) HBOT full body chamber, , ICD-10 Diagnosis Description M86.68 Other chronic osteomyelitis, other site Z87.828 Personal history of other (healed) physical injury and trauma 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,  unspecified Z21 Asymptomatic human immunodeficiency virus [HIV] infection status Modifier: Quantity: 4 Physician Procedures : CPT4 Code Description Modifier [id number] - WC PHYS HYPERBARIC OXYGEN THERAPY ICD-10 Diagnosis Description M86.68 Other chronic osteomyelitis, other site Z87.828 Personal history of other (healed) physical injury and trauma M26.9 Dentofacial  anomaly, unspecified Z21 Asymptomatic human immunodeficiency virus [HIV] infection status Quantity: 1 Electronic Signature(s) Signed: 06/02/2022 1:01:06 PM By: [name] EMT Signed: 06/02/2022 4:15:59 PM By: Geralyn Corwin DO Entered By: [name] on 06/02/2022 13:01:06

## 2022-06-02 NOTE — Progress Notes (Addendum)
Jimmy, [name] ([id number]) Visit Report for 06/02/2022 HBO Details Patient Name: Date of Service: Jimmy [name]. 06/02/2022 9:15 A M Medical Record Number: [id number] Patient Account Number: [id number] Date of Birth/Sex: Treating RN: 02/14/1966 (56 y.o. Jimmy [name] Primary Care Khila Papp: [name] Other Clinician: [name] Referring Login Muckleroy: Treating Sulayman Manning/Extender: [name] in Treatment: 5 HBO Treatment Course Details Treatment Course Number: 1 Ordering Bradee Common: [name] Treatments Ordered: otal 40 HBO Treatment Start Date: 05/04/2022 HBO Indication: Chronic Refractory Osteomyelitis to of mandible HBO Treatment Details Treatment Number: 20 Patient Type: Outpatient Chamber Type: Monoplace Chamber Serial #: [id number] Treatment Protocol: 2.0 ATA with 90 minutes oxygen, with two 5 minute air breaks Treatment Details Compression Rate Down: 2.0 psi / minute De-Compression Rate Up: 2.0 psi / minute A breaks and breathing ir Compress Tx Pressure periods Decompress Decompress Begins Reached (leave unused spaces Begins Ends blank) Chamber Pressure (ATA 1 2 2 2 2 2  --2 1 ) Clock Time (24 hr) 09:23 09:35 10:05 10:10 10:40 10:46 - - 11:16 11:23 Treatment Length: 120 (minutes) Treatment Segments: 4 Vital Signs Capillary Blood Glucose Reference Range: 80 - 120 mg / dl HBO Diabetic Blood Glucose Intervention Range: <131 mg/dl or mg/dl Time Vitals Blood Respiratory Capillary Blood Glucose Pulse Action Type: Pulse: Temperature: Taken: Pressure: Rate: Glucose (mg/dl): Meter #: Oximetry (%) Taken: Pre 09:20 115/71 78 16 97.4 Post 11:25 113/79 74 18 97.7 Treatment Response Treatment Toleration: Well Treatment Completion Status: Treatment Completed without Adverse Event Physician HBO Attestation: I certify that I supervised this HBO treatment in accordance with Medicare guidelines. A trained emergency response  team is readily available per Yes hospital policies and procedures. Continue HBOT as ordered. Yes Electronic Signature(s) Signed: 06/02/2022 4:15:59 PM By: 06/04/2022 DO Previous Signature: 06/02/2022 11:29:22 AM Version By: 06/04/2022 EMT Entered By: [name] on 06/02/2022 15:56:10 -------------------------------------------------------------------------------- HBO Safety Checklist Details Patient Name: Date of Service: 06/04/2022, Jimmy [name] [name] 06/02/2022 9:15 A M Medical Record Number: 06/04/2022 Patient Account Number: [id number] Date of Birth/Sex: Treating RN: Dec 27, 1965 (56 y.o. 53 Primary Care [name]: Jimmy [name] Other Clinician: [name] Referring Mahalia Dykes: Treating Ashaun Gaughan/Extender: [name] in Treatment: 5 HBO Safety Checklist Items Safety Checklist Consent Form Signed Patient voided / foley secured and emptied When did you last eato 0630 Last dose of injectable or oral agent NA Ostomy pouch emptied and vented if applicable NA All implantable devices assessed, documented and approved NA Intravenous access site secured and place NA Valuables secured Linens and cotton and cotton/polyester blend (less than 51% polyester) Personal oil-based products / skin lotions / body lotions removed Wigs or hairpieces removed NA Smoking or tobacco materials removed Books / newspapers / magazines / loose paper removed Cologne, aftershave, perfume and deodorant removed Jewelry removed (may wrap wedding band) NA Make-up removed NA Hair care products removed Battery operated devices (external) removed Heating patches and chemical warmers removed Titanium eyewear removed NA Nail polish cured greater than 10 hours NA Casting material cured greater than 10 hours NA Hearing aids removed NA Loose dentures or partials removed NA Prosthetics have been removed NA Patient demonstrates correct use of air break  device (if applicable) Patient concerns have been addressed Patient grounding bracelet on and cord attached to chamber Specifics for Inpatients (complete in addition to above) Medication sheet sent with patient NA Intravenous medications needed or due during therapy sent with patient NA Drainage tubes (e.g. nasogastric tube  or chest tube secured and vented) NA Endotracheal or Tracheotomy tube secured NA Cuff deflated of air and inflated with saline NA Airway suctioned NA Notes The safety checklist was done before the treatment was started. Electronic Signature(s) Signed: 06/02/2022 11:19:27 AM By: [name] EMT Entered By: [name] on 06/02/2022 11:19:27

## 2022-06-02 NOTE — Progress Notes (Addendum)
WANE, MOLLETT ([id number]) Visit Report for 06/02/2022 Arrival Information Details Patient Name: Date of Service: [name] 06/02/2022 9:15 A M Medical Record Number: [id number] Patient Account Number: [id number] Date of Birth/Sex: Treating RN: 04/15/1966 (56 y.o. [name], [name] Primary Care Matthias Bogus: [name] Other Clinician: [name] Referring Silver Parkey: Treating Karalina Tift/Extender: [name] in Treatment: 5 Visit Information History Since Last Visit All ordered tests and consults were completed: Yes Patient Arrived: Ambulatory Added or deleted any medications: No Arrival Time: 09:15 Any new allergies or adverse reactions: No Accompanied By: Wife Had a fall or experienced change in No Transfer Assistance: None activities of daily living that may affect Patient Identification Verified: Yes risk of falls: Secondary Verification Process Completed: Yes Signs or symptoms of abuse/neglect since last visito No Patient Requires Transmission-Based Precautions: No Hospitalized since last visit: No Patient Has Alerts: No Implantable device outside of the clinic excluding No cellular tissue based products placed in the center since last visit: Pain Present Now: No Electronic Signature(s) Signed: 06/02/2022 11:16:40 AM By: [name] EMT Entered By: [name] on 06/02/2022 11:16:40 -------------------------------------------------------------------------------- Encounter Discharge Information Details Patient Name: Date of Service: DA Council Mechanic, [name] 06/02/2022 9:15 A M Medical Record Number: [id number] Patient Account Number: [id number] Date of Birth/Sex: Treating RN: 19-Jun-1966 (56 y.o. [name] Primary Care Onda Kattner: [name] Other Clinician: [name] Referring Kinsey Karch: Treating Alizza Sacra/Extender: [name] in Treatment: 5 Encounter Discharge Information  Items Discharge Condition: Stable Ambulatory Status: Ambulatory Discharge Destination: Home Transportation: Private Auto Accompanied By: Wife Schedule Follow-up Appointment: Yes Clinical Summary of Care: Electronic Signature(s) Signed: 06/02/2022 1:02:01 PM By: [name] EMT Entered By: [name] on 06/02/2022 13:02:01 -------------------------------------------------------------------------------- Vitals Details Patient Name: Date of Service: [name] 06/02/2022 9:15 A M Medical Record Number: [id number] Patient Account Number: [id number] Date of Birth/Sex: Treating RN: 1966/03/12 (56 y.o. [name] Primary Care Larya Charpentier: [name] Other Clinician: [name] Referring Square Jowett: Treating Syla Devoss/Extender: [name] in Treatment: 5 Vital Signs Time Taken: 09:20 Temperature (F): 97.4 Height (in): 71 Pulse (bpm): 78 Weight (lbs): 190 Respiratory Rate (breaths/min): 16 Body Mass Index (BMI): 26.5 Blood Pressure (mmHg): 115/71 Reference Range: 80 - 120 mg / dl Electronic Signature(s) Signed: 06/02/2022 11:17:28 AM By: [name] EMT Entered By: [name] on 06/02/2022 11:17:28

## 2022-06-03 ENCOUNTER — Encounter (HOSPITAL_BASED_OUTPATIENT_CLINIC_OR_DEPARTMENT_OTHER): Payer: BC Managed Care – PPO | Admitting: General Surgery

## 2022-06-03 DIAGNOSIS — Z87828 Personal history of other (healed) physical injury and trauma: Secondary | ICD-10-CM | POA: Diagnosis not present

## 2022-06-03 DIAGNOSIS — M8668 Other chronic osteomyelitis, other site: Secondary | ICD-10-CM | POA: Diagnosis not present

## 2022-06-03 DIAGNOSIS — M269 Dentofacial anomaly, unspecified: Secondary | ICD-10-CM | POA: Diagnosis not present

## 2022-06-03 DIAGNOSIS — Z21 Asymptomatic human immunodeficiency virus [HIV] infection status: Secondary | ICD-10-CM | POA: Diagnosis not present

## 2022-06-03 NOTE — Progress Notes (Addendum)
Jimmy, [name] ([id number]) Visit Report for 06/03/2022 HBO Details Patient Name: Date of Service: [name] 06/03/2022 9:30 A M Medical Record Number: [id number] Patient Account Number: [id number] Date of Birth/Sex: Treating RN: [birth year] (56 y.o. 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 HBO Treatment Details Treatment Number: 21 Patient Type: Outpatient Chamber Type: Monoplace Chamber Serial #: [id number] Treatment Protocol: 2.0 ATA with 90 minutes oxygen, with two 5 minute air breaks Treatment Details Compression Rate Down: 2.0 psi / minute De-Compression Rate Up: 2.0 psi / minute A breaks and breathing ir Compress Tx Pressure periods Decompress Decompress Begins Reached (leave unused spaces Begins Ends blank) Chamber Pressure (ATA 1 2 2 2 2 2  --2 1 ) Clock Time (24 hr) 09:41 09:50 10:20 10:25 10:55 11:00 - - 11:30 11:38 Treatment Length: 117 (minutes) Treatment Segments: 4 Vital Signs Capillary Blood Glucose Reference Range: 80 - 120 mg / dl HBO Diabetic Blood Glucose Intervention Range: <131 mg/dl or mg/dl Type: Time Vitals Blood Respiratory Capillary Blood Glucose Pulse Action Pulse: Temperature: Taken: Pressure: Rate: Glucose (mg/dl): Meter #: Oximetry (%) Taken: Pre 09:39 124/71 78 18 98 none per protocol Post 11:39 114/79 79 16 98.4 none per protocol Treatment Response Treatment Toleration: Well Treatment Completion Status: Treatment Completed without Adverse Event Physician HBO Attestation: I certify that I supervised this HBO treatment in accordance with  Medicare guidelines. A trained emergency response team is readily available per Yes hospital policies and procedures. Continue HBOT as ordered. Yes Electronic Signature(s) Signed: 06/03/2022 2:49:13 PM By: 06/05/2022 MD FACS Signed: 06/10/2022 5:39:28 PM By: 06/12/2022 CHT EMT BS , , Previous Signature: 06/03/2022 1:20:04 PM Version By: 06/05/2022 CHT EMT BS , , Previous Signature: 06/03/2022 12:32:40 PM Version By: 06/05/2022 MD FACS Previous Signature: 06/03/2022 10:40:23 AM Version By: 06/05/2022 CHT EMT BS , , Previous Signature: 06/03/2022 12:29:10 PM Version By: 06/05/2022 MD FACS Entered By: [name] on 06/03/2022 13:20:19 -------------------------------------------------------------------------------- HBO Safety Checklist Details Patient Name: Date of Service: 06/05/2022, Jimmy [name] [name] 06/03/2022 9:30 A M Medical Record Number: 06/05/2022 Patient Account Number: [id number] Date of Birth/Sex: Treating RN: [birth year] (57 y.o. 53 Primary Care Beau Vanduzer: Jimmy [name] Other Clinician: [name] Referring Amerie Beaumont: Treating Letonia Stead/Extender: [name] in Treatment: 5 HBO Safety Checklist Items Safety Checklist Consent Form Signed Patient voided / foley secured and emptied When did you last eato 0630 Last dose of injectable or oral agent n/a Ostomy pouch emptied and vented if applicable NA All implantable devices assessed, documented and approved NA Intravenous access site secured and place NA Valuables secured Linens and cotton and cotton/polyester blend (less than 51% polyester) Personal oil-based products / skin lotions / body lotions removed Wigs or hairpieces removed NA Smoking or tobacco materials removed NA Books / newspapers / magazines / loose paper removed Cologne, aftershave, perfume and deodorant removed Jewelry removed (may wrap wedding band) Make-up removed Hair  care products removed Battery operated devices (external) removed Heating patches and chemical warmers removed Titanium eyewear removed NA Nail polish cured greater than 10 hours Casting material cured greater than 10 hours Hearing aids removed Loose dentures or partials removed Prosthetics have been removed Patient  demonstrates correct use of air break device (if applicable) NA Patient concerns have been addressed NA Patient grounding bracelet on and cord attached to chamber NA Specifics for Inpatients (complete in addition to above) Medication sheet sent with patient NA Intravenous medications needed or due during therapy sent with patient NA Drainage tubes (e.g. nasogastric tube or chest tube secured and vented) NA Endotracheal or Tracheotomy tube secured NA Cuff deflated of air and inflated with saline NA Airway suctioned NA Notes Paper version used prior to treatment. Electronic Signature(s) Signed: 06/03/2022 10:40:01 AM By: [name] CHT EMT BS , , Entered By: [name] on 06/03/2022 10:40:00

## 2022-06-03 NOTE — Progress Notes (Addendum)
OLUWAFEMI, VILLELLA ([id number]) Visit Report for 06/03/2022 Arrival Information Details Patient Name: Date of Service: [name] 06/03/2022 9:30 A M Medical Record Number: [id number] Patient Account Number: [id number] Date of Birth/Sex: Treating RN: [birth year] (56 y.o. [name] Primary Care Delancey Moraes: [name] Other Clinician: [name] Referring Shizuo Biskup: Treating Calab Sachse/Extender: [name] in Treatment: 5 Visit Information History Since Last Visit All ordered tests and consults were completed: Yes Patient Arrived: Ambulatory Added or deleted any medications: No Arrival Time: 09:26 Any new allergies or adverse reactions: No Accompanied By: spouse Had a fall or experienced change in No Transfer Assistance: None activities of daily living that may affect Patient Identification Verified: Yes risk of falls: Secondary Verification Process Completed: Yes Signs or symptoms of abuse/neglect since last visito No Patient Requires Transmission-Based Precautions: No Hospitalized since last visit: No Patient Has Alerts: No Implantable device outside of the clinic excluding No cellular tissue based products placed in the center since last visit: Pain Present Now: No Electronic Signature(s) Signed: 06/10/2022 5:39:28 PM By: [name] CHT EMT BS , , Previous Signature: 06/03/2022 10:35:28 AM Version By: [name] CHT EMT BS , , Entered By: [name] on 06/03/2022 13:21:16 -------------------------------------------------------------------------------- Encounter Discharge Information Details Patient Name: Date of Service: [name], [name] 06/03/2022 9:30 A M Medical Record Number: [id number] Patient Account Number: [id number] Date of Birth/Sex: Treating RN: 03/10/1966 (56 y.o. [name] Primary Care Marjean Imperato: [name] Other Clinician: [name] Referring Kileigh Ortmann: Treating  Honor Frison/Extender: [name] in Treatment: 5 Encounter Discharge Information Items Discharge Condition: Stable Ambulatory Status: Ambulatory Discharge Destination: Home Transportation: Private Auto Accompanied By: spouse Schedule Follow-up Appointment: No Clinical Summary of Care: Electronic Signature(s) Signed: 06/10/2022 5:39:28 PM By: [name] CHT EMT BS , , Entered By: [name] on 06/03/2022 13:21:01 -------------------------------------------------------------------------------- Vitals Details Patient Name: Date of Service: [name] 06/03/2022 9:30 A M Medical Record Number: [id number] Patient Account Number: [id number] Date of Birth/Sex: Treating RN: 1966/03/29 (56 y.o. [name] Primary Care Tijana Walder: [name] Other Clinician: [name] Referring Telvin Reinders: Treating Morrill Bomkamp/Extender: [name] in Treatment: 5 Vital Signs Time Taken: 09:39 Temperature (F): 98.0 Height (in): 71 Pulse (bpm): 78 Weight (lbs): 190 Respiratory Rate (breaths/min): 18 Body Mass Index (BMI): 26.5 Blood Pressure (mmHg): 124/71 Reference Range: 80 - 120 mg / dl Electronic Signature(s) Signed: 06/03/2022 10:36:45 AM By: [name] CHT EMT BS , , Entered By: [name] on 06/03/2022 10:36:44

## 2022-06-04 ENCOUNTER — Encounter (HOSPITAL_BASED_OUTPATIENT_CLINIC_OR_DEPARTMENT_OTHER): Payer: BC Managed Care – PPO | Admitting: General Surgery

## 2022-06-04 DIAGNOSIS — Z21 Asymptomatic human immunodeficiency virus [HIV] infection status: Secondary | ICD-10-CM | POA: Diagnosis not present

## 2022-06-04 DIAGNOSIS — M269 Dentofacial anomaly, unspecified: Secondary | ICD-10-CM | POA: Diagnosis not present

## 2022-06-04 DIAGNOSIS — M272 Inflammatory conditions of jaws: Secondary | ICD-10-CM | POA: Diagnosis not present

## 2022-06-04 DIAGNOSIS — I82621 Acute embolism and thrombosis of deep veins of right upper extremity: Secondary | ICD-10-CM | POA: Diagnosis not present

## 2022-06-05 ENCOUNTER — Encounter (HOSPITAL_BASED_OUTPATIENT_CLINIC_OR_DEPARTMENT_OTHER): Payer: BC Managed Care – PPO | Admitting: Internal Medicine

## 2022-06-05 DIAGNOSIS — M8668 Other chronic osteomyelitis, other site: Secondary | ICD-10-CM | POA: Diagnosis not present

## 2022-06-05 DIAGNOSIS — Z87828 Personal history of other (healed) physical injury and trauma: Secondary | ICD-10-CM

## 2022-06-05 DIAGNOSIS — Z21 Asymptomatic human immunodeficiency virus [HIV] infection status: Secondary | ICD-10-CM | POA: Diagnosis not present

## 2022-06-05 DIAGNOSIS — M269 Dentofacial anomaly, unspecified: Secondary | ICD-10-CM | POA: Diagnosis not present

## 2022-06-05 NOTE — Progress Notes (Addendum)
Jimmy [name], BUTTERFIELD ([id number]) Visit Report for 06/05/2022 HBO Details Patient Name: Date of Service: [name] 06/05/2022 10:00 A M Medical Record Number: [id number] Patient Account Number: [id number] Date of Birth/Sex: Treating RN: October 05, 1966 (56 y.o. 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 HBO Treatment Details Treatment Number: 22 Patient Type: Outpatient Chamber Type: Monoplace Chamber Serial #: [id number] Treatment Protocol: 2.0 ATA with 90 minutes oxygen, with two 5 minute air breaks Treatment Details Compression Rate Down: 2.0 psi / minute De-Compression Rate Up: A breaks and breathing ir Compress Tx Pressure periods Decompress Decompress Begins Reached (leave unused spaces Begins Ends blank) Chamber Pressure (ATA 1 2 2 2 2 2  --2 1 ) Clock Time (24 hr) 09:41 09:50 10:20 10:25 10:55 11:00 - - 11:30 11:38 Treatment Length: 117 (minutes) Treatment Segments: 4 Vital Signs Capillary Blood Glucose Reference Range: 80 - 120 mg / dl HBO Diabetic Blood Glucose Intervention Range: <131 mg/dl or mg/dl Type: Time Vitals Blood Respiratory Capillary Blood Glucose Pulse Action Pulse: Temperature: Taken: Pressure: Rate: Glucose (mg/dl): Meter #: Oximetry (%) Taken: Pre 09:37 120/71 79 18 98.1 none per protocol Post 11:40 125/77 72 18 97.9 none per protocol Treatment Response Treatment Toleration: Well Treatment Completion Status: Treatment Completed without Adverse Event Physician HBO Attestation: I certify that I supervised this HBO treatment in accordance with Medicare guidelines. A trained  emergency response team is readily available per Yes hospital policies and procedures. Continue HBOT as ordered. Yes Electronic Signature(s) Signed: 06/05/2022 3:06:54 PM By: 06/07/2022 DO Previous Signature: 06/05/2022 1:02:45 PM Version By: 06/07/2022 CHT EMT BS , , Previous Signature: 06/05/2022 12:49:11 PM Version By: 06/07/2022 CHT EMT BS , , Entered By: [name] on 06/05/2022 15:05:31 -------------------------------------------------------------------------------- HBO Safety Checklist Details Patient Name: Date of Service: 06/07/2022 [name] 06/05/2022 10:00 A M Medical Record Number: 06/07/2022 Patient Account Number: [id number] Date of Birth/Sex: Treating RN: 15-Mar-1966 (56 y.o. 54 Primary Care Zelia Yzaguirre: [name] Other Clinician: [name] Referring Beauregard Jarrells: Treating Darshana Curnutt/Extender: [name] in Treatment: 6 HBO Safety Checklist Items Safety Checklist Consent Form Signed Patient voided / foley secured and emptied When did you last eato 0630 Last dose of injectable or oral agent n/a Ostomy pouch emptied and vented if applicable NA All implantable devices assessed, documented and approved NA Intravenous access site secured and place NA Valuables secured Linens and cotton and cotton/polyester blend (less than 51% polyester) Personal oil-based products / skin lotions / body lotions removed Wigs or hairpieces removed NA Smoking or tobacco materials removed NA Books / newspapers / magazines / loose paper removed Cologne, aftershave, perfume and deodorant removed Jewelry removed (may wrap wedding band) Make-up removed NA Hair care products removed Battery operated devices (external) removed Heating patches and chemical warmers removed Titanium eyewear removed NA Nail polish cured greater than 10 hours NA Casting material cured greater than 10 hours NA Hearing aids  removed NA Loose dentures or partials removed NA Prosthetics have been removed NA Patient demonstrates correct use of air break device (if applicable) Patient concerns have been addressed Patient grounding bracelet on and cord attached to chamber Specifics for Inpatients (complete in addition to above) Medication sheet  sent with patient NA Intravenous medications needed or due during therapy sent with patient NA Drainage tubes (e.g. nasogastric tube or chest tube secured and vented) NA Endotracheal or Tracheotomy tube secured NA Cuff deflated of air and inflated with saline NA Airway suctioned NA Notes Paper version used prior to treatment. Electronic Signature(s) Signed: 06/05/2022 9:57:14 AM By: [name] CHT EMT BS , , Entered By: [name] on 06/05/2022 09:57:14
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
## 2022-06-08 ENCOUNTER — Encounter (HOSPITAL_BASED_OUTPATIENT_CLINIC_OR_DEPARTMENT_OTHER): Payer: BC Managed Care – PPO | Admitting: Internal Medicine

## 2022-06-08 DIAGNOSIS — Z87828 Personal history of other (healed) physical injury and trauma: Secondary | ICD-10-CM

## 2022-06-08 DIAGNOSIS — M269 Dentofacial anomaly, unspecified: Secondary | ICD-10-CM

## 2022-06-08 DIAGNOSIS — Z21 Asymptomatic human immunodeficiency virus [HIV] infection status: Secondary | ICD-10-CM | POA: Diagnosis not present

## 2022-06-08 DIAGNOSIS — M8668 Other chronic osteomyelitis, other site: Secondary | ICD-10-CM | POA: Diagnosis not present

## 2022-06-08 NOTE — Progress Notes (Addendum)
Jimmy [name], Jimmy [name] ([id number]) Visit Report for 06/08/2022 Arrival Information Details Patient Name: Date of Service: [name] 06/08/2022 10:00 A M Medical Record Number: [id number] Patient Account Number: [id number] Date of Birth/Sex: Treating RN: 1966/09/06 (56 y.o. Jimmy [name], [name] Primary Care Le Faulcon: [name] Other Clinician: [name] Referring Aianna Fahs: Treating Alvah Lagrow/Extender: [name] in Treatment: 6 Visit Information History Since Last Visit All ordered tests and consults were completed: Yes Patient Arrived: Ambulatory Added or deleted any medications: No Arrival Time: 10:10 Any new allergies or adverse reactions: No Accompanied By: Wife Had a fall or experienced change in No Transfer Assistance: None activities of daily living that may affect Patient Identification Verified: Yes risk of falls: Secondary Verification Process Completed: Yes Signs or symptoms of abuse/neglect since last visito No Patient Requires Transmission-Based Precautions: No Hospitalized since last visit: No Patient Has Alerts: No Implantable device outside of the clinic excluding No cellular tissue based products placed in the center since last visit: Pain Present Now: No Electronic Signature(s) Signed: 06/08/2022 10:11:45 AM By: [name] EMT Entered By: [name] on 06/08/2022 10:11:44 -------------------------------------------------------------------------------- Encounter Discharge Information Details Patient Name: Date of Service: [name] 06/08/2022 10:00 A M Medical Record Number: [id number] Patient Account Number: [id number] Date of Birth/Sex: Treating RN: 1965-12-15 (56 y.o. Jimmy [name] Primary Care Keonta Alsip: [name] Other Clinician: [name] Referring Takoda Siedlecki: Treating Luay Balding/Extender: [name] in Treatment: 6 Encounter Discharge Information Items Discharge  Condition: Stable Ambulatory Status: Ambulatory Discharge Destination: Home Transportation: Private Auto Accompanied By: None Schedule Follow-up Appointment: Yes Clinical Summary of Care: Electronic Signature(s) Signed: 06/08/2022 12:10:38 PM By: [name] EMT Entered By: [name] on 06/08/2022 12:10:37 -------------------------------------------------------------------------------- Vitals Details Patient Name: Date of Service: [name] 06/08/2022 10:00 A M Medical Record Number: [id number] Patient Account Number: [id number] Date of Birth/Sex: Treating RN: 28-Mar-1966 (56 y.o. Jimmy [name], [name] Primary Care Sufyaan Palma: [name] Other Clinician: [name] Referring Kyndell Zeiser: Treating Shakeela Rabadan/Extender: [name] in Treatment: 6 Vital Signs Time Taken: 09:24 Temperature (F): 97.8 Height (in): 71 Pulse (bpm): 86 Weight (lbs): 190 Respiratory Rate (breaths/min): 16 Body Mass Index (BMI): 26.5 Blood Pressure (mmHg): 103/65 Reference Range: 80 - 120 mg / dl Electronic Signature(s) Signed: 06/08/2022 10:12:24 AM By: [name] EMT Entered By: [name] on 06/08/2022 10:12:24

## 2022-06-08 NOTE — Progress Notes (Addendum)
Jimmy [name], Jimmy [name] ([id number]) Visit Report for 06/08/2022 HBO Details Patient Name: Date of Service: [name] 06/08/2022 10:00 A M Medical Record Number: [id number] Patient Account Number: [id number] Date of Birth/Sex: Treating RN: [birth year] (56 y.o. 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 HBO Treatment Details Treatment Number: 23 Patient Type: Outpatient Chamber Type: Monoplace Chamber Serial #: [id number] Treatment Protocol: 2.0 ATA with 90 minutes oxygen, with two 5 minute air breaks Treatment Details Compression Rate Down: 2.0 psi / minute De-Compression Rate Up: A breaks and breathing ir Compress Tx Pressure periods Decompress Decompress Begins Reached (leave unused spaces Begins Ends blank) Chamber Pressure (ATA 1 2 2 2 2 2  --2 1 ) Clock Time (24 hr) 09:27 09:38 10:08 10:13 10:43 10:48 - - 11:18 11:28 Treatment Length: 121 (minutes) Treatment Segments: 4 Vital Signs Capillary Blood Glucose Reference Range: 80 - 120 mg / dl HBO Diabetic Blood Glucose Intervention Range: <131 mg/dl or mg/dl Time Vitals Blood Respiratory Capillary Blood Glucose Pulse Action Type: Pulse: Temperature: Taken: Pressure: Rate: Glucose (mg/dl): Meter #: Oximetry (%) Taken: Pre 09:24 103/65 86 16 97.8 Post 11:30 117/71 71 18 97.9 Treatment Response Treatment Toleration: Well Treatment Completion Status: Treatment Completed without Adverse Event Physician HBO Attestation: I certify that I supervised this HBO treatment in accordance with Medicare guidelines. A trained emergency response team is readily  available per Yes hospital policies and procedures. Continue HBOT as ordered. Yes Electronic Signature(s) Signed: 06/08/2022 3:08:01 PM By: 06/10/2022 DO Previous Signature: 06/08/2022 12:09:13 PM Version By: 06/10/2022 EMT Previous Signature: 06/08/2022 10:15:16 AM Version By: 06/10/2022 EMT Entered By: [name] on 06/08/2022 14:55:24 -------------------------------------------------------------------------------- HBO Safety Checklist Details Patient Name: Date of Service: 06/10/2022 [name] 06/08/2022 10:00 A M Medical Record Number: 06/10/2022 Patient Account Number: [id number] Date of Birth/Sex: Treating RN: 09-28-1966 (56 y.o. 54, [name] Primary Care Sergio Zawislak: [name] Other Clinician: [name] Referring Kimber Fritts: Treating Gayna Braddy/Extender: [name] in Treatment: 6 HBO Safety Checklist Items Safety Checklist Consent Form Signed Patient voided / foley secured and emptied When did you last eato 0500 Last dose of injectable or oral agent NA Ostomy pouch emptied and vented if applicable NA All implantable devices assessed, documented and approved NA Intravenous access site secured and place NA Valuables secured Linens and cotton and cotton/polyester blend (less than 51% polyester) Personal oil-based products / skin lotions / body lotions removed Wigs or hairpieces removed NA Smoking or tobacco materials removed Books / newspapers / magazines / loose paper removed Cologne, aftershave, perfume and deodorant removed Jewelry removed (may wrap wedding band) NA Make-up removed NA Hair care products removed Battery operated devices (external) removed Heating patches and chemical warmers removed Titanium eyewear removed NA Nail polish cured greater than 10 hours NA Casting material cured greater than 10 hours NA Hearing aids removed NA Loose dentures or partials removed NA Prosthetics have been  removed NA Patient demonstrates correct use of air break device (if applicable) Patient concerns have been addressed Patient grounding bracelet on and cord attached to chamber Specifics for Inpatients (complete in addition to above) Medication sheet sent with patient NA Intravenous medications needed or due during therapy sent with patient  NA Drainage tubes (e.g. nasogastric tube or chest tube secured and vented) NA Endotracheal or Tracheotomy tube secured NA Cuff deflated of air and inflated with saline NA Airway suctioned NA Notes The safety checklist was done before treatment was started. Electronic Signature(s) Signed: 06/08/2022 10:13:41 AM By: [name] EMT Entered By: [name] on 06/08/2022 10:13:41

## 2022-06-08 NOTE — Progress Notes (Signed)
Jimmy [name], Jimmy [name] ([id number]) Visit Report for 06/08/2022 Problem List Details Patient Name: Date of Service: Jimmy [name] 06/08/2022 10:00 A M Medical Record Number: [id number] Patient Account Number: [id number] Date of Birth/Sex: Treating RN: 12-15-1965 (56 y.o. 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 06/08/2022 Medical Record Number: [id number] Patient Account Number: [id number] Date of Birth/Sex: Treating RN: [birth year] (56 y.o. 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, unspecified Z21 Asymptomatic human immunodeficiency virus [HIV] infection status K26.0 Acute duodenal ulcer with hemorrhage Facility  Procedures CPT4 Code: [id number] Description: G0277-(Facility Use Only) HBOT full body chamber, , ICD-10 Diagnosis Description M86.68 Other chronic osteomyelitis, other site Z87.828 Personal history of other (healed) physical injury and trauma 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,  unspecified Z21 Asymptomatic human immunodeficiency virus [HIV] infection status Modifier: Quantity: 4 Physician Procedures : CPT4 Code Description Modifier [id number] - WC PHYS HYPERBARIC OXYGEN THERAPY ICD-10 Diagnosis Description M86.68 Other chronic osteomyelitis, other site Z87.828 Personal history of other (healed) physical injury and trauma M26.9 Dentofacial  anomaly, unspecified Z21 Asymptomatic human immunodeficiency virus [HIV] infection status Quantity: 1 Electronic Signature(s) Signed: 06/08/2022 12:09:46 PM By: [name] EMT Signed: 06/08/2022 3:08:01 PM By: Geralyn Corwin DO Entered By: [name] on 06/08/2022 12:09:46

## 2022-06-09 ENCOUNTER — Encounter (HOSPITAL_BASED_OUTPATIENT_CLINIC_OR_DEPARTMENT_OTHER): Payer: BC Managed Care – PPO | Admitting: Internal Medicine

## 2022-06-09 DIAGNOSIS — Z87828 Personal history of other (healed) physical injury and trauma: Secondary | ICD-10-CM | POA: Diagnosis not present

## 2022-06-09 DIAGNOSIS — Z21 Asymptomatic human immunodeficiency virus [HIV] infection status: Secondary | ICD-10-CM | POA: Diagnosis not present

## 2022-06-09 DIAGNOSIS — M269 Dentofacial anomaly, unspecified: Secondary | ICD-10-CM

## 2022-06-09 DIAGNOSIS — M8668 Other chronic osteomyelitis, other site: Secondary | ICD-10-CM

## 2022-06-09 NOTE — Progress Notes (Signed)
ZEDEKIAH, HINDERMAN ([id number]) Visit Report for 06/09/2022 HBO Safety Checklist Details Patient Name: Date of Service: [name] 06/09/2022 10:00 A M Medical Record Number: [id number] Patient Account Number: [id number] Date of Birth/Sex: Treating RN: [birth year] (56 y.o. Jimmy [name] Primary Care Sandford Diop: [name] Other Clinician: [name] Referring Ryliegh Mcduffey: Treating Abem Shaddix/Extender: [name] in Treatment: 6 HBO Safety Checklist Items Safety Checklist Consent Form Signed Patient voided / foley secured and emptied When did you last eato 0700 Last dose of injectable or oral agent NA Ostomy pouch emptied and vented if applicable NA All implantable devices assessed, documented and approved NA Intravenous access site secured and place NA Valuables secured Linens and cotton and cotton/polyester blend (less than 51% polyester) Personal oil-based products / skin lotions / body lotions removed Wigs or hairpieces removed NA Smoking or tobacco materials removed Books / newspapers / magazines / loose paper removed Cologne, aftershave, perfume and deodorant removed Jewelry removed (may wrap wedding band) NA Make-up removed NA Hair care products removed Battery operated devices (external) removed Heating patches and chemical warmers removed Titanium eyewear removed NA Nail polish cured greater than 10 hours NA Casting material cured greater than 10 hours NA Hearing aids removed NA Loose dentures or partials removed NA Prosthetics have been removed NA Patient demonstrates correct use of air break device (if applicable) Patient concerns have been addressed Patient grounding bracelet on and cord attached to chamber Specifics for Inpatients (complete in addition to above) Medication sheet sent with patient NA Intravenous medications needed or due during therapy sent with patient NA Drainage tubes (e.g. nasogastric tube  or chest tube secured and vented) NA Endotracheal or Tracheotomy tube secured NA Cuff deflated of air and inflated with saline NA Airway suctioned NA Notes The safety checklist was done before treatment was started. Electronic Signature(s) Signed: 06/09/2022 11:34:14 AM By: [name] EMT Entered By: [name] on 06/09/2022 11:34:14

## 2022-06-09 NOTE — Progress Notes (Addendum)
CORTLANDT, CAPUANO ([id number]) Visit Report for 06/09/2022 Arrival Information Details Patient Name: Date of Service: [name] 06/09/2022 10:00 A M Medical Record Number: [id number] Patient Account Number: [id number] Date of Birth/Sex: Treating RN: [birth year] (56 y.o. [name], [name] Primary Care Casimer Russett: [name] Other Clinician: [name] Referring Avante Carneiro: Treating Cayleb Jarnigan/Extender: [name] in Treatment: 6 Visit Information History Since Last Visit All ordered tests and consults were completed: Yes Patient Arrived: Ambulatory Added or deleted any medications: No Arrival Time: 09:11 Any new allergies or adverse reactions: No Accompanied By: Wife Had a fall or experienced change in No Transfer Assistance: None activities of daily living that may affect Patient Identification Verified: Yes risk of falls: Secondary Verification Process Completed: Yes Signs or symptoms of abuse/neglect since last visito No Patient Requires Transmission-Based Precautions: No Hospitalized since last visit: No Patient Has Alerts: No Implantable device outside of the clinic excluding No cellular tissue based products placed in the center since last visit: Pain Present Now: No Electronic Signature(s) Signed: 06/09/2022 11:32:25 AM By: [name] EMT Entered By: [name] on 06/09/2022 11:32:25 -------------------------------------------------------------------------------- Encounter Discharge Information Details Patient Name: Date of Service: [name] 06/09/2022 10:00 A M Medical Record Number: [id number] Patient Account Number: [id number] Date of Birth/Sex: Treating RN: 1966/03/02 (56 y.o. [name] Primary Care Farah Benish: [name] Other Clinician: [name] Referring Tifany Hirsch: Treating Kayelyn Lemon/Extender: [name] in Treatment: 6 Encounter Discharge Information  Items Discharge Condition: Stable Ambulatory Status: Ambulatory Discharge Destination: Home Transportation: Private Auto Accompanied By: Wife Schedule Follow-up Appointment: Yes Clinical Summary of Care: Electronic Signature(s) Signed: 06/09/2022 11:37:47 AM By: [name] EMT Entered By: [name] on 06/09/2022 11:37:47 -------------------------------------------------------------------------------- Vitals Details Patient Name: Date of Service: [name] 06/09/2022 10:00 A M Medical Record Number: [id number] Patient Account Number: [id number] Date of Birth/Sex: Treating RN: [birth year] (56 y.o. [name] Primary Care Talita Recht: [name] Other Clinician: [name] Referring Sharalee Witman: Treating Lyndon Chenoweth/Extender: [name] in Treatment: 6 Vital Signs Time Taken: 09:14 Temperature (F): 97.2 Height (in): 71 Pulse (bpm): 77 Weight (lbs): 190 Respiratory Rate (breaths/min): 16 Body Mass Index (BMI): 26.5 Blood Pressure (mmHg): 120/73 Reference Range: 80 - 120 mg / dl Electronic Signature(s) Signed: 06/09/2022 11:32:51 AM By: [name] EMT Entered By: [name] on 06/09/2022 11:32:51

## 2022-06-10 ENCOUNTER — Encounter (HOSPITAL_BASED_OUTPATIENT_CLINIC_OR_DEPARTMENT_OTHER): Payer: BC Managed Care – PPO | Admitting: General Surgery

## 2022-06-10 DIAGNOSIS — M8668 Other chronic osteomyelitis, other site: Secondary | ICD-10-CM | POA: Diagnosis not present

## 2022-06-10 DIAGNOSIS — Z87828 Personal history of other (healed) physical injury and trauma: Secondary | ICD-10-CM | POA: Diagnosis not present

## 2022-06-10 DIAGNOSIS — M269 Dentofacial anomaly, unspecified: Secondary | ICD-10-CM | POA: Diagnosis not present

## 2022-06-10 DIAGNOSIS — Z21 Asymptomatic human immunodeficiency virus [HIV] infection status: Secondary | ICD-10-CM | POA: Diagnosis not present

## 2022-06-10 NOTE — Progress Notes (Signed)
Jimmy [name], Jimmy [name] ([id number]) Visit Report for 06/05/2022 Arrival Information Details Patient Name: Date of Service: [name] 06/05/2022 10:00 A M Medical Record Number: [id number] Patient Account Number: [id number] Date of Birth/Sex: Treating RN: 10-05-1966 (56 y.o. Jimmy [name] Primary Care Jimmy [name]: Jimmy [name] Other Clinician: [name] Referring Jimmy [name]: Treating Jimmy [name]/Extender: [name] in Treatment: 6 Visit Information History Since Last Visit All ordered tests and consults were completed: Yes Patient Arrived: Ambulatory Added or deleted any medications: No Arrival Time: 09:25 Any new allergies or adverse reactions: No Accompanied By: spouse Had a fall or experienced change in No Transfer Assistance: None activities of daily living that may affect Patient Identification Verified: Yes risk of falls: Secondary Verification Process Completed: Yes Signs or symptoms of abuse/neglect since last visito No Patient Requires Transmission-Based Precautions: No Hospitalized since last visit: No Patient Has Alerts: No Implantable device outside of the clinic excluding No cellular tissue based products placed in the center since last visit: Pain Present Now: No Electronic Signature(s) Signed: 06/10/2022 5:39:06 PM By: [name] CHT EMT BS , , Entered By: [name] on 06/05/2022 09:54:31 -------------------------------------------------------------------------------- Encounter Discharge Information Details Patient Name: Date of Service: Jimmy [name], Jimmy Birks [name] 06/05/2022 10:00 A M Medical Record Number: [id number] Patient Account Number: [id number] Date of Birth/Sex: Treating RN: Sep 01, 1966 (56 y.o. Jimmy [name] Primary Care Jimmy [name]: Jimmy [name] Other Clinician: [name] Referring Jimmy [name]: Treating Jimmy [name]/Extender: [name] in Treatment: 6 Encounter Discharge  Information Items Discharge Condition: Stable Ambulatory Status: Ambulatory Discharge Destination: Home Transportation: Private Auto Accompanied By: spouse Schedule Follow-up Appointment: No Clinical Summary of Care: Electronic Signature(s) Signed: 06/05/2022 1:04:52 PM By: [name] CHT EMT BS , , Entered By: [name] on 06/05/2022 13:04:51 -------------------------------------------------------------------------------- Vitals Details Patient Name: Date of Service: Jimmy Nian [name] 06/05/2022 10:00 A M Medical Record Number: [id number] Patient Account Number: [id number] Date of Birth/Sex: Treating RN: 1966-04-09 (56 y.o. Jimmy [name] Primary Care Terrace Chiem: Jimmy [name] Other Clinician: [name] Referring Tola Meas: Treating Jimmy [name]/Extender: [name] in Treatment: 6 Vital Signs Time Taken: 09:37 Temperature (F): 98.1 Height (in): 71 Pulse (bpm): 79 Weight (lbs): 190 Respiratory Rate (breaths/min): 18 Body Mass Index (BMI): 26.5 Blood Pressure (mmHg): 120/71 Reference Range: 80 - 120 mg / dl Electronic Signature(s) Signed: 06/10/2022 5:39:06 PM By: [name] CHT EMT BS , , Entered By: [name] on 06/05/2022 09:55:18

## 2022-06-10 NOTE — Progress Notes (Signed)
CEBERT, DETTMANN ([id number]) Visit Report for 06/10/2022 SuperBill Details Patient Name: Date of Service: [name] 06/10/2022 Medical Record Number: [id number] Patient Account Number: [id number] Date of Birth/Sex: Treating RN: Dec 17, 1965 (57 y.o. 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, unspecified Z21 Asymptomatic human immunodeficiency virus [HIV] infection status K26.0 Acute duodenal ulcer with hemorrhage Facility Procedures CPT4 Code Description Modifier Quantity [id number] G0277-(Facility Use Only) HBOT full body chamber, , 4 ICD-10 Diagnosis Description M86.68 Other chronic osteomyelitis, other site Z87.828 Personal history of other (healed) physical injury and trauma 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

## 2022-06-10 NOTE — Progress Notes (Signed)
ASAAD, GULLEY ([id number]) Visit Report for 06/03/2022 SuperBill Details Patient Name: Date of Service: [name] 06/03/2022 Medical Record Number: [id number] Patient Account Number: [id number] Date of Birth/Sex: Treating RN: 10/09/1966 (56 y.o. 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, unspecified Z21 Asymptomatic human immunodeficiency virus [HIV] infection status K26.0 Acute duodenal ulcer with hemorrhage Facility Procedures CPT4 Code Description Modifier Quantity [id number] G0277-(Facility Use Only) HBOT full body chamber, , 4 ICD-10 Diagnosis Description M86.68 Other chronic osteomyelitis, other site Z87.828 Personal history of other (healed) physical injury and trauma 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

## 2022-06-10 NOTE — Progress Notes (Signed)
[name], [name] ([id number]) Visit Report for 06/10/2022 Arrival Information Details Patient Name: Date of Service: [name] 06/10/2022 10:00 A M Medical Record Number: [id number] Patient Account Number: [id number] Date of Birth/Sex: Treating RN: 1966/03/21 (56 y.o. Jimmy [name] Primary Care Mahathi Pokorney: [name] Other Clinician: [name] Referring Yaqub Arney: Treating Deeksha Cotrell/Extender: [name] in Treatment: 6 Visit Information History Since Last Visit All ordered tests and consults were completed: Yes Patient Arrived: Ambulatory Added or deleted any medications: No Arrival Time: 09:16 Any new allergies or adverse reactions: No Accompanied By: self Had a fall or experienced change in No Transfer Assistance: None activities of daily living that may affect Patient Identification Verified: Yes risk of falls: Secondary Verification Process Completed: Yes Signs or symptoms of abuse/neglect since last visito No Patient Requires Transmission-Based Precautions: No Hospitalized since last visit: No Patient Has Alerts: No Implantable device outside of the clinic excluding No cellular tissue based products placed in the center since last visit: Pain Present Now: No Electronic Signature(s) Signed: 06/10/2022 11:40:22 AM By: [name] CHT EMT BS , , Entered By: [name] on 06/10/2022 11:40:22 -------------------------------------------------------------------------------- Encounter Discharge Information Details Patient Name: Date of Service: Jimmy [name]. 06/10/2022 10:00 A M Medical Record Number: [id number] Patient Account Number: [id number] Date of Birth/Sex: Treating RN: 07-Aug-1966 (56 y.o. Jimmy [name] Primary Care Lajuanda Penick: [name] Other Clinician: [name] Referring Marquest Gunkel: Treating Piper Albro/Extender: [name] in Treatment: 6 Encounter Discharge  Information Items Discharge Condition: Stable Ambulatory Status: Ambulatory Discharge Destination: Home Transportation: Private Auto Accompanied By: spouse Schedule Follow-up Appointment: No Clinical Summary of Care: Electronic Signature(s) Signed: 06/10/2022 11:44:18 AM By: [name] CHT EMT BS , , Entered By: [name] on 06/10/2022 11:44:18 -------------------------------------------------------------------------------- Vitals Details Patient Name: Date of Service: Jimmy [name]. 06/10/2022 10:00 A M Medical Record Number: [id number] Patient Account Number: [id number] Date of Birth/Sex: Treating RN: 09-Sep-1966 (56 y.o. Jimmy [name] Primary Care Eunice Winecoff: [name] Other Clinician: [name] Referring Lizeth Bencosme: Treating Stoy Fenn/Extender: [name] in Treatment: 6 Vital Signs Time Taken: 09:19 Temperature (F): 97.9 Height (in): 71 Pulse (bpm): 92 Weight (lbs): 190 Respiratory Rate (breaths/min): 18 Body Mass Index (BMI): 26.5 Blood Pressure (mmHg): 127/67 Reference Range: 80 - 120 mg / dl Electronic Signature(s) Signed: 06/10/2022 11:40:44 AM By: [name] CHT EMT BS , , Entered By: [name] on 06/10/2022 11:40:44

## 2022-06-10 NOTE — Progress Notes (Addendum)
KAVEH, KISSINGER ([id number]) Visit Report for 06/10/2022 HBO Details Patient Name: Date of Service: [name] 06/10/2022 10:00 A M Medical Record Number: [id number] Patient Account Number: [id number] Date of Birth/Sex: Treating RN: Mar 22, 1966 (56 y.o. 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 HBO Treatment Details Treatment Number: 25 Patient Type: Outpatient Chamber Type: Monoplace Chamber Serial #: [id number] Treatment Protocol: 2.0 ATA with 90 minutes oxygen, with two 5 minute air breaks Treatment Details Compression Rate Down: 2.0 psi / minute De-Compression Rate Up: 2.0 psi / minute A breaks and breathing ir Compress Tx Pressure periods Decompress Decompress Begins Reached (leave unused spaces Begins Ends blank) Chamber Pressure (ATA 1 2 2 2 2 2  --2 1 ) Clock Time (24 hr) 09:23 09:34 10:04 10:09 10:39 10:44 - - 11:14 11:22 Treatment Length: 119 (minutes) Treatment Segments: 4 Vital Signs Capillary Blood Glucose Reference Range: 80 - 120 mg / dl HBO Diabetic Blood Glucose Intervention Range: <131 mg/dl or mg/dl Type: Time Vitals Blood Respiratory Capillary Blood Glucose Pulse Action Pulse: Temperature: Taken: Pressure: Rate: Glucose (mg/dl): Meter #: Oximetry (%) Taken: Pre 09:19 127/67 92 18 97.9 none per protocol Post 11:24 108/79 76 18 97.1 none per protocol Treatment Response Treatment Toleration: Well Treatment Completion Status: Treatment Completed without Adverse Event Physician HBO Attestation: I certify that I supervised this HBO treatment in accordance with  Medicare guidelines. A trained emergency response team is readily available per Yes hospital policies and procedures. Continue HBOT as ordered. Yes Electronic Signature(s) Signed: 06/10/2022 12:40:09 PM By: 06/12/2022 MD FACS Previous Signature: 06/10/2022 11:43:33 AM Version By: 06/12/2022 CHT EMT BS , , Entered By: [name] on 06/10/2022 12:40:09 -------------------------------------------------------------------------------- HBO Safety Checklist Details Patient Name: Date of Service: 06/12/2022 [name] 06/10/2022 10:00 A M Medical Record Number: 06/12/2022 Patient Account Number: [id number] Date of Birth/Sex: Treating RN: 04-12-1966 (56 y.o. 54 Primary Care Jhaden Pizzuto: [name] Other Clinician: [name] Referring Maybel Dambrosio: Treating Bode Pieper/Extender: [name] in Treatment: 6 HBO Safety Checklist Items Safety Checklist Consent Form Signed Patient voided / foley secured and emptied When did you last eato 0600 Last dose of injectable or oral agent n/a Ostomy pouch emptied and vented if applicable NA All implantable devices assessed, documented and approved NA Intravenous access site secured and place NA Valuables secured Linens and cotton and cotton/polyester blend (less than 51% polyester) Personal oil-based products / skin lotions / body lotions removed Wigs or hairpieces removed NA Smoking or tobacco materials removed NA Books / newspapers / magazines / loose paper removed Cologne, aftershave, perfume and deodorant removed Jewelry removed (may wrap wedding band) Make-up removed NA Hair care products removed Battery operated devices (external) removed Heating patches and chemical warmers removed Titanium eyewear removed Nail polish cured greater than 10 hours NA Casting material cured greater than 10 hours NA Hearing aids removed NA Loose dentures or partials removed NA Prosthetics  have been removed NA Patient demonstrates correct use of air break device (if applicable) Patient concerns have been addressed Patient grounding bracelet on and cord attached to chamber Specifics for Inpatients (complete in addition to above) Medication sheet sent with patient NA Intravenous medications needed or due during  therapy sent with patient NA Drainage tubes (e.g. nasogastric tube or chest tube secured and vented) NA Endotracheal or Tracheotomy tube secured NA Cuff deflated of air and inflated with saline NA Airway suctioned NA Notes Paper version used prior to treatment. Electronic Signature(s) Signed: 06/10/2022 11:42:22 AM By: [name] CHT EMT BS , , Entered By: [name] on 06/10/2022 11:42:22

## 2022-06-11 ENCOUNTER — Encounter (HOSPITAL_BASED_OUTPATIENT_CLINIC_OR_DEPARTMENT_OTHER): Payer: BC Managed Care – PPO | Admitting: General Surgery

## 2022-06-11 NOTE — Progress Notes (Signed)
Jimmy [name], Jimmy [name] ([id number]) Visit Report for 06/09/2022 Problem List Details Patient Name: Date of Service: [name] 06/09/2022 10:00 A M Medical Record Number: [id number] Patient Account Number: [id number] Date of Birth/Sex: Treating RN: Jul 15, 1966 (56 y.o. 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 06/09/2022 Medical Record Number: [id number] Patient Account Number: [id number] Date of Birth/Sex: Treating RN: May 19, 1966 (56 y.o. 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, unspecified Z21 Asymptomatic human immunodeficiency virus [HIV] infection status K26.0 Acute duodenal ulcer with hemorrhage Facility  Procedures CPT4 Code: [id number] Description: G0277-(Facility Use Only) HBOT full body chamber, , ICD-10 Diagnosis Description M86.68 Other chronic osteomyelitis, other site Z87.828 Personal history of other (healed) physical injury and trauma 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,  unspecified Z21 Asymptomatic human immunodeficiency virus [HIV] infection status Modifier: Quantity: 4 Physician Procedures : CPT4 Code Description Modifier [id number] - WC PHYS HYPERBARIC OXYGEN THERAPY ICD-10 Diagnosis Description M86.68 Other chronic osteomyelitis, other site Z87.828 Personal history of other (healed) physical injury and trauma M26.9 Dentofacial  anomaly, unspecified Z21 Asymptomatic human immunodeficiency virus [HIV] infection status Quantity: 1 Electronic Signature(s) Signed: 06/09/2022 11:36:10 AM By: [name] EMT Signed: 06/11/2022 9:33:00 AM By: Geralyn Corwin DO Entered By: [name] on 06/09/2022 11:36:10

## 2022-06-12 ENCOUNTER — Encounter (HOSPITAL_BASED_OUTPATIENT_CLINIC_OR_DEPARTMENT_OTHER): Payer: BC Managed Care – PPO | Admitting: Internal Medicine

## 2022-06-12 DIAGNOSIS — Z87828 Personal history of other (healed) physical injury and trauma: Secondary | ICD-10-CM | POA: Diagnosis not present

## 2022-06-12 DIAGNOSIS — M269 Dentofacial anomaly, unspecified: Secondary | ICD-10-CM | POA: Diagnosis not present

## 2022-06-12 DIAGNOSIS — M8668 Other chronic osteomyelitis, other site: Secondary | ICD-10-CM | POA: Diagnosis not present

## 2022-06-12 DIAGNOSIS — Z21 Asymptomatic human immunodeficiency virus [HIV] infection status: Secondary | ICD-10-CM | POA: Diagnosis not present

## 2022-06-12 NOTE — Progress Notes (Signed)
DEO, MEHRINGER ([id number]) Visit Report for 06/12/2022 HBO Safety Checklist Details Patient Name: Date of Service: [name] 06/12/2022 10:00 A M Medical Record Number: [id number] Patient Account Number: [id number] Date of Birth/Sex: Treating RN: 09-08-1966 (56 y.o. [name] Primary Care Malvin Morrish: [name] Other Clinician: [name] Referring Sandy Blouch: Treating Abe Schools/Extender: [name] in Treatment: 7 HBO Safety Checklist Items Safety Checklist Consent Form Signed Patient voided / foley secured and emptied When did you last eato 0600 Last dose of injectable or oral agent n/a Ostomy pouch emptied and vented if applicable NA All implantable devices assessed, documented and approved NA Intravenous access site secured and place NA Valuables secured Linens and cotton and cotton/polyester blend (less than 51% polyester) Personal oil-based products / skin lotions / body lotions removed Wigs or hairpieces removed NA Smoking or tobacco materials removed NA Books / newspapers / magazines / loose paper removed Cologne, aftershave, perfume and deodorant removed Jewelry removed (may wrap wedding band) Make-up removed NA Hair care products removed Battery operated devices (external) removed NA Heating patches and chemical warmers removed Titanium eyewear removed NA Nail polish cured greater than 10 hours NA Casting material cured greater than 10 hours NA Hearing aids removed NA Loose dentures or partials removed NA Prosthetics have been removed NA Patient demonstrates correct use of air break device (if applicable) Patient concerns have been addressed Patient grounding bracelet on and cord attached to chamber Specifics for Inpatients (complete in addition to above) Medication sheet sent with patient NA Intravenous medications needed or due during therapy sent with patient NA Drainage tubes (e.g.  nasogastric tube or chest tube secured and vented) NA Endotracheal or Tracheotomy tube secured NA Cuff deflated of air and inflated with saline NA Airway suctioned NA Electronic Signature(s) Signed: 06/12/2022 10:37:53 AM By: [name] CHT EMT BS , , Previous Signature: 06/12/2022 10:37:37 AM Version By: [name] CHT EMT BS , , Entered By: [name] on 06/12/2022 10:37:53

## 2022-06-12 NOTE — Progress Notes (Addendum)
Jimmy [name], Jimmy [name] ([id number]) Visit Report for 06/12/2022 Arrival Information Details Patient Name: Date of Service: Jimmy [name]. 06/12/2022 10:00 A M Medical Record Number: [id number] Patient Account Number: [id number] Date of Birth/Sex: Treating RN: [birth year] (56 y.o. [name] Primary Care Roshawn Lacina: [name] Other Clinician: [name] Referring Harriet Sutphen: Treating Nia Nathaniel/Extender: [name] in Treatment: 7 Visit Information History Since Last Visit All ordered tests and consults were completed: Yes Patient Arrived: Ambulatory Added or deleted any medications: No Arrival Time: 09:13 Any new allergies or adverse reactions: No Accompanied By: spouse Had a fall or experienced change in No Transfer Assistance: None activities of daily living that may affect Patient Identification Verified: Yes risk of falls: Secondary Verification Process Completed: Yes Signs or symptoms of abuse/neglect since last visito No Patient Requires Transmission-Based Precautions: No Hospitalized since last visit: No Patient Has Alerts: No Implantable device outside of the clinic excluding No cellular tissue based products placed in the center since last visit: Pain Present Now: No Electronic Signature(s) Signed: 06/12/2022 10:25:53 AM By: [name] CHT EMT BS , , Previous Signature: 06/12/2022 10:19:59 AM Version By: [name] CHT EMT BS , , Entered By: [name] on 06/12/2022 10:25:52 -------------------------------------------------------------------------------- Encounter Discharge Information Details Patient Name: Date of Service: Jimmy Nian [name] 06/12/2022 10:00 A M Medical Record Number: [id number] Patient Account Number: [id number] Date of Birth/Sex: Treating RN: Aug 04, 1966 (56 y.o. [name] Primary Care Rosevelt Luu: [name] Other Clinician: [name] Referring Brinnley Lacap: Treating  Skie Vitrano/Extender: [name] in Treatment: 7 Encounter Discharge Information Items Discharge Condition: Stable Ambulatory Status: Ambulatory Discharge Destination: Home Transportation: Private Auto Accompanied By: self Schedule Follow-up Appointment: No Clinical Summary of Care: Electronic Signature(s) Signed: 06/12/2022 11:22:41 AM By: [name] CHT EMT BS , , Entered By: [name] on 06/12/2022 11:22:41 -------------------------------------------------------------------------------- Vitals Details Patient Name: Date of Service: Jimmy Nian [name] 06/12/2022 10:00 A M Medical Record Number: [id number] Patient Account Number: [id number] Date of Birth/Sex: Treating RN: [birth year] (56 y.o. [name] Primary Care Lliam Hoh: [name] Other Clinician: [name] Referring Boston Catarino: Treating Kalieb Freeland/Extender: [name] in Treatment: 7 Vital Signs Time Taken: 09:21 Temperature (F): 97.2 Height (in): 71 Pulse (bpm): 85 Weight (lbs): 190 Respiratory Rate (breaths/min): 20 Body Mass Index (BMI): 26.5 Blood Pressure (mmHg): 123/77 Reference Range: 80 - 120 mg / dl Electronic Signature(s) Signed: 06/12/2022 10:25:40 AM By: [name] CHT EMT BS , , Entered By: [name] on 06/12/2022 10:25:40

## 2022-06-15 ENCOUNTER — Encounter (HOSPITAL_BASED_OUTPATIENT_CLINIC_OR_DEPARTMENT_OTHER): Payer: BC Managed Care – PPO | Admitting: General Surgery

## 2022-06-15 DIAGNOSIS — Z21 Asymptomatic human immunodeficiency virus [HIV] infection status: Secondary | ICD-10-CM | POA: Diagnosis not present

## 2022-06-15 DIAGNOSIS — M8668 Other chronic osteomyelitis, other site: Secondary | ICD-10-CM | POA: Diagnosis not present

## 2022-06-15 DIAGNOSIS — M269 Dentofacial anomaly, unspecified: Secondary | ICD-10-CM | POA: Diagnosis not present

## 2022-06-15 DIAGNOSIS — Z87828 Personal history of other (healed) physical injury and trauma: Secondary | ICD-10-CM | POA: Diagnosis not present

## 2022-06-15 NOTE — Progress Notes (Signed)
Jimmy [name], Jimmy [name] ([id number]) Visit Report for 06/15/2022 SuperBill Details Patient Name: Date of Service: [name] 06/15/2022 Medical Record Number: [id number] Patient Account Number: [id number] Date of Birth/Sex: Treating RN: 10-09-1966 (56 y.o. 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, unspecified Z21 Asymptomatic human immunodeficiency virus [HIV] infection status K26.0 Acute duodenal ulcer with hemorrhage Facility Procedures CPT4 Code Description Modifier Quantity [id number] G0277-(Facility Use Only) HBOT full body chamber, , 4 ICD-10 Diagnosis Description M86.68 Other chronic osteomyelitis, other site Z87.828 Personal history of other (healed) physical injury and trauma 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

## 2022-06-15 NOTE — Progress Notes (Addendum)
KRISHAN, MCBREEN ([id number]) Visit Report for 06/15/2022 Arrival Information Details Patient Name: Date of Service: [name] 06/15/2022 10:00 A M Medical Record Number: [id number] Patient Account Number: [id number] Date of Birth/Sex: Treating RN: 11-02-1966 (56 y.o. [name] Primary Care Kyrstan Gotwalt: [name] Other Clinician: [name] Referring Gabbrielle Mcnicholas: Treating Aidan Caloca/Extender: [name] in Treatment: 7 Visit Information History Since Last Visit All ordered tests and consults were completed: Yes Patient Arrived: Ambulatory Added or deleted any medications: No Arrival Time: 09:44 Any new allergies or adverse reactions: No Accompanied By: spouse Had a fall or experienced change in No Transfer Assistance: None activities of daily living that may affect Patient Identification Verified: Yes risk of falls: Secondary Verification Process Completed: Yes Signs or symptoms of abuse/neglect since last visito No Patient Requires Transmission-Based Precautions: No Hospitalized since last visit: No Patient Has Alerts: No Implantable device outside of the clinic excluding No cellular tissue based products placed in the center since last visit: Pain Present Now: No Electronic Signature(s) Signed: 06/15/2022 11:06:58 AM By: [name] CHT EMT BS , , Entered By: [name] on 06/15/2022 11:06:58 -------------------------------------------------------------------------------- Encounter Discharge Information Details Patient Name: Date of Service: [name] 06/15/2022 10:00 A M Medical Record Number: [id number] Patient Account Number: [id number] Date of Birth/Sex: Treating RN: 1966/04/26 (56 y.o. [name] Primary Care Ladasia Sircy: [name] Other Clinician: [name] Referring Floreen Teegarden: Treating Serafina Topham/Extender: [name] in Treatment: 7 Encounter  Discharge Information Items Discharge Condition: Stable Ambulatory Status: Ambulatory Discharge Destination: Home Transportation: Private Auto Accompanied By: spouse Schedule Follow-up Appointment: No Clinical Summary of Care: Electronic Signature(s) Signed: 06/15/2022 1:07:39 PM By: [name] CHT EMT BS , , Previous Signature: 06/15/2022 1:02:02 PM Version By: [name] CHT EMT BS , , Entered By: [name] on 06/15/2022 13:07:38 -------------------------------------------------------------------------------- Vitals Details Patient Name: Date of Service: [name] 06/15/2022 10:00 A M Medical Record Number: [id number] Patient Account Number: [id number] Date of Birth/Sex: Treating RN: 1966/10/20 (56 y.o. [name] Primary Care Bryston Colocho: [name] Other Clinician: [name] Referring Lavinia Mcneely: Treating Taryn Shellhammer/Extender: [name] in Treatment: 7 Vital Signs Time Taken: 10:03 Temperature (F): 98.1 Height (in): 71 Pulse (bpm): 87 Weight (lbs): 190 Respiratory Rate (breaths/min): 20 Body Mass Index (BMI): 26.5 Blood Pressure (mmHg): 120/81 Reference Range: 80 - 120 mg / dl Electronic Signature(s) Signed: 06/15/2022 11:07:24 AM By: [name] CHT EMT BS , , Entered By: [name] on 06/15/2022 11:07:24

## 2022-06-15 NOTE — Progress Notes (Addendum)
Jimmy [name], Jimmy [name] ([id number]) Visit Report for 06/15/2022 HBO Details Patient Name: Date of Service: [name] 06/15/2022 10:00 A M Medical Record Number: [id number] Patient Account Number: [id number] Date of Birth/Sex: Treating RN: 27-Nov-1965 (56 y.o. 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 HBO Treatment Details Treatment Number: 27 Patient Type: Outpatient Chamber Type: Monoplace Chamber Serial #: [id number] Treatment Protocol: 2.0 ATA with 90 minutes oxygen, with two 5 minute air breaks Treatment Details Compression Rate Down: 2.0 psi / minute De-Compression Rate Up: 2.0 psi / minute A breaks and breathing ir Compress Tx Pressure periods Decompress Decompress Begins Reached (leave unused spaces Begins Ends blank) Chamber Pressure (ATA 1 2 2 2 2 2  --2 1 ) Clock Time (24 hr) 10:07 10:16 10:46 10:51 11:21 11:26 - - 11:56 12:05 Treatment Length: 118 (minutes) Treatment Segments: 4 Vital Signs Capillary Blood Glucose Reference Range: 80 - 120 mg / dl HBO Diabetic Blood Glucose Intervention Range: <131 mg/dl or mg/dl Type: Time Vitals Blood Respiratory Capillary Blood Glucose Pulse Action Pulse: Temperature: Taken: Pressure: Rate: Glucose (mg/dl): Meter #: Oximetry (%) Taken: Pre 10:03 120/81 87 20 98.1 none per protocol Post 12:08 134/79 73 18 98 none per protocol Treatment Response Treatment Toleration: Well Treatment Completion Status: Treatment Completed without Adverse Event Physician HBO Attestation: I certify that I supervised this HBO treatment in accordance with  Medicare guidelines. A trained emergency response team is readily available per Yes hospital policies and procedures. Continue HBOT as ordered. Yes Electronic Signature(s) Signed: 06/15/2022 1:48:20 PM By: 06/17/2022 MD FACS Previous Signature: 06/15/2022 1:00:37 PM Version By: 06/17/2022 CHT EMT BS , , Entered By: [name] on 06/15/2022 13:48:20 -------------------------------------------------------------------------------- HBO Safety Checklist Details Patient Name: Date of Service: 06/17/2022 [name] 06/15/2022 10:00 A M Medical Record Number: 06/17/2022 Patient Account Number: [id number] Date of Birth/Sex: Treating RN: [birth year] (56 y.o. 53 Primary Care Marysue Fait: [name] Other Clinician: [name] Referring Candita Borenstein: Treating Jonahtan Manseau/Extender: [name] in Treatment: 7 HBO Safety Checklist Items Safety Checklist Consent Form Signed Patient voided / foley secured and emptied When did you last eato 0700 Last dose of injectable or oral agent n/a Ostomy pouch emptied and vented if applicable NA All implantable devices assessed, documented and approved NA Intravenous access site secured and place NA Valuables secured Linens and cotton and cotton/polyester blend (less than 51% polyester) Personal oil-based products / skin lotions / body lotions removed Wigs or hairpieces removed NA Smoking or tobacco materials removed NA Books / newspapers / magazines / loose paper removed Cologne, aftershave, perfume and deodorant removed Jewelry removed (may wrap wedding band) Make-up removed NA Hair care products removed Battery operated devices (external) removed Heating patches and chemical warmers removed Titanium eyewear removed NA Nail polish cured greater than 10 hours NA Casting material cured greater than 10 hours NA Hearing aids removed NA Loose dentures or partials  removed NA Prosthetics have been removed NA Patient demonstrates correct use of air break device (if applicable) Patient concerns have been addressed Patient grounding bracelet on and cord attached to chamber Specifics for Inpatients (complete in addition to above) Medication sheet sent with patient NA Intravenous medications needed or due  during therapy sent with patient NA Drainage tubes (e.g. nasogastric tube or chest tube secured and vented) NA Endotracheal or Tracheotomy tube secured NA Cuff deflated of air and inflated with saline NA Airway suctioned NA Notes Paper version used prior to treatment. Electronic Signature(s) Signed: 06/15/2022 11:13:59 AM By: [name] CHT EMT BS , , Entered By: [name] on 06/15/2022 11:13:59

## 2022-06-15 NOTE — Progress Notes (Signed)
Jimmy [name], Jimmy [name] ([id number]) Visit Report for 06/12/2022 SuperBill Details Patient Name: Date of Service: [name] 06/12/2022 Medical Record Number: [id number] Patient Account Number: [id number] Date of Birth/Sex: Treating RN: 27-Sep-1966 (56 y.o. 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, unspecified Z21 Asymptomatic human immunodeficiency virus [HIV] infection status K26.0 Acute duodenal ulcer with hemorrhage Facility Procedures CPT4 Code Description Modifier Quantity [id number] G0277-(Facility Use Only) HBOT full body chamber, , 4 ICD-10 Diagnosis Description M86.68 Other chronic osteomyelitis, other site Z87.828 Personal history of other (healed) physical injury and trauma 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

## 2022-06-16 ENCOUNTER — Encounter (HOSPITAL_BASED_OUTPATIENT_CLINIC_OR_DEPARTMENT_OTHER): Payer: BC Managed Care – PPO | Attending: General Surgery | Admitting: General Surgery

## 2022-06-16 DIAGNOSIS — Z87828 Personal history of other (healed) physical injury and trauma: Secondary | ICD-10-CM | POA: Diagnosis not present

## 2022-06-16 DIAGNOSIS — M8668 Other chronic osteomyelitis, other site: Secondary | ICD-10-CM | POA: Insufficient documentation

## 2022-06-16 DIAGNOSIS — M269 Dentofacial anomaly, unspecified: Secondary | ICD-10-CM | POA: Insufficient documentation

## 2022-06-16 DIAGNOSIS — Z21 Asymptomatic human immunodeficiency virus [HIV] infection status: Secondary | ICD-10-CM | POA: Diagnosis not present

## 2022-06-16 DIAGNOSIS — K260 Acute duodenal ulcer with hemorrhage: Secondary | ICD-10-CM | POA: Insufficient documentation

## 2022-06-16 NOTE — Progress Notes (Addendum)
COURAGE, BIGLOW ([id number]) Visit Report for 06/16/2022 HBO Details Patient Name: Date of Service: [name] 06/16/2022 10:00 A M Medical Record Number: [id number] Patient Account Number: [id number] Date of Birth/Sex: Treating RN: 02-Feb-1966 (56 y.o. 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 HBO Treatment Details Treatment Number: 28 Patient Type: Outpatient Chamber Type: Monoplace Chamber Serial #: [id number] Treatment Protocol: 2.0 ATA with 90 minutes oxygen, with two 5 minute air breaks Treatment Details Compression Rate Down: 2.0 psi / minute De-Compression Rate Up: 2.0 psi / minute A breaks and breathing ir Compress Tx Pressure periods Decompress Decompress Begins Reached (leave unused spaces Begins Ends blank) Chamber Pressure (ATA 1 2 2 2 2 2  --2 1 ) Clock Time (24 hr) 09:44 09:54 10:24 10:30 11:00 11:05 - - 11:35 11:42 Treatment Length: 118 (minutes) Treatment Segments: 4 Vital Signs Capillary Blood Glucose Reference Range: 80 - 120 mg / dl HBO Diabetic Blood Glucose Intervention Range: <131 mg/dl or mg/dl Time Vitals Blood Respiratory Capillary Blood Glucose Pulse Action Type: Pulse: Temperature: Taken: Pressure: Rate: Glucose (mg/dl): Meter #: Oximetry (%) Taken: Pre 09:40 120/72 83 16 98 Post 11:43 139/73 76 16 98.3 Treatment Response Treatment Toleration: Well Treatment Completion Status: Treatment Completed without Adverse Event Physician HBO Attestation: I certify that I supervised this HBO treatment in accordance with Medicare guidelines. A trained emergency response team is  readily available per Yes hospital policies and procedures. Continue HBOT as ordered. Yes Electronic Signature(s) Signed: 06/16/2022 1:28:46 PM By: 08/16/2022 MD FACS Previous Signature: 06/16/2022 1:14:31 PM Version By: 08/16/2022 MD FACS Previous Signature: 06/16/2022 12:44:17 PM Version By: 08/16/2022 EMT Entered By: [name] on 06/16/2022 13:28:46 -------------------------------------------------------------------------------- HBO Safety Checklist Details Patient Name: Date of Service: 08/16/2022 [name] 06/16/2022 10:00 A M Medical Record Number: 08/16/2022 Patient Account Number: [id number] Date of Birth/Sex: Treating RN: 18-Jul-1966 (56 y.o. 54, Jimmy [name] Primary Care Saralee Bolick: [name] Other Clinician: [name] Referring Nance Mccombs: Treating Makana Feigel/Extender: [name] in Treatment: 7 HBO Safety Checklist Items Safety Checklist Consent Form Signed Patient voided / foley secured and emptied When did you last eato 0915 Last dose of injectable or oral agent NA Ostomy pouch emptied and vented if applicable NA All implantable devices assessed, documented and approved NA Intravenous access site secured and place NA Valuables secured Linens and cotton and cotton/polyester blend (less than 51% polyester) Personal oil-based products / skin lotions / body lotions removed Wigs or hairpieces removed NA Smoking or tobacco materials removed Books / newspapers / magazines / loose paper removed Cologne, aftershave, perfume and deodorant removed Jewelry removed (may wrap wedding band) NA Make-up removed NA Hair care products removed Battery operated devices (external) removed Heating patches and chemical warmers removed Titanium eyewear removed NA Nail polish cured greater than 10 hours Casting material cured greater than 10 hours NA Hearing aids removed NA Loose dentures or partials removed NA Prosthetics have  been removed NA Patient demonstrates correct use of air break device (if applicable) Patient concerns have been addressed Patient grounding bracelet on and cord attached to chamber Specifics for Inpatients (complete in addition to above) Medication sheet sent with patient NA Intravenous medications needed or due  during therapy sent with patient NA Drainage tubes (e.g. nasogastric tube or chest tube secured and vented) NA Endotracheal or Tracheotomy tube secured NA Cuff deflated of air and inflated with saline NA Airway suctioned NA Notes the safety checklist was done before the treatment was started. Electronic Signature(s) Signed: 06/16/2022 12:42:08 PM By: [name] EMT Entered By: [name] on 06/16/2022 12:42:08

## 2022-06-16 NOTE — Progress Notes (Signed)
Jimmy [name], REIMERS ([id number]) Visit Report for 06/16/2022 Problem List Details Patient Name: Date of Service: [name] 06/16/2022 10:00 A M Medical Record Number: [id number] Patient Account Number: [id number] Date of Birth/Sex: Treating RN: [birth year] (56 y.o. 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 06/16/2022 Medical Record Number: [id number] Patient Account Number: [id number] Date of Birth/Sex: Treating RN: 01-30-1966 (56 y.o. 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, unspecified Z21 Asymptomatic human immunodeficiency virus [HIV] infection status K26.0 Acute duodenal ulcer with hemorrhage Facility  Procedures CPT4 Code: [id number] Description: G0277-(Facility Use Only) HBOT full body chamber, , ICD-10 Diagnosis Description M86.68 Other chronic osteomyelitis, other site Z87.828 Personal history of other (healed) physical injury and trauma 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,  unspecified Z21 Asymptomatic human immunodeficiency virus [HIV] infection status Modifier: Quantity: 4 Physician Procedures : CPT4 Code Description Modifier [id number] - WC PHYS HYPERBARIC OXYGEN THERAPY ICD-10 Diagnosis Description M86.68 Other chronic osteomyelitis, other site Z87.828 Personal history of other (healed) physical injury and trauma M26.9 Dentofacial  anomaly, unspecified Z21 Asymptomatic human immunodeficiency virus [HIV] infection status Quantity: 1 Electronic Signature(s) Signed: 06/16/2022 12:44:43 PM By: [name] EMT Signed: 06/16/2022 1:14:05 PM By: [name] Entered By: [name] on 06/16/2022 12:44:43

## 2022-06-16 NOTE — Progress Notes (Addendum)
XZANDER, GILHAM ([id number]) Visit Report for 06/16/2022 Arrival Information Details Patient Name: Date of Service: [name] 06/16/2022 10:00 A M Medical Record Number: [id number] Patient Account Number: [id number] Date of Birth/Sex: Treating RN: 01-18-1966 (56 y.o. [name], [name] Primary Care Chai Routh: [name] Other Clinician: [name] Referring Shir Bergman: Treating Della Scrivener/Extender: [name] in Treatment: 7 Visit Information History Since Last Visit All ordered tests and consults were completed: Yes Patient Arrived: Ambulatory Added or deleted any medications: No Arrival Time: 09:37 Any new allergies or adverse reactions: No Accompanied By: Wife Had a fall or experienced change in No Transfer Assistance: None activities of daily living that may affect Patient Identification Verified: Yes risk of falls: Secondary Verification Process Completed: Yes Signs or symptoms of abuse/neglect since last visito No Patient Requires Transmission-Based Precautions: No Hospitalized since last visit: No Patient Has Alerts: No Implantable device outside of the clinic excluding No cellular tissue based products placed in the center since last visit: Pain Present Now: No Electronic Signature(s) Signed: 06/16/2022 12:30:43 PM By: [name] EMT Entered By: [name] on 06/16/2022 12:30:43 -------------------------------------------------------------------------------- Encounter Discharge Information Details Patient Name: Date of Service: [name] 06/16/2022 10:00 A M Medical Record Number: [id number] Patient Account Number: [id number] Date of Birth/Sex: Treating RN: Jan 08, 1966 (56 y.o. [name] Primary Care Adisa Litt: [name] Other Clinician: [name] Referring Tuwanna Krausz: Treating Robertson Colclough/Extender: [name] in Treatment: 7 Encounter Discharge Information Items Discharge  Condition: Stable Ambulatory Status: Ambulatory Discharge Destination: Home Transportation: Private Auto Accompanied By: Wife Schedule Follow-up Appointment: Yes Clinical Summary of Care: Electronic Signature(s) Signed: 06/16/2022 12:45:19 PM By: [name] EMT Entered By: [name] on 06/16/2022 12:45:19 -------------------------------------------------------------------------------- Vitals Details Patient Name: Date of Service: [name] 06/16/2022 10:00 A M Medical Record Number: [id number] Patient Account Number: [id number] Date of Birth/Sex: Treating RN: December 18, 1965 (56 y.o. [name], [name] Primary Care Ronnika Collett: [name] Other Clinician: [name] Referring Derik Fults: Treating Yareliz Thorstenson/Extender: [name] in Treatment: 7 Vital Signs Time Taken: 09:40 Temperature (F): 98.0 Height (in): 71 Pulse (bpm): 83 Weight (lbs): 190 Respiratory Rate (breaths/min): 16 Body Mass Index (BMI): 26.5 Blood Pressure (mmHg): 120/72 Reference Range: 80 - 120 mg / dl Electronic Signature(s) Signed: 06/16/2022 12:31:10 PM By: [name] EMT Entered By: [name] on 06/16/2022 12:31:09

## 2022-06-17 ENCOUNTER — Encounter (HOSPITAL_BASED_OUTPATIENT_CLINIC_OR_DEPARTMENT_OTHER): Payer: BC Managed Care – PPO | Admitting: Physician Assistant

## 2022-06-17 DIAGNOSIS — M269 Dentofacial anomaly, unspecified: Secondary | ICD-10-CM | POA: Diagnosis not present

## 2022-06-17 DIAGNOSIS — Z21 Asymptomatic human immunodeficiency virus [HIV] infection status: Secondary | ICD-10-CM | POA: Diagnosis not present

## 2022-06-17 DIAGNOSIS — K260 Acute duodenal ulcer with hemorrhage: Secondary | ICD-10-CM | POA: Diagnosis not present

## 2022-06-17 DIAGNOSIS — M8668 Other chronic osteomyelitis, other site: Secondary | ICD-10-CM | POA: Diagnosis not present

## 2022-06-17 DIAGNOSIS — Z87828 Personal history of other (healed) physical injury and trauma: Secondary | ICD-10-CM | POA: Diagnosis not present

## 2022-06-17 NOTE — Progress Notes (Addendum)
Jimmy [name], Jimmy [name] ([id number]) Visit Report for 06/17/2022 HBO Details Patient Name: Date of Service: [name] 06/17/2022 10:00 A M Medical Record Number: [id number] Patient Account Number: [id number] Date of Birth/Sex: Treating RN: [birth year] (56 y.o. 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 HBO Treatment Details Treatment Number: 29 Patient Type: Outpatient Chamber Type: Monoplace Chamber Serial #: [id number] Treatment Protocol: 2.0 ATA with 90 minutes oxygen, with two 5 minute air breaks Treatment Details Compression Rate Down: 2.0 psi / minute De-Compression Rate Up: 2.0 psi / minute A breaks and breathing ir Compress Tx Pressure periods Decompress Decompress Begins Reached (leave unused spaces Begins Ends blank) Chamber Pressure (ATA 1 2 2 2 2 2  --2 1 ) Clock Time (24 hr) 09:37 09:46 10:16 10:22 10:52 10:57 - - 11:27 11:35 Treatment Length: 118 (minutes) Treatment Segments: 4 Vital Signs Capillary Blood Glucose Reference Range: 80 - 120 mg / dl HBO Diabetic Blood Glucose Intervention Range: <131 mg/dl or mg/dl Type: Time Vitals Blood Respiratory Capillary Blood Glucose Pulse Action Pulse: Temperature: Taken: Pressure: Rate: Glucose (mg/dl): Meter #: Oximetry (%) Taken: Pre 11:38 125/80 84 16 98.1 none per protocol Post 11:38 103/71 75 16 97.2 none per protocol Treatment Response Treatment Toleration: Well Treatment Completion Status: Treatment Completed without Adverse Event Electronic Signature(s) Signed: 06/17/2022 12:27:24 PM By: 08/17/2022 CHT EMT BS , , Signed: 06/17/2022  5:04:53 PM By: 08/17/2022 PA-C Entered By: Lenda Kelp on 06/17/2022 12:27:23 -------------------------------------------------------------------------------- HBO Safety Checklist Details Patient Name: Date of Service: 08/17/2022 [name] 06/17/2022 10:00 A M Medical Record Number: 08/17/2022 Patient Account Number: [id number] Date of Birth/Sex: Treating RN: [birth year] (56 y.o. 54 Primary Care Yer Castello: [name] Other Clinician: [name] Referring Tatiyana Foucher: Treating Eleny Cortez/Extender: [name] in Treatment: 7 HBO Safety Checklist Items Safety Checklist Consent Form Signed Patient voided / foley secured and emptied When did you last eato 0830 Last dose of injectable or oral agent n/a Ostomy pouch emptied and vented if applicable NA All implantable devices assessed, documented and approved NA Intravenous access site secured and place NA Valuables secured Linens and cotton and cotton/polyester blend (less than 51% polyester) Personal oil-based products / skin lotions / body lotions removed NA Wigs or hairpieces removed NA Smoking or tobacco materials removed Books / newspapers / magazines / loose paper removed Cologne, aftershave, perfume and deodorant removed Jewelry removed (may wrap wedding band) Make-up removed NA Hair care products removed Battery operated devices (external) removed Heating patches and chemical warmers removed Titanium eyewear removed NA Nail polish cured greater than 10 hours NA Casting material cured greater than 10 hours NA Hearing aids removed NA Loose dentures or partials removed NA Prosthetics have been removed NA Patient demonstrates correct use of air break device (if applicable) Patient concerns have been addressed Patient grounding bracelet on and cord attached to chamber Specifics for Inpatients (complete in addition to above) Medication sheet sent with  patient NA Intravenous medications needed or due during therapy sent with patient NA Drainage tubes (e.g. nasogastric tube or chest tube secured and vented) NA Endotracheal or Tracheotomy tube secured NA Cuff deflated of air and inflated with saline NA Airway suctioned  NA Notes Paper version used prior to treatment. Electronic Signature(s) Signed: 06/17/2022 12:26:01 PM By: [name] CHT EMT BS , , Entered By: [name] on 06/17/2022 12:26:01

## 2022-06-17 NOTE — Progress Notes (Signed)
Jimmy [name], Jimmy [name] ([id number]) Visit Report for 06/17/2022 SuperBill Details Patient Name: Date of Service: Jimmy [name] 06/17/2022 Medical Record Number: [id number] Patient Account Number: [id number] Date of Birth/Sex: Treating RN: 10-08-1966 (56 y.o. 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, unspecified Z21 Asymptomatic human immunodeficiency virus [HIV] infection status K26.0 Acute duodenal ulcer with hemorrhage Facility Procedures CPT4 Code Description Modifier Quantity [id number] G0277-(Facility Use Only) HBOT full body chamber, , 4 ICD-10 Diagnosis Description M86.68 Other chronic osteomyelitis, other site Z87.828 Personal history of other (healed) physical injury and trauma 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

## 2022-06-17 NOTE — Progress Notes (Addendum)
DOMANI, BAKOS ([id number]) Visit Report for 06/17/2022 Arrival Information Details Patient Name: Date of Service: [name] 06/17/2022 10:00 A M Medical Record Number: [id number] Patient Account Number: [id number] Date of Birth/Sex: Treating RN: Apr 29, 1966 (56 y.o. [name] Primary Care Cheyrl Buley: [name] Other Clinician: [name] Referring Jewelia Bocchino: Treating Lariya Kinzie/Extender: Lenon Curt in Treatment: 7 Visit Information History Since Last Visit All ordered tests and consults were completed: Yes Patient Arrived: Ambulatory Added or deleted any medications: No Arrival Time: 09:23 Any new allergies or adverse reactions: No Accompanied By: spouse Had a fall or experienced change in No Transfer Assistance: None activities of daily living that may affect Patient Identification Verified: Yes risk of falls: Secondary Verification Process Completed: Yes Signs or symptoms of abuse/neglect since last visito No Patient Requires Transmission-Based Precautions: No Hospitalized since last visit: No Patient Has Alerts: No Implantable device outside of the clinic excluding No cellular tissue based products placed in the center since last visit: Pain Present Now: No Electronic Signature(s) Signed: 06/17/2022 12:15:33 PM By: [name] CHT EMT BS , , Entered By: [name] on 06/17/2022 12:15:33 -------------------------------------------------------------------------------- Encounter Discharge Information Details Patient Name: Date of Service: [name] 06/17/2022 10:00 A M Medical Record Number: [id number] Patient Account Number: [id number] Date of Birth/Sex: Treating RN: July 22, 1966 (56 y.o. [name] Primary Care Aeron Lheureux: [name] Other Clinician: [name] Referring Katiya Fike: Treating Myliyah Rebuck/Extender: Lenon Curt in Treatment: 7 Encounter  Discharge Information Items Discharge Condition: Stable Ambulatory Status: Ambulatory Discharge Destination: Home Transportation: Private Auto Accompanied By: spouse Schedule Follow-up Appointment: No Clinical Summary of Care: Electronic Signature(s) Signed: 06/17/2022 1:09:58 PM By: [name] CHT EMT BS , , Entered By: [name] on 06/17/2022 13:09:58 -------------------------------------------------------------------------------- Vitals Details Patient Name: Date of Service: [name] 06/17/2022 10:00 A M Medical Record Number: [id number] Patient Account Number: [id number] Date of Birth/Sex: Treating RN: 1966-10-04 (56 y.o. [name] Primary Care Jeanelle Dake: [name] Other Clinician: [name] Referring Lun Muro: Treating Johnluke Haugen/Extender: Lenon Curt in Treatment: 7 Vital Signs Time Taken: 11:38 Temperature (F): 97.2 Height (in): 71 Pulse (bpm): 75 Weight (lbs): 190 Respiratory Rate (breaths/min): 16 Body Mass Index (BMI): 26.5 Blood Pressure (mmHg): 103/71 Reference Range: 80 - 120 mg / dl Electronic Signature(s) Signed: 06/17/2022 12:24:47 PM By: [name] CHT EMT BS , , Entered By: [name] on 06/17/2022 12:24:47

## 2022-06-18 ENCOUNTER — Encounter (HOSPITAL_BASED_OUTPATIENT_CLINIC_OR_DEPARTMENT_OTHER): Payer: BC Managed Care – PPO | Admitting: Internal Medicine

## 2022-06-18 DIAGNOSIS — M8668 Other chronic osteomyelitis, other site: Secondary | ICD-10-CM | POA: Diagnosis not present

## 2022-06-18 DIAGNOSIS — Z21 Asymptomatic human immunodeficiency virus [HIV] infection status: Secondary | ICD-10-CM | POA: Diagnosis not present

## 2022-06-18 DIAGNOSIS — M269 Dentofacial anomaly, unspecified: Secondary | ICD-10-CM

## 2022-06-18 DIAGNOSIS — Z87828 Personal history of other (healed) physical injury and trauma: Secondary | ICD-10-CM

## 2022-06-18 DIAGNOSIS — K260 Acute duodenal ulcer with hemorrhage: Secondary | ICD-10-CM | POA: Diagnosis not present

## 2022-06-18 NOTE — Progress Notes (Signed)
ZAIDEN, LUDLUM ([id number]) Visit Report for 06/18/2022 Problem List Details Patient Name: Date of Service: [name] 06/18/2022 10:00 A M Medical Record Number: [id number] Patient Account Number: [id number] Date of Birth/Sex: Treating RN: [birth year] (56 y.o. 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 06/18/2022 Medical Record Number: [id number] Patient Account Number: [id number] Date of Birth/Sex: Treating RN: 1966-02-03 (56 y.o. 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, unspecified Z21 Asymptomatic human immunodeficiency virus [HIV] infection status K26.0 Acute duodenal ulcer with hemorrhage Facility  Procedures CPT4 Code: [id number] Description: G0277-(Facility Use Only) HBOT full body chamber, , ICD-10 Diagnosis Description M86.68 Other chronic osteomyelitis, other site Z87.828 Personal history of other (healed) physical injury and trauma 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,  unspecified Z21 Asymptomatic human immunodeficiency virus [HIV] infection status Modifier: Quantity: 4 Physician Procedures : CPT4 Code Description Modifier [id number] - WC PHYS HYPERBARIC OXYGEN THERAPY ICD-10 Diagnosis Description M86.68 Other chronic osteomyelitis, other site Z87.828 Personal history of other (healed) physical injury and trauma M26.9 Dentofacial  anomaly, unspecified Z21 Asymptomatic human immunodeficiency virus [HIV] infection status Quantity: 1 Electronic Signature(s) Signed: 06/18/2022 12:27:49 PM By: [name] EMT Signed: 06/18/2022 3:55:06 PM By: Geralyn Corwin DO Entered By: [name] on 06/18/2022 12:27:49

## 2022-06-18 NOTE — Progress Notes (Addendum)
Jimmy [name], Jimmy [name] ([id number]) Visit Report for 06/18/2022 Arrival Information Details Patient Name: Date of Service: Jimmy [name]. 06/18/2022 10:00 A M Medical Record Number: [id number] Patient Account Number: [id number] Date of Birth/Sex: Treating RN: [birth year] (56 y.o. [name] Primary Care Oracio Galen: [name] Other Clinician: [name] Referring Franziska Podgurski: Treating Jamarie Joplin/Extender: [name] in Treatment: 7 Visit Information History Since Last Visit All ordered tests and consults were completed: Yes Patient Arrived: Ambulatory Added or deleted any medications: No Arrival Time: 09:27 Any new allergies or adverse reactions: No Accompanied By: Wife Had a fall or experienced change in No Transfer Assistance: None activities of daily living that may affect Patient Identification Verified: Yes risk of falls: Secondary Verification Process Completed: Yes Signs or symptoms of abuse/neglect since last visito No Patient Requires Transmission-Based Precautions: No Hospitalized since last visit: No Patient Has Alerts: No Implantable device outside of the clinic excluding No cellular tissue based products placed in the center since last visit: Pain Present Now: No Electronic Signature(s) Signed: 06/18/2022 11:47:37 AM By: [name] EMT Entered By: [name] on 06/18/2022 11:47:37 -------------------------------------------------------------------------------- Encounter Discharge Information Details Patient Name: Date of Service: Jimmy [name] 06/18/2022 10:00 A M Medical Record Number: [id number] Patient Account Number: [id number] Date of Birth/Sex: Treating RN: October 20, 1966 (56 y.o. [name] Primary Care Jimmy [name]: [name] Other Clinician: [name] Referring Rooney Gladwin: Treating Iliana Hutt/Extender: [name] in Treatment: 7 Encounter Discharge Information Items Discharge  Condition: Stable Ambulatory Status: Ambulatory Discharge Destination: Home Transportation: Private Auto Accompanied By: None Schedule Follow-up Appointment: Yes Clinical Summary of Care: Electronic Signature(s) Signed: 06/18/2022 12:28:30 PM By: [name] EMT Entered By: [name] on 06/18/2022 12:28:30 -------------------------------------------------------------------------------- Vitals Details Patient Name: Date of Service: Jimmy [name] 06/18/2022 10:00 A M Medical Record Number: [id number] Patient Account Number: [id number] Date of Birth/Sex: Treating RN: 24-Aug-1966 (56 y.o. [name] Primary Care Krishauna Schatzman: [name] Other Clinician: [name] Referring Jeret Goyer: Treating Augustin Bun/Extender: [name] in Treatment: 7 Vital Signs Time Taken: 09:30 Temperature (F): 97.0 Height (in): 71 Pulse (bpm): 79 Weight (lbs): 190 Respiratory Rate (breaths/min): 16 Body Mass Index (BMI): 26.5 Blood Pressure (mmHg): 116/83 Reference Range: 80 - 120 mg / dl Electronic Signature(s) Signed: 06/18/2022 11:48:05 AM By: [name] EMT Entered By: [name] on 06/18/2022 11:48:05

## 2022-06-18 NOTE — Progress Notes (Addendum)
Jimmy [name], DOLINGER ([id number]) Visit Report for 06/18/2022 HBO Details Patient Name: Date of Service: Jimmy [name]. 06/18/2022 10:00 A M Medical Record Number: [id number] Patient Account Number: [id number] Date of Birth/Sex: Treating RN: May 08, 1966 (56 y.o. 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 HBO Treatment Details Treatment Number: 30 Patient Type: Outpatient Chamber Type: Monoplace Chamber Serial #: [id number] Treatment Protocol: 2.0 ATA with 90 minutes oxygen, with two 5 minute air breaks Treatment Details Compression Rate Down: 2.0 psi / minute De-Compression Rate Up: 2.0 psi / minute A breaks and breathing ir Compress Tx Pressure periods Decompress Decompress Begins Reached (leave unused spaces Begins Ends blank) Chamber Pressure (ATA 1 2 2 2 2 2  --2 1 ) Clock Time (24 hr) 09:34 09:44 10:13 10:18 10:49 10:49 - - 10:24 11:31 Treatment Length: 117 (minutes) Treatment Segments: 4 Vital Signs Capillary Blood Glucose Reference Range: 80 - 120 mg / dl HBO Diabetic Blood Glucose Intervention Range: <131 mg/dl or mg/dl Time Vitals Blood Respiratory Capillary Blood Glucose Pulse Action Type: Pulse: Temperature: Taken: Pressure: Rate: Glucose (mg/dl): Meter #: Oximetry (%) Taken: Pre 09:30 116/83 79 16 97 Post 11:34 119/81 69 18 97 Treatment Response Treatment Toleration: Well Treatment Completion Status: Treatment Completed without Adverse Event Physician HBO Attestation: I certify that I supervised this HBO treatment in accordance with Medicare guidelines. A trained emergency response team is  readily available per Yes hospital policies and procedures. Continue HBOT as ordered. Yes Electronic Signature(s) Signed: 06/18/2022 3:55:06 PM By: 08/18/2022 DO Previous Signature: 06/18/2022 12:27:12 PM Version By: 08/18/2022 EMT Entered By: [name] on 06/18/2022 15:53:13 -------------------------------------------------------------------------------- HBO Safety Checklist Details Patient Name: Date of Service: 08/18/2022 [name] 06/18/2022 10:00 A M Medical Record Number: 08/18/2022 Patient Account Number: [id number] Date of Birth/Sex: Treating RN: [birth year] (56 y.o. 53 Primary Care Debbie Bellucci: Jimmy [name] Other Clinician: [name] Referring Jimmy [name]: Treating Alekhya Gravlin/Extender: [name] in Treatment: 7 HBO Safety Checklist Items Safety Checklist Consent Form Signed Patient voided / foley secured and emptied When did you last eato 0700 Last dose of injectable or oral agent NA Ostomy pouch emptied and vented if applicable NA All implantable devices assessed, documented and approved NA Intravenous access site secured and place NA Valuables secured Linens and cotton and cotton/polyester blend (less than 51% polyester) Personal oil-based products / skin lotions / body lotions removed Wigs or hairpieces removed NA Smoking or tobacco materials removed Books / newspapers / magazines / loose paper removed Cologne, aftershave, perfume and deodorant removed Jewelry removed (may wrap wedding band) NA Make-up removed NA Hair care products removed Battery operated devices (external) removed Heating patches and chemical warmers removed Titanium eyewear removed NA Nail polish cured greater than 10 hours NA Casting material cured greater than 10 hours NA Hearing aids removed NA Loose dentures or partials removed NA Prosthetics have been removed NA Patient demonstrates correct use of air break device (if  applicable) Patient concerns have been addressed Patient grounding bracelet on and cord attached to chamber Specifics for Inpatients (complete in addition to above) Medication sheet sent with patient NA Intravenous medications needed or due during therapy sent with patient NA Drainage tubes (e.g. nasogastric tube  or chest tube secured and vented) NA Endotracheal or Tracheotomy tube secured NA Cuff deflated of air and inflated with saline NA Airway suctioned NA Notes The safety checklist was done before the treatment was started. Electronic Signature(s) Signed: 06/18/2022 11:50:46 AM By: [name] EMT Entered By: [name] on 06/18/2022 11:50:45

## 2022-06-19 ENCOUNTER — Encounter (HOSPITAL_BASED_OUTPATIENT_CLINIC_OR_DEPARTMENT_OTHER): Payer: BC Managed Care – PPO | Admitting: Internal Medicine

## 2022-06-19 DIAGNOSIS — M8668 Other chronic osteomyelitis, other site: Secondary | ICD-10-CM

## 2022-06-19 DIAGNOSIS — Z21 Asymptomatic human immunodeficiency virus [HIV] infection status: Secondary | ICD-10-CM | POA: Diagnosis not present

## 2022-06-19 DIAGNOSIS — Z87828 Personal history of other (healed) physical injury and trauma: Secondary | ICD-10-CM | POA: Diagnosis not present

## 2022-06-19 DIAGNOSIS — M269 Dentofacial anomaly, unspecified: Secondary | ICD-10-CM

## 2022-06-19 DIAGNOSIS — K260 Acute duodenal ulcer with hemorrhage: Secondary | ICD-10-CM | POA: Diagnosis not present

## 2022-06-19 NOTE — Progress Notes (Addendum)
JAIDEV, SANGER ([id number]) Visit Report for 06/19/2022 Arrival Information Details Patient Name: Date of Service: [name] 06/19/2022 10:00 A M Medical Record Number: [id number] Patient Account Number: [id number] Date of Birth/Sex: Treating RN: February 10, 1966 (56 y.o. [name], [name] Primary Care Sanyla Summey: [name] Other Clinician: [name] Referring Ronn Smolinsky: Treating Sarann Tregre/Extender: [name] in Treatment: 8 Visit Information History Since Last Visit All ordered tests and consults were completed: Yes Patient Arrived: Ambulatory Added or deleted any medications: No Arrival Time: 09:22 Any new allergies or adverse reactions: No Accompanied By: self Had a fall or experienced change in No Transfer Assistance: None activities of daily living that may affect Patient Identification Verified: Yes risk of falls: Secondary Verification Process Completed: Yes Signs or symptoms of abuse/neglect since last visito No Patient Requires Transmission-Based Precautions: No Hospitalized since last visit: No Patient Has Alerts: No Implantable device outside of the clinic excluding No cellular tissue based products placed in the center since last visit: Pain Present Now: No Electronic Signature(s) Signed: 06/19/2022 9:54:10 AM By: [name] CHT EMT BS , , Entered By: [name] on 06/19/2022 09:54:10 -------------------------------------------------------------------------------- Encounter Discharge Information Details Patient Name: Date of Service: [name] 06/19/2022 10:00 A M Medical Record Number: [id number] Patient Account Number: [id number] Date of Birth/Sex: Treating RN: [birth year] (56 y.o. [name] Primary Care Symphonie Schneiderman: [name] Other Clinician: [name] Referring Deyci Gesell: Treating Travious Vanover/Extender: [name] in Treatment: 8 Encounter Discharge Information  Items Discharge Condition: Stable Ambulatory Status: Ambulatory Discharge Destination: Home Transportation: Private Auto Accompanied By: spouse Schedule Follow-up Appointment: No Clinical Summary of Care: Electronic Signature(s) Signed: 06/19/2022 11:35:15 AM By: [name] CHT EMT BS , , Entered By: [name] on 06/19/2022 11:35:15 -------------------------------------------------------------------------------- Vitals Details Patient Name: Date of Service: [name] 06/19/2022 10:00 A M Medical Record Number: [id number] Patient Account Number: [id number] Date of Birth/Sex: Treating RN: [birth year] (56 y.o. [name], [name] Primary Care Almyra Birman: [name] Other Clinician: [name] Referring Tida Saner: Treating Joquan Lotz/Extender: [name] in Treatment: 8 Vital Signs Time Taken: 09:22 Temperature (F): 97.9 Height (in): 71 Pulse (bpm): 88 Weight (lbs): 190 Respiratory Rate (breaths/min): 20 Body Mass Index (BMI): 26.5 Blood Pressure (mmHg): 121/72 Reference Range: 80 - 120 mg / dl Electronic Signature(s) Signed: 06/19/2022 9:54:38 AM By: [name] CHT EMT BS , , Entered By: [name] on 06/19/2022 09:54:37

## 2022-06-19 NOTE — Progress Notes (Addendum)
BRALYN, FOLKERT ([id number]) Visit Report for 06/19/2022 HBO Details Patient Name: Date of Service: [name] 06/19/2022 10:00 A M Medical Record Number: [id number] Patient Account Number: [id number] Date of Birth/Sex: Treating RN: 05-Apr-1966 (56 y.o. 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 HBO Treatment Details Treatment Number: 31 Patient Type: Outpatient Chamber Type: Monoplace Chamber Serial #: [id number] Treatment Protocol: 2.0 ATA with 90 minutes oxygen, with two 5 minute air breaks Treatment Details Compression Rate Down: 2.0 psi / minute De-Compression Rate Up: 2.0 psi / minute A breaks and breathing ir Compress Tx Pressure periods Decompress Decompress Begins Reached (leave unused spaces Begins Ends blank) Chamber Pressure (ATA 1 2 2 2 2 2  --2 1 ) Clock Time (24 hr) 09:25 09:33 10:03 10:08 10:38 10:43 - - 11:13 11:20 Treatment Length: 115 (minutes) Treatment Segments: 4 Vital Signs Capillary Blood Glucose Reference Range: 80 - 120 mg / dl HBO Diabetic Blood Glucose Intervention Range: <131 mg/dl or mg/dl Type: Time Vitals Blood Respiratory Capillary Blood Glucose Pulse Action Pulse: Temperature: Taken: Pressure: Rate: Glucose (mg/dl): Meter #: Oximetry (%) Taken: Pre 09:22 121/72 88 20 97.9 none per protocol Post 11:24 117/73 76 18 97.5 none per protocol Treatment Response Treatment Toleration: Well Treatment Completion Status: Treatment Completed without Adverse Event Physician HBO Attestation: I certify that I supervised this HBO treatment in accordance with Medicare guidelines.  A trained emergency response team is readily available per Yes hospital policies and procedures. Continue HBOT as ordered. Yes Electronic Signature(s) Signed: 06/19/2022 1:25:18 PM By: 08/19/2022 DO Previous Signature: 06/19/2022 11:32:57 AM Version By: 08/19/2022 CHT EMT BS , , Entered By: [name] on 06/19/2022 13:24:51 -------------------------------------------------------------------------------- HBO Safety Checklist Details Patient Name: Date of Service: 08/19/2022 [name] 06/19/2022 10:00 A M Medical Record Number: 08/19/2022 Patient Account Number: [id number] Date of Birth/Sex: Treating RN: Apr 05, 1966 (56 y.o. 54, Jimmy [name] Primary Care Denee Boeder: [name] Other Clinician: [name] Referring Saifullah Jolley: Treating Decker Cogdell/Extender: [name] in Treatment: 8 HBO Safety Checklist Items Safety Checklist Consent Form Signed Patient voided / foley secured and emptied When did you last eato 0850 Last dose of injectable or oral agent n/a Ostomy pouch emptied and vented if applicable NA All implantable devices assessed, documented and approved NA Intravenous access site secured and place NA Valuables secured Linens and cotton and cotton/polyester blend (less than 51% polyester) Personal oil-based products / skin lotions / body lotions removed Wigs or hairpieces removed NA Smoking or tobacco materials removed NA Books / newspapers / magazines / loose paper removed Cologne, aftershave, perfume and deodorant removed Jewelry removed (may wrap wedding band) Make-up removed NA Hair care products removed Battery operated devices (external) removed Heating patches and chemical warmers removed Titanium eyewear removed NA Nail polish cured greater than 10 hours NA Casting material cured greater than 10 hours NA Hearing aids removed NA Loose dentures or partials removed NA Prosthetics have been  removed NA Patient demonstrates correct use of air break device (if applicable) Patient concerns have been addressed Patient grounding bracelet on and cord attached to chamber Specifics for Inpatients (complete in addition to above) Medication sheet sent with patient NA Intravenous medications needed or due during  therapy sent with patient NA Drainage tubes (e.g. nasogastric tube or chest tube secured and vented) NA Endotracheal or Tracheotomy tube secured NA Cuff deflated of air and inflated with saline NA Airway suctioned NA Notes Paper version used prior to treatment. Electronic Signature(s) Signed: 06/19/2022 9:55:37 AM By: [name] CHT EMT BS , , Entered By: [name] on 06/19/2022 09:55:37

## 2022-06-19 NOTE — Progress Notes (Signed)
Jimmy [name], Jimmy [name] ([id number]) Visit Report for 06/19/2022 SuperBill Details Patient Name: Date of Service: [name] 06/19/2022 Medical Record Number: [id number] Patient Account Number: [id number] Date of Birth/Sex: Treating RN: 1965/12/16 (56 y.o. 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, unspecified Z21 Asymptomatic human immunodeficiency virus [HIV] infection status K26.0 Acute duodenal ulcer with hemorrhage Facility Procedures CPT4 Code Description Modifier Quantity [id number] G0277-(Facility Use Only) HBOT full body chamber, , 4 ICD-10 Diagnosis Description M86.68 Other chronic osteomyelitis, other site Z87.828 Personal history of other (healed) physical injury and trauma 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

## 2022-06-22 ENCOUNTER — Encounter: Payer: Self-pay | Admitting: Infectious Disease

## 2022-06-22 ENCOUNTER — Encounter (HOSPITAL_BASED_OUTPATIENT_CLINIC_OR_DEPARTMENT_OTHER): Payer: BC Managed Care – PPO | Admitting: Internal Medicine

## 2022-06-22 DIAGNOSIS — M8668 Other chronic osteomyelitis, other site: Secondary | ICD-10-CM | POA: Diagnosis not present

## 2022-06-22 DIAGNOSIS — Z21 Asymptomatic human immunodeficiency virus [HIV] infection status: Secondary | ICD-10-CM

## 2022-06-22 DIAGNOSIS — M269 Dentofacial anomaly, unspecified: Secondary | ICD-10-CM

## 2022-06-22 DIAGNOSIS — Z87828 Personal history of other (healed) physical injury and trauma: Secondary | ICD-10-CM | POA: Diagnosis not present

## 2022-06-22 DIAGNOSIS — K260 Acute duodenal ulcer with hemorrhage: Secondary | ICD-10-CM | POA: Diagnosis not present

## 2022-06-22 NOTE — Progress Notes (Signed)
KYE, HEDDEN ([id number]) Visit Report for 06/22/2022 Problem List Details Patient Name: Date of Service: [name] 06/22/2022 10:00 A M Medical Record Number: [id number] Patient Account Number: [id number] Date of Birth/Sex: Treating RN: 1966-08-27 (56 y.o. 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 06/22/2022 Medical Record Number: [id number] Patient Account Number: [id number] Date of Birth/Sex: Treating RN: 1966-10-16 (56 y.o. 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, unspecified Z21 Asymptomatic human immunodeficiency virus [HIV] infection status K26.0 Acute duodenal ulcer with hemorrhage Facility  Procedures CPT4 Code: [id number] Description: G0277-(Facility Use Only) HBOT full body chamber, , ICD-10 Diagnosis Description M86.68 Other chronic osteomyelitis, other site Z87.828 Personal history of other (healed) physical injury and trauma 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,  unspecified Z21 Asymptomatic human immunodeficiency virus [HIV] infection status Modifier: Quantity: 4 Physician Procedures : CPT4 Code Description Modifier [id number] 01751 - WC PHYS HYPERBARIC OXYGEN THERAPY ICD-10 Diagnosis Description M86.68 Other chronic osteomyelitis, other site Z87.828 Personal history of other (healed) physical injury and trauma M26.9 Dentofacial  anomaly, unspecified Z21 Asymptomatic human immunodeficiency virus [HIV] infection status Quantity: 1 Electronic Signature(s) Signed: 06/22/2022 12:27:48 PM By: [name] EMT Signed: 06/22/2022 4:20:20 PM By: Geralyn Corwin DO Entered By: [name] on 06/22/2022 12:27:48

## 2022-06-22 NOTE — Progress Notes (Addendum)
Jimmy [name], Jimmy [name] ([id number]) Visit Report for 06/22/2022 HBO Details Patient Name: Date of Service: [name] 06/22/2022 10:00 A M Medical Record Number: [id number] Patient Account Number: [id number] Date of Birth/Sex: Treating RN: 02-11-1966 (56 y.o. 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 HBO Treatment Details Treatment Number: 32 Patient Type: Outpatient Chamber Type: Monoplace Chamber Serial #: [id number] Treatment Protocol: 2.0 ATA with 90 minutes oxygen, with two 5 minute air breaks Treatment Details Compression Rate Down: 2.0 psi / minute De-Compression Rate Up: A breaks and breathing ir Compress Tx Pressure periods Decompress Decompress Begins Reached (leave unused spaces Begins Ends blank) Chamber Pressure (ATA 1 2 2 2 2 2  --2 1 ) Clock Time (24 hr) 09:52 10:00 10:30 10:35 11:05 11:10 - - 11:40 11:47 Treatment Length: 115 (minutes) Treatment Segments: 4 Vital Signs Capillary Blood Glucose Reference Range: 80 - 120 mg / dl HBO Diabetic Blood Glucose Intervention Range: <131 mg/dl or mg/dl Time Vitals Blood Respiratory Capillary Blood Glucose Pulse Action Type: Pulse: Temperature: Taken: Pressure: Rate: Glucose (mg/dl): Meter #: Oximetry (%) Taken: Pre 09:37 121/63 79 16 98.7 Post 11:50 115/67 69 18 98 Treatment Response Treatment Toleration: Well Treatment Completion Status: Treatment Completed without Adverse Event Physician HBO Attestation: I certify that I supervised this HBO treatment in accordance with Medicare guidelines. A trained emergency response team is readily  available per Yes hospital policies and procedures. Continue HBOT as ordered. Yes Electronic Signature(s) Signed: 06/22/2022 4:20:20 PM By: 08/22/2022 DO Previous Signature: 06/22/2022 12:27:21 PM Version By: 08/22/2022 EMT Previous Signature: 06/22/2022 10:19:32 AM Version By: 08/22/2022 EMT Entered By: Jimmy [name] on 06/22/2022 15:17:29 -------------------------------------------------------------------------------- HBO Safety Checklist Details Patient Name: Date of Service: 08/22/2022 [name] 06/22/2022 10:00 A M Medical Record Number: 08/22/2022 Patient Account Number: [id number] Date of Birth/Sex: Treating RN: Apr 09, 1966 (56 y.o. 54 Primary Care Jsoeph Podesta: Jimmy [name] Other Clinician: Alinda [name] Referring Sammi Stolarz: Treating Whitt Auletta/Extender: Jimmy [name] in Treatment: 8 HBO Safety Checklist Items Safety Checklist Consent Form Signed Patient voided / foley secured and emptied When did you last eato 0900 Last dose of injectable or oral agent NA Ostomy pouch emptied and vented if applicable NA All implantable devices assessed, documented and approved NA Intravenous access site secured and place NA Valuables secured Linens and cotton and cotton/polyester blend (less than 51% polyester) Personal oil-based products / skin lotions / body lotions removed Wigs or hairpieces removed NA Smoking or tobacco materials removed Books / newspapers / magazines / loose paper removed Cologne, aftershave, perfume and deodorant removed Jewelry removed (may wrap wedding band) NA Make-up removed NA Hair care products removed Battery operated devices (external) removed Heating patches and chemical warmers removed Titanium eyewear removed NA Nail polish cured greater than 10 hours NA Casting material cured greater than 10 hours NA Hearing aids removed NA Loose dentures or partials removed NA Prosthetics have been  removed NA Patient demonstrates correct use of air break device (if applicable) Patient concerns have been addressed Patient grounding bracelet on and cord attached to chamber Specifics for Inpatients (complete in addition to above) Medication sheet sent with patient NA Intravenous medications needed or due during therapy sent with patient  NA Drainage tubes (e.g. nasogastric tube or chest tube secured and vented) NA Endotracheal or Tracheotomy tube secured NA Cuff deflated of air and inflated with saline NA Airway suctioned NA Notes The safety checklist was done before the treatment was started. Electronic Signature(s) Signed: 06/22/2022 10:18:55 AM By: Jimmy [name] EMT Entered By: Jimmy [name] on 06/22/2022 10:18:55

## 2022-06-22 NOTE — Progress Notes (Addendum)
JACCOB, CZAPLICKI ([id number]) Visit Report for 06/22/2022 Arrival Information Details Patient Name: Date of Service: Jimmy [name]. 06/22/2022 10:00 A M Medical Record Number: [id number] Patient Account Number: [id number] Date of Birth/Sex: Treating RN: 09/25/1966 (56 y.o. [name] Primary Care Kiah Vanalstine: [name] Other Clinician: [name] Referring Eulene Pekar: Treating Jamy Cleckler/Extender: [name] in Treatment: 8 Visit Information History Since Last Visit All ordered tests and consults were completed: Yes Patient Arrived: Ambulatory Added or deleted any medications: No Arrival Time: 08:52 Any new allergies or adverse reactions: No Accompanied By: Wife Had a fall or experienced change in No Transfer Assistance: None activities of daily living that may affect Patient Identification Verified: Yes risk of falls: Secondary Verification Process Completed: Yes Signs or symptoms of abuse/neglect since last visito No Patient Requires Transmission-Based Precautions: No Hospitalized since last visit: No Patient Has Alerts: No Implantable device outside of the clinic excluding No cellular tissue based products placed in the center since last visit: Pain Present Now: No Electronic Signature(s) Signed: 06/22/2022 10:16:57 AM By: [name] EMT Entered By: [name] on 06/22/2022 10:16:57 -------------------------------------------------------------------------------- Encounter Discharge Information Details Patient Name: Date of Service: Jimmy [name] 06/22/2022 10:00 A M Medical Record Number: [id number] Patient Account Number: [id number] Date of Birth/Sex: Treating RN: [birth year] (56 y.o. [name] Primary Care Marylou Wages: [name] Other Clinician: [name] Referring Wilbern Pennypacker: Treating Ajayla Iglesias/Extender: [name] in Treatment: 8 Encounter Discharge Information Items Discharge  Condition: Stable Ambulatory Status: Ambulatory Discharge Destination: Home Transportation: Private Auto Accompanied By: Wife Schedule Follow-up Appointment: Yes Clinical Summary of Care: Electronic Signature(s) Signed: 06/22/2022 12:28:28 PM By: [name] EMT Entered By: [name] on 06/22/2022 12:28:27 -------------------------------------------------------------------------------- Vitals Details Patient Name: Date of Service: Jimmy [name] 06/22/2022 10:00 A M Medical Record Number: [id number] Patient Account Number: [id number] Date of Birth/Sex: Treating RN: 1966-01-24 (56 y.o. [name] Primary Care Donivan Thammavong: [name] Other Clinician: [name] Referring Emiya Loomer: Treating Elliott Quade/Extender: [name] in Treatment: 8 Vital Signs Time Taken: 09:37 Temperature (F): 98.7 Height (in): 71 Pulse (bpm): 79 Weight (lbs): 190 Respiratory Rate (breaths/min): 16 Body Mass Index (BMI): 26.5 Blood Pressure (mmHg): 121/63 Reference Range: 80 - 120 mg / dl Electronic Signature(s) Signed: 06/22/2022 10:17:30 AM By: [name] EMT Entered By: [name] on 06/22/2022 10:17:30

## 2022-06-23 ENCOUNTER — Encounter (HOSPITAL_BASED_OUTPATIENT_CLINIC_OR_DEPARTMENT_OTHER): Payer: BC Managed Care – PPO | Admitting: Internal Medicine

## 2022-06-23 DIAGNOSIS — Z21 Asymptomatic human immunodeficiency virus [HIV] infection status: Secondary | ICD-10-CM

## 2022-06-23 DIAGNOSIS — Z87828 Personal history of other (healed) physical injury and trauma: Secondary | ICD-10-CM

## 2022-06-23 DIAGNOSIS — M269 Dentofacial anomaly, unspecified: Secondary | ICD-10-CM

## 2022-06-23 DIAGNOSIS — K260 Acute duodenal ulcer with hemorrhage: Secondary | ICD-10-CM | POA: Diagnosis not present

## 2022-06-23 DIAGNOSIS — M8668 Other chronic osteomyelitis, other site: Secondary | ICD-10-CM | POA: Diagnosis not present

## 2022-06-23 NOTE — Progress Notes (Signed)
Jimmy [name], Jimmy [name] ([id number]) Visit Report for 06/23/2022 Problem List Details Patient Name: Date of Service: [name] 06/23/2022 10:00 A M Medical Record Number: [id number] Patient Account Number: [id number] Date of Birth/Sex: Treating RN: Sep 19, 1966 (56 y.o. 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 06/23/2022 Medical Record Number: [id number] Patient Account Number: [id number] Date of Birth/Sex: Treating RN: [birth year] (56 y.o. 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, unspecified Z21 Asymptomatic human immunodeficiency virus [HIV] infection status K26.0 Acute duodenal ulcer with hemorrhage Facility  Procedures CPT4 Code: [id number] Description: G0277-(Facility Use Only) HBOT full body chamber, , ICD-10 Diagnosis Description M86.68 Other chronic osteomyelitis, other site Z87.828 Personal history of other (healed) physical injury and trauma 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,  unspecified Z21 Asymptomatic human immunodeficiency virus [HIV] infection status Modifier: Quantity: 4 Physician Procedures : CPT4 Code Description Modifier [id number] - WC PHYS HYPERBARIC OXYGEN THERAPY ICD-10 Diagnosis Description M86.68 Other chronic osteomyelitis, other site Z87.828 Personal history of other (healed) physical injury and trauma M26.9 Dentofacial  anomaly, unspecified Z21 Asymptomatic human immunodeficiency virus [HIV] infection status Quantity: 1 Electronic Signature(s) Signed: 06/23/2022 1:15:11 PM By: [name] EMT Signed: 06/23/2022 1:52:58 PM By: Geralyn Corwin DO Entered By: [name] on 06/23/2022 13:15:11

## 2022-06-23 NOTE — Progress Notes (Addendum)
BLAYZE, HAEN ([id number]) Visit Report for 06/23/2022 HBO Details Patient Name: Date of Service: [name] 06/23/2022 10:00 A M Medical Record Number: [id number] Patient Account Number: [id number] Date of Birth/Sex: Treating RN: [birth year] (56 y.o. 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 HBO Treatment Details Treatment Number: 33 Patient Type: Outpatient Chamber Type: Monoplace Chamber Serial #: [id number] Treatment Protocol: 2.0 ATA with 90 minutes oxygen, with two 5 minute air breaks Treatment Details Compression Rate Down: 2.0 psi / minute De-Compression Rate Up: 2.0 psi / minute A breaks and breathing ir Compress Tx Pressure periods Decompress Decompress Begins Reached (leave unused spaces Begins Ends blank) Chamber Pressure (ATA 1 2 2 2 2 2  --2 1 ) Clock Time (24 hr) 09:58 10:09 10:39 10:44 11:14 11:19 - - 11:49 11:56 Treatment Length: 118 (minutes) Treatment Segments: 4 Vital Signs Capillary Blood Glucose Reference Range: 80 - 120 mg / dl HBO Diabetic Blood Glucose Intervention Range: <131 mg/dl or mg/dl Time Vitals Blood Respiratory Capillary Blood Glucose Pulse Action Type: Pulse: Temperature: Taken: Pressure: Rate: Glucose (mg/dl): Meter #: Oximetry (%) Taken: Pre 09:42 137/81 82 16 98.1 Post 11:57 121/78 70 16 97.8 Treatment Response Treatment Toleration: Well Treatment Completion Status: Treatment Completed without Adverse Event Physician HBO Attestation: I certify that I supervised this HBO treatment in accordance with Medicare guidelines. A trained emergency response  team is readily available per Yes hospital policies and procedures. Continue HBOT as ordered. Yes Electronic Signature(s) Signed: 06/23/2022 1:52:58 PM By: 08/23/2022 DO Previous Signature: 06/23/2022 1:14:46 PM Version By: 08/23/2022 EMT Entered By: [name] on 06/23/2022 13:52:25 -------------------------------------------------------------------------------- HBO Safety Checklist Details Patient Name: Date of Service: 08/23/2022 [name] 06/23/2022 10:00 A M Medical Record Number: 08/23/2022 Patient Account Number: [id number] Date of Birth/Sex: Treating RN: 1966-06-02 (56 y.o. 54, [name] Primary Care Jeneane Pieczynski: Charlean Merl Other Clinician: [name] Referring Anothony Bursch: Treating Jolayne Branson/Extender: [name] in Treatment: 8 HBO Safety Checklist Items Safety Checklist Consent Form Signed Patient voided / foley secured and emptied When did you last eato 0900 Last dose of injectable or oral agent NA Ostomy pouch emptied and vented if applicable NA All implantable devices assessed, documented and approved NA Intravenous access site secured and place NA Valuables secured Linens and cotton and cotton/polyester blend (less than 51% polyester) Personal oil-based products / skin lotions / body lotions removed Wigs or hairpieces removed NA Smoking or tobacco materials removed Books / newspapers / magazines / loose paper removed Cologne, aftershave, perfume and deodorant removed Jewelry removed (may wrap wedding band) NA Make-up removed NA Hair care products removed Battery operated devices (external) removed Heating patches and chemical warmers removed Titanium eyewear removed NA Nail polish cured greater than 10 hours NA Casting material cured greater than 10 hours NA Hearing aids removed NA Loose dentures or partials removed NA Prosthetics have been removed NA Patient demonstrates correct use of air break  device (if applicable) Patient concerns have been addressed Patient grounding bracelet on and cord attached to chamber Specifics for Inpatients (complete in addition to above) Medication sheet sent with patient NA Intravenous medications needed or due during therapy sent with patient NA Drainage tubes (e.g. nasogastric tube  or chest tube secured and vented) NA Endotracheal or Tracheotomy tube secured NA Cuff deflated of air and inflated with saline NA Airway suctioned NA Notes The safety checklist was done before the treatment was started. Electronic Signature(s) Signed: 06/23/2022 1:12:18 PM By: [name] EMT Entered By: [name] on 06/23/2022 13:12:18

## 2022-06-23 NOTE — Progress Notes (Addendum)
RAFAY, DAHAN ([id number]) Visit Report for 06/23/2022 Arrival Information Details Patient Name: Date of Service: [name] 06/23/2022 10:00 A M Medical Record Number: [id number] Patient Account Number: [id number] Date of Birth/Sex: Treating RN: 1966/11/02 (56 y.o. Charlean Merl, [name] Primary Care Dorlisa Savino: [name] Other Clinician: [name] Referring Adelee Hannula: Treating Dorothea Yow/Extender: [name] in Treatment: 8 Visit Information History Since Last Visit All ordered tests and consults were completed: Yes Patient Arrived: Ambulatory Added or deleted any medications: No Arrival Time: 09:32 Any new allergies or adverse reactions: No Accompanied By: Wife Had a fall or experienced change in No Transfer Assistance: None activities of daily living that may affect Patient Identification Verified: Yes risk of falls: Secondary Verification Process Completed: Yes Signs or symptoms of abuse/neglect since last visito No Patient Requires Transmission-Based Precautions: No Hospitalized since last visit: No Patient Has Alerts: No Implantable device outside of the clinic excluding No cellular tissue based products placed in the center since last visit: Pain Present Now: No Electronic Signature(s) Signed: 06/23/2022 12:40:36 PM By: [name] EMT Entered By: [name] on 06/23/2022 12:40:36 -------------------------------------------------------------------------------- Encounter Discharge Information Details Patient Name: Date of Service: [name] 06/23/2022 10:00 A M Medical Record Number: [id number] Patient Account Number: [id number] Date of Birth/Sex: Treating RN: 01-31-1966 (56 y.o. Charlean Merl, [name] Primary Care Geofrey Silliman: [name] Other Clinician: [name] Referring Kalifa Cadden: Treating Kawan Vallad[place]/Extender: [name] in Treatment: 8 Encounter Discharge Information  Items Discharge Condition: Stable Ambulatory Status: Ambulatory Discharge Destination: Home Transportation: Private Auto Accompanied By: Wife Schedule Follow-up Appointment: Yes Clinical Summary of Care: Electronic Signature(s) Signed: 06/23/2022 1:15:46 PM By: [name] EMT Entered By: [name] on 06/23/2022 13:15:46 -------------------------------------------------------------------------------- Vitals Details Patient Name: Date of Service: [name] 06/23/2022 10:00 A M Medical Record Number: [id number] Patient Account Number: [id number] Date of Birth/Sex: Treating RN: 11-13-1966 (56 y.o. Charlean Merl, [name] Primary Care Derric Dealmeida: [name] Other Clinician: [name] Referring Ceonna Frazzini: Treating Jyrah Blye/Extender: [name] in Treatment: 8 Vital Signs Time Taken: 09:42 Temperature (F): 98.1 Height (in): 71 Pulse (bpm): 82 Weight (lbs): 190 Respiratory Rate (breaths/min): 16 Body Mass Index (BMI): 26.5 Blood Pressure (mmHg): 137/81 Reference Range: 80 - 120 mg / dl Electronic Signature(s) Signed: 06/23/2022 12:41:00 PM By: [name] EMT Entered By: [name] on 06/23/2022 12:41:00

## 2022-06-24 ENCOUNTER — Ambulatory Visit (INDEPENDENT_AMBULATORY_CARE_PROVIDER_SITE_OTHER): Payer: BC Managed Care – PPO | Admitting: Infectious Disease

## 2022-06-24 ENCOUNTER — Other Ambulatory Visit: Payer: Self-pay

## 2022-06-24 ENCOUNTER — Encounter: Payer: Self-pay | Admitting: Infectious Disease

## 2022-06-24 ENCOUNTER — Telehealth: Payer: Self-pay

## 2022-06-24 ENCOUNTER — Encounter (HOSPITAL_BASED_OUTPATIENT_CLINIC_OR_DEPARTMENT_OTHER): Payer: BC Managed Care – PPO | Admitting: General Surgery

## 2022-06-24 VITALS — BP 150/78 | HR 88 | Temp 98.0°F | Ht 71.0 in | Wt 201.0 lb

## 2022-06-24 DIAGNOSIS — Z21 Asymptomatic human immunodeficiency virus [HIV] infection status: Secondary | ICD-10-CM | POA: Diagnosis not present

## 2022-06-24 DIAGNOSIS — M272 Inflammatory conditions of jaws: Secondary | ICD-10-CM

## 2022-06-24 DIAGNOSIS — B20 Human immunodeficiency virus [HIV] disease: Secondary | ICD-10-CM | POA: Diagnosis not present

## 2022-06-24 DIAGNOSIS — I10 Essential (primary) hypertension: Secondary | ICD-10-CM | POA: Diagnosis not present

## 2022-06-24 DIAGNOSIS — Z113 Encounter for screening for infections with a predominantly sexual mode of transmission: Secondary | ICD-10-CM | POA: Diagnosis not present

## 2022-06-24 DIAGNOSIS — K260 Acute duodenal ulcer with hemorrhage: Secondary | ICD-10-CM | POA: Diagnosis not present

## 2022-06-24 DIAGNOSIS — E782 Mixed hyperlipidemia: Secondary | ICD-10-CM | POA: Diagnosis not present

## 2022-06-24 DIAGNOSIS — K922 Gastrointestinal hemorrhage, unspecified: Secondary | ICD-10-CM

## 2022-06-24 DIAGNOSIS — M8668 Other chronic osteomyelitis, other site: Secondary | ICD-10-CM | POA: Diagnosis not present

## 2022-06-24 DIAGNOSIS — M269 Dentofacial anomaly, unspecified: Secondary | ICD-10-CM | POA: Diagnosis not present

## 2022-06-24 DIAGNOSIS — Z87828 Personal history of other (healed) physical injury and trauma: Secondary | ICD-10-CM | POA: Diagnosis not present

## 2022-06-24 DIAGNOSIS — Z880 Allergy status to penicillin: Secondary | ICD-10-CM

## 2022-06-24 DIAGNOSIS — S0993XD Unspecified injury of face, subsequent encounter: Secondary | ICD-10-CM

## 2022-06-24 HISTORY — DX: Inflammatory conditions of jaws: M27.2

## 2022-06-24 HISTORY — DX: Allergy status to penicillin: Z88.0

## 2022-06-24 MED ORDER — BIKTARVY 50-200-25 MG PO TABS: 1.0000 | ORAL_TABLET | Freq: Every day | ORAL | 11 refills | Status: DC

## 2022-06-24 NOTE — Telephone Encounter (Signed)
[facility], [facility] office [facility] office, Dr. Stan Head) to confirm fax number for request for information. Faxed paperwork to [phone].  Wyvonne Lenz, RN

## 2022-06-24 NOTE — Progress Notes (Signed)
Jimmy [name], Jimmy [name] ([id number]) Visit Report for 06/24/2022 SuperBill Details Patient Name: Date of Service: [name] 06/24/2022 Medical Record Number: [id number] Patient Account Number: [id number] Date of Birth/Sex: Treating RN: 1966/08/04 (56 y.o. 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, unspecified Z21 Asymptomatic human immunodeficiency virus [HIV] infection status K26.0 Acute duodenal ulcer with hemorrhage Facility Procedures CPT4 Code Description Modifier Quantity [id number] G0277-(Facility Use Only) HBOT full body chamber, , 4 ICD-10 Diagnosis Description M86.68 Other chronic osteomyelitis, other site Z87.828 Personal history of other (healed) physical injury and trauma 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

## 2022-06-24 NOTE — Progress Notes (Addendum)
Jimmy [name], Jimmy [name] ([id number]) Visit Report for 06/24/2022 HBO Details Patient Name: Date of Service: [name] 06/24/2022 10:00 A M Medical Record Number: [id number] Patient Account Number: [id number] Date of Birth/Sex: Treating RN: Feb 02, 1966 (56 y.o. 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 HBO Treatment Details Treatment Number: 34 Patient Type: Outpatient Chamber Type: Monoplace Chamber Serial #: [id number] Treatment Protocol: 2.0 ATA with 90 minutes oxygen, with two 5 minute air breaks Treatment Details Compression Rate Down: 2.0 psi / minute De-Compression Rate Up: 2.0 psi / minute A breaks and breathing ir Compress Tx Pressure periods Decompress Decompress Begins Reached (leave unused spaces Begins Ends blank) Chamber Pressure (ATA 1 2 2 2 2 2  --2 1 ) Clock Time (24 hr) 09:41 09:51 10:21 10:26 10:56 11:01 - - 11:31 11:39 Treatment Length: 118 (minutes) Treatment Segments: 4 Vital Signs Capillary Blood Glucose Reference Range: 80 - 120 mg / dl HBO Diabetic Blood Glucose Intervention Range: <131 mg/dl or mg/dl Time Vitals Blood Respiratory Capillary Blood Glucose Pulse Action Type: Pulse: Temperature: Taken: Pressure: Rate: Glucose (mg/dl): Meter #: Oximetry (%) Taken: Pre 10:00 139/73 81 22 98.2 Post 11:41 129/79 77 18 97.9 Treatment Response Treatment Toleration: Well Treatment Completion Status: Treatment Completed without Adverse Event Physician HBO Attestation: I certify that I supervised this HBO treatment in accordance with Medicare guidelines. A trained emergency response  team is readily available per Yes hospital policies and procedures. Continue HBOT as ordered. Yes Electronic Signature(s) Signed: 06/24/2022 3:54:32 PM By: 08/24/2022 MD FACS Previous Signature: 06/24/2022 12:00:00 PM Version By: 08/24/2022 CHT EMT BS , , Entered By: Jimmy [name] on 06/24/2022 15:54:32 -------------------------------------------------------------------------------- HBO Safety Checklist Details Patient Name: Date of Service: 08/24/2022 [name] 06/24/2022 10:00 A M Medical Record Number: 08/24/2022 Patient Account Number: [id number] Date of Birth/Sex: Treating RN: [birth year] (56 y.o. 53 Primary Care Jimmy [name]: Jimmy [name] Other Clinician: Alinda [name] Referring Jimmy [name]: Treating Jimmy [name]/Extender: Jimmy [name] in Treatment: 8 HBO Safety Checklist Items Safety Checklist Consent Form Signed Patient voided / foley secured and emptied When did you last eato 0800 Last dose of injectable or oral agent n/a Ostomy pouch emptied and vented if applicable NA All implantable devices assessed, documented and approved NA Intravenous access site secured and place NA Valuables secured Linens and cotton and cotton/polyester blend (less than 51% polyester) Personal oil-based products / skin lotions / body lotions removed Wigs or hairpieces removed NA Smoking or tobacco materials removed NA Books / newspapers / magazines / loose paper removed Cologne, aftershave, perfume and deodorant removed Jewelry removed (may wrap wedding band) Make-up removed NA Hair care products removed Battery operated devices (external) removed Heating patches and chemical warmers removed Titanium eyewear removed NA Nail polish cured greater than 10 hours NA Casting material cured greater than 10 hours NA Hearing aids removed NA Loose dentures or partials removed NA Prosthetics have been removed NA Patient demonstrates  correct use of air break device (if applicable) Patient concerns have been addressed Patient grounding bracelet on and cord attached to chamber Specifics for Inpatients (complete in addition to above) Medication sheet sent with patient NA Intravenous medications needed or due during therapy sent with patient NA  Drainage tubes (e.g. nasogastric tube or chest tube secured and vented) NA Endotracheal or Tracheotomy tube secured NA Cuff deflated of air and inflated with saline NA Airway suctioned NA Notes Paper version used prior to treatment. Electronic Signature(s) Signed: 06/24/2022 11:58:50 AM By: Jimmy [name] CHT EMT BS , , Entered By: Jimmy [name] on 06/24/2022 11:58:50
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
## 2022-06-24 NOTE — Progress Notes (Signed)
TAVISH, GETTIS ([id number]) Visit Report for 06/24/2022 Arrival Information Details Patient Name: Date of Service: [name] 06/24/2022 10:00 A M Medical Record Number: [id number] Patient Account Number: [id number] Date of Birth/Sex: Treating RN: 08-Aug-1966 (56 y.o. Jimmy [name], [name] Primary Care Penney Domanski: [name] Other Clinician: [name] Referring Najeh Credit: Treating Laquita Harlan/Extender: [name] in Treatment: 8 Visit Information History Since Last Visit All ordered tests and consults were completed: Yes Patient Arrived: Ambulatory Added or deleted any medications: No Arrival Time: 09:34 Any new allergies or adverse reactions: No Accompanied By: self Had a fall or experienced change in No Transfer Assistance: None activities of daily living that may affect Patient Identification Verified: Yes risk of falls: Secondary Verification Process Completed: Yes Signs or symptoms of abuse/neglect since last visito No Patient Requires Transmission-Based Precautions: No Hospitalized since last visit: No Patient Has Alerts: No Implantable device outside of the clinic excluding No cellular tissue based products placed in the center since last visit: Pain Present Now: No Electronic Signature(s) Signed: 06/24/2022 11:56:22 AM By: [name] CHT EMT BS , , Entered By: [name] on 06/24/2022 11:56:22 -------------------------------------------------------------------------------- Encounter Discharge Information Details Patient Name: Date of Service: Jimmy [name]. 06/24/2022 10:00 A M Medical Record Number: [id number] Patient Account Number: [id number] Date of Birth/Sex: Treating RN: 10-Dec-1965 (56 y.o. Jimmy [name] Primary Care Laketra Bowdish: [name] Other Clinician: [name] Referring Reve Crocket: Treating Lesean Woolverton/Extender: [name] in Treatment: 8 Encounter Discharge  Information Items Discharge Condition: Stable Ambulatory Status: Ambulatory Discharge Destination: Home Transportation: Private Auto Accompanied By: self Schedule Follow-up Appointment: No Clinical Summary of Care: Electronic Signature(s) Signed: 06/24/2022 12:00:54 PM By: [name] CHT EMT BS , , Entered By: [name] on 06/24/2022 12:00:54 -------------------------------------------------------------------------------- Vitals Details Patient Name: Date of Service: Jimmy [name]. 06/24/2022 10:00 A M Medical Record Number: [id number] Patient Account Number: [id number] Date of Birth/Sex: Treating RN: 02-May-1966 (56 y.o. Jimmy [name] Primary Care Essa Wenk: [name] Other Clinician: [name] Referring Kesler Wickham: Treating Masako Overall/Extender: [name] in Treatment: 8 Vital Signs Time Taken: 10:00 Temperature (F): 98.2 Height (in): 71 Pulse (bpm): 81 Weight (lbs): 190 Respiratory Rate (breaths/min): 22 Body Mass Index (BMI): 26.5 Blood Pressure (mmHg): 139/73 Reference Range: 80 - 120 mg / dl Electronic Signature(s) Signed: 06/24/2022 11:57:28 AM By: [name] CHT EMT BS , , Entered By: [name] on 06/24/2022 11:57:28

## 2022-06-25 ENCOUNTER — Encounter (HOSPITAL_BASED_OUTPATIENT_CLINIC_OR_DEPARTMENT_OTHER): Payer: BC Managed Care – PPO | Admitting: General Surgery

## 2022-06-25 ENCOUNTER — Encounter: Payer: Self-pay | Admitting: Gastroenterology

## 2022-06-25 DIAGNOSIS — Z87828 Personal history of other (healed) physical injury and trauma: Secondary | ICD-10-CM | POA: Diagnosis not present

## 2022-06-25 DIAGNOSIS — M8668 Other chronic osteomyelitis, other site: Secondary | ICD-10-CM | POA: Diagnosis not present

## 2022-06-25 DIAGNOSIS — M269 Dentofacial anomaly, unspecified: Secondary | ICD-10-CM | POA: Diagnosis not present

## 2022-06-25 DIAGNOSIS — K260 Acute duodenal ulcer with hemorrhage: Secondary | ICD-10-CM | POA: Diagnosis not present

## 2022-06-25 DIAGNOSIS — Z21 Asymptomatic human immunodeficiency virus [HIV] infection status: Secondary | ICD-10-CM | POA: Diagnosis not present

## 2022-06-25 LAB — T-HELPER CELLS (CD4) COUNT (NOT AT ARMC)
CD4 % Helper T Cell: 52 % (ref 33–65)
CD4 T Cell Abs: 874 /uL (ref 400–1790)
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
## 2022-06-25 NOTE — Progress Notes (Addendum)
Jimmy [name], Jimmy [name] ([id number]) Visit Report for 06/25/2022 Arrival Information Details Patient Name: Date of Service: Jimmy [name]. 06/25/2022 10:00 A M Medical Record Number: [id number] Patient Account Number: [id number] Date of Birth/Sex: Treating RN: 1966-06-28 (56 y.o. [name], [name] Primary Care Zakariyah Freimark: [name] Other Clinician: [name] Referring Mekhi Lascola: Treating Aloysious Vangieson/Extender: [name] in Treatment: 8 Visit Information History Since Last Visit All ordered tests and consults were completed: Yes Patient Arrived: Ambulatory Added or deleted any medications: No Arrival Time: 09:17 Any new allergies or adverse reactions: No Accompanied By: spouse Had a fall or experienced change in No Transfer Assistance: None activities of daily living that may affect Patient Identification Verified: Yes risk of falls: Secondary Verification Process Completed: Yes Signs or symptoms of abuse/neglect since last visito No Patient Requires Transmission-Based Precautions: No Hospitalized since last visit: No Patient Has Alerts: No Implantable device outside of the clinic excluding No cellular tissue based products placed in the center since last visit: Pain Present Now: No Electronic Signature(s) Signed: 06/25/2022 1:05:44 PM By: [name] CHT EMT BS , , Previous Signature: 06/25/2022 11:44:39 AM Version By: [name] CHT EMT BS , , Entered By: [name] on 06/25/2022 13:05:43 -------------------------------------------------------------------------------- Encounter Discharge Information Details Patient Name: Date of Service: Jimmy Nian [name] 06/25/2022 10:00 A M Medical Record Number: [id number] Patient Account Number: [id number] Date of Birth/Sex: Treating RN: 02/12/1966 (56 y.o. [name] Primary Care Yalena Colon: [name] Other Clinician: [name] Referring Reba Hulett: Treating  Reynaldo Rossman/Extender: [name] in Treatment: 8 Encounter Discharge Information Items Discharge Condition: Stable Ambulatory Status: Ambulatory Discharge Destination: Home Transportation: Private Auto Accompanied By: spouse Schedule Follow-up Appointment: No Clinical Summary of Care: Electronic Signature(s) Signed: 06/25/2022 1:05:17 PM By: [name] CHT EMT BS , , Entered By: [name] on 06/25/2022 13:05:17 -------------------------------------------------------------------------------- Vitals Details Patient Name: Date of Service: Jimmy Nian [name] 06/25/2022 10:00 A M Medical Record Number: [id number] Patient Account Number: [id number] Date of Birth/Sex: Treating RN: February 21, 1966 (56 y.o. [name] Primary Care Jackob Crookston: [name] Other Clinician: [name] Referring Amandamarie Feggins: Treating Rylinn Linzy/Extender: [name] in Treatment: 8 Vital Signs Time Taken: 09:25 Temperature (F): 98 Height (in): 71 Pulse (bpm): 89 Weight (lbs): 190 Respiratory Rate (breaths/min): 20 Body Mass Index (BMI): 26.5 Blood Pressure (mmHg): 119/77 Reference Range: 80 - 120 mg / dl Electronic Signature(s) Signed: 06/25/2022 11:44:59 AM By: [name] CHT EMT BS , , Entered By: [name] on 06/25/2022 11:44:59

## 2022-06-25 NOTE — Progress Notes (Addendum)
GILLIAM, HAWKES ([id number]) Visit Report for 06/25/2022 HBO Details Patient Name: Date of Service: [name] 06/25/2022 10:00 A M Medical Record Number: [id number] Patient Account Number: [id number] Date of Birth/Sex: Treating RN: 08/07/1966 (56 y.o. [name] Primary Care Nuala Chiles: [name] Other Clinician: [name] Referring Grisel Blumenstock: Treating Hamdi Vari/Extender: [name] in Treatment: 8 HBO Treatment Course Details Treatment Course Number: 1 Ordering Laurice Kimmons: [name] Treatments Ordered: otal 60 HBO Treatment Start Date: 05/04/2022 HBO Indication: Chronic Refractory Osteomyelitis to of mandible HBO Treatment Details Treatment Number: 35 Patient Type: Outpatient Chamber Type: Monoplace Chamber Serial #: [id number] Treatment Protocol: 2.0 ATA with 90 minutes oxygen, with two 5 minute air breaks Treatment Details Compression Rate Down: 2.0 psi / minute De-Compression Rate Up: 2.0 psi / minute A breaks and breathing ir Compress Tx Pressure periods Decompress Decompress Begins Reached (leave unused spaces Begins Ends blank) Chamber Pressure (ATA 1 2 2 2 2 2  --2 1 ) Clock Time (24 hr) 09:28 09:36 10:06 10:11 10:41 10:46 - - 11:16 11:24 Treatment Length: 116 (minutes) Treatment Segments: 4 Vital Signs Capillary Blood Glucose Reference Range: 80 - 120 mg / dl HBO Diabetic Blood Glucose Intervention Range: <131 mg/dl or mg/dl Type: Time Vitals Blood Respiratory Capillary Blood Glucose Pulse Action Pulse: Temperature: Taken: Pressure: Rate: Glucose (mg/dl): Meter #: Oximetry (%) Taken: Pre 09:25 119/77 89 20 98 none per protocol Post 11:25 126/73 75 18 97.7 none per protocol Treatment Response Treatment Toleration: Well Treatment Completion Status: Treatment Completed without Adverse Event Physician HBO Attestation: I certify that I supervised this HBO treatment in accordance with  Medicare guidelines. A trained emergency response team is readily available per Yes hospital policies and procedures. Continue HBOT as ordered. Yes Electronic Signature(s) Signed: 06/25/2022 4:48:00 PM By: 08/25/2022 MD FACS Previous Signature: 06/25/2022 3:58:30 PM Version By: 08/25/2022 CHT EMT BS , , Entered By: [name] on 06/25/2022 16:48:00 -------------------------------------------------------------------------------- HBO Safety Checklist Details Patient Name: Date of Service: 08/25/2022 [name] 06/25/2022 10:00 A M Medical Record Number: 08/25/2022 Patient Account Number: [id number] Date of Birth/Sex: Treating RN: 1966/10/24 (56 y.o. 54 Primary Care Bailyn Spackman: [name] Other Clinician: [name] Referring Ivett Luebbe: Treating Waneda Klammer/Extender: [name] in Treatment: 8 HBO Safety Checklist Items Safety Checklist Consent Form Signed Patient voided / foley secured and emptied When did you last eato 0800 Last dose of injectable or oral agent n/a Ostomy pouch emptied and vented if applicable NA All implantable devices assessed, documented and approved NA Intravenous access site secured and place NA Valuables secured Linens and cotton and cotton/polyester blend (less than 51% polyester) Personal oil-based products / skin lotions / body lotions removed Wigs or hairpieces removed NA Smoking or tobacco materials removed NA Books / newspapers / magazines / loose paper removed Cologne, aftershave, perfume and deodorant removed Jewelry removed (may wrap wedding band) Make-up removed Hair care products removed Battery operated devices (external) removed Heating patches and chemical warmers removed Titanium eyewear removed NA Nail polish cured greater than 10 hours NA Casting material cured greater than 10 hours NA Hearing aids removed NA Loose dentures or partials removed NA Prosthetics  have been removed NA Patient demonstrates correct use of air break device (if applicable) Patient concerns have been addressed Patient grounding bracelet on and cord attached to chamber Specifics for Inpatients (complete in addition to above) Medication sheet sent with patient NA Intravenous medications needed or due during  therapy sent with patient NA Drainage tubes (e.g. nasogastric tube or chest tube secured and vented) NA Endotracheal or Tracheotomy tube secured NA Cuff deflated of air and inflated with saline NA Airway suctioned NA Notes Paper version used prior to treatment. Electronic Signature(s) Signed: 06/25/2022 11:46:15 AM By: [name] CHT EMT BS , , Entered By: [name] on 06/25/2022 11:46:15

## 2022-06-26 ENCOUNTER — Encounter (HOSPITAL_BASED_OUTPATIENT_CLINIC_OR_DEPARTMENT_OTHER): Payer: BC Managed Care – PPO | Admitting: Internal Medicine

## 2022-06-26 ENCOUNTER — Encounter: Payer: Self-pay | Admitting: Infectious Disease

## 2022-06-26 DIAGNOSIS — Z87828 Personal history of other (healed) physical injury and trauma: Secondary | ICD-10-CM | POA: Diagnosis not present

## 2022-06-26 DIAGNOSIS — Z21 Asymptomatic human immunodeficiency virus [HIV] infection status: Secondary | ICD-10-CM

## 2022-06-26 DIAGNOSIS — K260 Acute duodenal ulcer with hemorrhage: Secondary | ICD-10-CM | POA: Diagnosis not present

## 2022-06-26 DIAGNOSIS — M269 Dentofacial anomaly, unspecified: Secondary | ICD-10-CM

## 2022-06-26 DIAGNOSIS — M8668 Other chronic osteomyelitis, other site: Secondary | ICD-10-CM

## 2022-06-26 LAB — CBC WITH DIFFERENTIAL/PLATELET
Absolute Monocytes: 435 cells/uL (ref 200–950)
Basophils Absolute: 41 cells/uL (ref 0–200)
Basophils Relative: 0.6 %
Eosinophils Absolute: 179 cells/uL (ref 15–500)
Eosinophils Relative: 2.6 %
HCT: 46.6 % (ref 38.5–50.0)
Hemoglobin: 15.5 g/dL (ref 13.2–17.1)
Lymphs Abs: 1794 cells/uL (ref 850–3900)
MCH: 28.8 pg (ref 27.0–33.0)
MCHC: 33.3 g/dL (ref 32.0–36.0)
MCV: 86.6 fL (ref 80.0–100.0)
MPV: 10.2 fL (ref 7.5–12.5)
Monocytes Relative: 6.3 %
Neutro Abs: 4451 cells/uL (ref 1500–7800)
Neutrophils Relative %: 64.5 %
Platelets: 382 10*3/uL (ref 140–400)
RBC: 5.38 10*6/uL (ref 4.20–5.80)
RDW: 20.9 % — ABNORMAL HIGH (ref 11.0–15.0)
Total Lymphocyte: 26 %
WBC: 6.9 10*3/uL (ref 3.8–10.8)

## 2022-06-26 LAB — COMPLETE METABOLIC PANEL WITH GFR
AG Ratio: 1.8 (calc) (ref 1.0–2.5)
ALT: 23 U/L (ref 9–46)
AST: 18 U/L (ref 10–35)
Albumin: 4.8 g/dL (ref 3.6–5.1)
Alkaline phosphatase (APISO): 63 U/L (ref 35–144)
BUN: 12 mg/dL (ref 7–25)
CO2: 30 mmol/L (ref 20–32)
Calcium: 10.1 mg/dL (ref 8.6–10.3)
Chloride: 104 mmol/L (ref 98–110)
Creat: 0.87 mg/dL (ref 0.70–1.30)
Globulin: 2.7 g/dL (calc) (ref 1.9–3.7)
Glucose, Bld: 72 mg/dL (ref 65–99)
Potassium: 4.1 mmol/L (ref 3.5–5.3)
Sodium: 140 mmol/L (ref 135–146)
Total Bilirubin: 0.2 mg/dL (ref 0.2–1.2)
Total Protein: 7.5 g/dL (ref 6.1–8.1)
eGFR: 102 mL/min/{1.73_m2} (ref >=60)

## 2022-06-26 LAB — LIPID PANEL
Cholesterol: 159 mg/dL (ref <200)
HDL: 41 mg/dL (ref >=40)
LDL Cholesterol (Calc): 83 mg/dL (calc)
Non-HDL Cholesterol (Calc): 118 mg/dL (calc) (ref <130)
Total CHOL/HDL Ratio: 3.9 (calc) (ref <5.0)
Triglycerides: 265 mg/dL — ABNORMAL HIGH (ref <150)

## 2022-06-26 LAB — RPR: RPR Ser Ql: NONREACTIVE

## 2022-06-26 LAB — HIV-1 RNA QUANT-NO REFLEX-BLD
HIV 1 RNA Quant: NOT DETECTED Copies/mL
HIV-1 RNA Quant, Log: NOT DETECTED Log cps/mL

## 2022-06-26 NOTE — Progress Notes (Addendum)
Jimmy [name] ([id number]) Visit Report for 06/26/2022 HBO Details Patient Name: Date of Service: Jimmy [name]. 06/26/2022 10:00 A M Medical Record Number: [id number] Patient Account Number: [id number] Date of Birth/Sex: Treating RN: [birth year] (56 y.o. 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 HBO [name] Details [name] Number: 36 Patient Type: Outpatient Chamber Type: Monoplace Chamber Serial #: [id number] [name] Protocol: 2.0 ATA with 90 minutes oxygen, with two 5 minute air breaks [name] Details Compression Rate Down: 2.0 psi / minute De-Compression Rate Up: 2.0 psi / minute A breaks and breathing ir Compress Tx Pressure periods Decompress Decompress Begins Reached (leave unused spaces Begins Ends blank) Chamber Pressure (ATA 1 2 2 2 2 2  --2 1 ) Clock Time (24 hr) 10:18 10:27 10:57 11:02 11:32 11:37 - - 12:07 12:14 [name] Length: 116 (minutes) [name] Segments: 4 Vital Signs Capillary Blood Glucose Reference Range: 80 - 120 mg / dl HBO Diabetic Blood Glucose Intervention Range: <131 mg/dl or mg/dl Time Vitals Blood Respiratory Capillary Blood Glucose Pulse Action Type: Pulse: Temperature: Taken: Pressure: Rate: Glucose (mg/dl): Meter #: Oximetry (%) Taken: Pre 09:59 122/71 75 16 97.3 Post 12:15 119/79 74 16 97.1 [name] Response [name] Toleration: Well [name] Completion Status: [name] Completed without Adverse Event Physician HBO Attestation: I certify that I supervised this HBO [name] in accordance with Medicare guidelines. A trained emergency response  team is readily available per Yes hospital policies and procedures. Continue HBOT as ordered. Yes Electronic Signature(s) Unsigned Previous Signature: 06/26/2022 1:05:56 PM Version By: 08/26/2022 EMT Previous Signature: 06/26/2022 1:41:40 PM Version By: 08/26/2022 DO Previous Signature: 06/26/2022 11:15:07 AM Version By: 08/26/2022 EMT Entered By: Jimmy [name] on 06/26/2022 13:43:23 Signature(s): Date(s): -------------------------------------------------------------------------------- HBO Safety Checklist Details Patient Name: Date of Service: 08/26/2022 Jimmy W. 06/26/2022 10:00 A M Medical Record Number: 08/26/2022 Patient Account Number: [id number] Date of Birth/Sex: Treating RN: Jan 10, 1966 (56 y.o. 53 Primary Care Jimmy [name] Other Clinician: Alinda [name] Referring Jimmy [name]: Treating Jimmy [name]: Jimmy [name] in [name]: 9 HBO Safety Checklist Items Safety Checklist Consent Form Signed Patient voided / foley secured and emptied When did you last eato 0800 Last dose of injectable or oral agent NA Ostomy pouch emptied and vented if applicable NA All implantable devices assessed, documented and approved NA Intravenous access site secured and place NA Valuables secured Linens and cotton and cotton/polyester blend (less than 51% polyester) Personal oil-based products / skin lotions / body lotions removed Wigs or hairpieces removed NA Smoking or tobacco materials removed Books / newspapers / magazines / loose paper removed Cologne, aftershave, perfume and deodorant removed Jewelry removed (may wrap wedding band) NA Make-up removed NA Hair care products removed Battery operated devices (external) removed Heating patches and chemical warmers removed Titanium eyewear removed Nail polish cured greater than 10 hours NA Casting material cured greater than 10 hours NA Hearing aids  removed NA Loose dentures or partials removed NA Prosthetics have been removed NA Patient demonstrates correct use of air break device (if applicable) Patient concerns have been addressed Patient grounding bracelet on and cord attached to chamber Specifics for Inpatients (complete in addition to above) Medication sheet sent with patient NA Intravenous medications  needed or due during therapy sent with patient NA Drainage tubes (e.g. nasogastric tube or chest tube secured and vented) NA Endotracheal or Tracheotomy tube secured NA Cuff deflated of air and inflated with saline NA Airway suctioned NA Notes The safety checklist was done before [name] was started. Electronic Signature(s) Signed: 06/26/2022 11:14:31 AM By: Jimmy [name] EMT Entered By: Jimmy [name] on 06/26/2022 11:14:31

## 2022-06-26 NOTE — Progress Notes (Signed)
DEONTRAY, HUNNICUTT ([id number]) Visit Report for 06/26/2022 Problem List Details Patient Name: Date of Service: [name] 06/26/2022 10:00 A M Medical Record Number: [id number] Patient Account Number: [id number] Date of Birth/Sex: Treating RN: 15-May-1966 (56 y.o. 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 06/26/2022 Medical Record Number: [id number] Patient Account Number: [id number] Date of Birth/Sex: Treating RN: 20-Apr-1966 (56 y.o. 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, unspecified Z21 Asymptomatic human immunodeficiency virus [HIV] infection status K26.0 Acute duodenal ulcer with hemorrhage Facility  Procedures CPT4 Code: 82505397 Description: G0277-(Facility Use Only) HBOT full body chamber, , ICD-10 Diagnosis Description M86.68 Other chronic osteomyelitis, other site Z87.828 Personal history of other (healed) physical injury and trauma 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,  unspecified Z21 Asymptomatic human immunodeficiency virus [HIV] infection status Modifier: Quantity: 4 Physician Procedures : CPT4 Code Description Modifier [id number] - WC PHYS HYPERBARIC OXYGEN THERAPY ICD-10 Diagnosis Description M86.68 Other chronic osteomyelitis, other site Z87.828 Personal history of other (healed) physical injury and trauma M26.9 Dentofacial  anomaly, unspecified Z21 Asymptomatic human immunodeficiency virus [HIV] infection status Quantity: 1 Electronic Signature(s) Signed: 06/26/2022 1:06:22 PM By: [name] EMT Signed: 06/26/2022 1:41:40 PM By: Geralyn Corwin DO Entered By: [name] on 06/26/2022 13:06:22

## 2022-06-26 NOTE — Progress Notes (Addendum)
VARIAN, INNES ([id number]) Visit Report for 06/26/2022 Arrival Information Details Patient Name: Date of Service: [name] 06/26/2022 10:00 A M Medical Record Number: [id number] Patient Account Number: [id number] Date of Birth/Sex: Treating RN: Jul 15, 1966 (56 y.o. [name] Primary Care Jaiveon Suppes: [name] Other Clinician: [name] Referring Maryjean Corpening: Treating Eyla Tallon/Extender: [name] in Treatment: 9 Visit Information History Since Last Visit All ordered tests and consults were completed: Yes Patient Arrived: Ambulatory Added or deleted any medications: No Arrival Time: 09:15 Any new allergies or adverse reactions: No Accompanied By: Wife Had a fall or experienced change in No Transfer Assistance: None activities of daily living that may affect Patient Identification Verified: Yes risk of falls: Secondary Verification Process Completed: Yes Signs or symptoms of abuse/neglect since last visito No Patient Requires Transmission-Based Precautions: No Hospitalized since last visit: No Patient Has Alerts: No Implantable device outside of the clinic excluding No cellular tissue based products placed in the center since last visit: Pain Present Now: No Electronic Signature(s) Signed: 06/26/2022 11:10:02 AM By: [name] EMT Entered By: [name] on 06/26/2022 11:10:01 -------------------------------------------------------------------------------- Encounter Discharge Information Details Patient Name: Date of Service: [name], [name] 06/26/2022 10:00 A M Medical Record Number: [id number] Patient Account Number: [id number] Date of Birth/Sex: Treating RN: 04-07-1966 (56 y.o. [name] Primary Care Analiyah Lechuga: [name] Other Clinician: [name] Referring Venus Gilles: Treating Denis Koppel/Extender: [name] in Treatment: 9 Encounter Discharge Information  Items Discharge Condition: Stable Ambulatory Status: Ambulatory Discharge Destination: Home Transportation: Private Auto Accompanied By: Wife Schedule Follow-up Appointment: Yes Clinical Summary of Care: Electronic Signature(s) Signed: 06/26/2022 1:07:00 PM By: [name] EMT Entered By: [name] on 06/26/2022 13:07:00 -------------------------------------------------------------------------------- Vitals Details Patient Name: Date of Service: [name] 06/26/2022 10:00 A M Medical Record Number: [id number] Patient Account Number: [id number] Date of Birth/Sex: Treating RN: [birth year] (56 y.o. [name] Primary Care Londen Bok: [name] Other Clinician: [name] Referring Larosa Rhines: Treating Deania Siguenza/Extender: [name] in Treatment: 9 Vital Signs Time Taken: 09:59 Temperature (F): 97.3 Height (in): 71 Pulse (bpm): 75 Weight (lbs): 190 Respiratory Rate (breaths/min): 16 Body Mass Index (BMI): 26.5 Blood Pressure (mmHg): 122/71 Reference Range: 80 - 120 mg / dl Electronic Signature(s) Signed: 06/26/2022 11:10:31 AM By: [name] EMT Entered By: [name] on 06/26/2022 11:10:30

## 2022-06-29 ENCOUNTER — Encounter (HOSPITAL_BASED_OUTPATIENT_CLINIC_OR_DEPARTMENT_OTHER): Payer: BC Managed Care – PPO | Admitting: Internal Medicine

## 2022-06-29 DIAGNOSIS — M269 Dentofacial anomaly, unspecified: Secondary | ICD-10-CM

## 2022-06-29 DIAGNOSIS — Z87828 Personal history of other (healed) physical injury and trauma: Secondary | ICD-10-CM

## 2022-06-29 DIAGNOSIS — M8668 Other chronic osteomyelitis, other site: Secondary | ICD-10-CM | POA: Diagnosis not present

## 2022-06-29 DIAGNOSIS — Z21 Asymptomatic human immunodeficiency virus [HIV] infection status: Secondary | ICD-10-CM | POA: Diagnosis not present

## 2022-06-29 DIAGNOSIS — K260 Acute duodenal ulcer with hemorrhage: Secondary | ICD-10-CM | POA: Diagnosis not present

## 2022-06-30 ENCOUNTER — Encounter (HOSPITAL_BASED_OUTPATIENT_CLINIC_OR_DEPARTMENT_OTHER): Payer: BC Managed Care – PPO | Admitting: Internal Medicine

## 2022-06-30 DIAGNOSIS — Z21 Asymptomatic human immunodeficiency virus [HIV] infection status: Secondary | ICD-10-CM

## 2022-06-30 DIAGNOSIS — Z87828 Personal history of other (healed) physical injury and trauma: Secondary | ICD-10-CM

## 2022-06-30 DIAGNOSIS — M8668 Other chronic osteomyelitis, other site: Secondary | ICD-10-CM | POA: Diagnosis not present

## 2022-06-30 DIAGNOSIS — K260 Acute duodenal ulcer with hemorrhage: Secondary | ICD-10-CM | POA: Diagnosis not present

## 2022-06-30 DIAGNOSIS — M269 Dentofacial anomaly, unspecified: Secondary | ICD-10-CM | POA: Diagnosis not present

## 2022-07-01 ENCOUNTER — Encounter (HOSPITAL_BASED_OUTPATIENT_CLINIC_OR_DEPARTMENT_OTHER): Payer: BC Managed Care – PPO | Admitting: General Surgery

## 2022-07-01 DIAGNOSIS — Z21 Asymptomatic human immunodeficiency virus [HIV] infection status: Secondary | ICD-10-CM | POA: Diagnosis not present

## 2022-07-01 DIAGNOSIS — K260 Acute duodenal ulcer with hemorrhage: Secondary | ICD-10-CM | POA: Diagnosis not present

## 2022-07-01 DIAGNOSIS — Z87828 Personal history of other (healed) physical injury and trauma: Secondary | ICD-10-CM | POA: Diagnosis not present

## 2022-07-01 DIAGNOSIS — M269 Dentofacial anomaly, unspecified: Secondary | ICD-10-CM | POA: Diagnosis not present

## 2022-07-01 DIAGNOSIS — M8668 Other chronic osteomyelitis, other site: Secondary | ICD-10-CM | POA: Diagnosis not present

## 2022-07-01 NOTE — Progress Notes (Signed)
TORIAN, QUINTERO ([id number]) Visit Report for 06/30/2022 Arrival Information Details Patient Name: Date of Service: [name] 06/30/2022 10:00 A M Medical Record Number: [id number] Patient Account Number: [id number] Date of Birth/Sex: Treating RN: 1966-03-11 (56 y.o. [name] Primary Care Lorna Strother: [name] Other Clinician: [name] Referring Betsabe Iglesia: Treating Kaydan Wong/Extender: [name] in Treatment: 9 Visit Information History Since Last Visit All ordered tests and consults were completed: Yes Patient Arrived: Ambulatory Added or deleted any medications: No Arrival Time: 09:27 Any new allergies or adverse reactions: No Accompanied By: Wife Had a fall or experienced change in No Transfer Assistance: None activities of daily living that may affect Patient Identification Verified: Yes risk of falls: Secondary Verification Process Completed: Yes Signs or symptoms of abuse/neglect since last visito No Patient Requires Transmission-Based Precautions: No Hospitalized since last visit: No Patient Has Alerts: No Pain Present Now: No Electronic Signature(s) Signed: 06/30/2022 3:37:58 PM By: [name] EMT Entered By: [name] on 06/30/2022 15:37:58 -------------------------------------------------------------------------------- Encounter Discharge Information Details Patient Name: Date of Service: [name], [name] 06/30/2022 10:00 A M Medical Record Number: [id number] Patient Account Number: [id number] Date of Birth/Sex: Treating RN: 24-Apr-1966 (56 y.o. [name] Primary Care [name]: [name] Other Clinician: [name] Referring Arville Postlewaite: Treating Ercell Razon/Extender: [name] in Treatment: 9 Encounter Discharge Information Items Discharge Condition: Stable Ambulatory Status: Ambulatory Discharge Destination: Home Transportation: Private  Auto Accompanied By: Wife Schedule Follow-up Appointment: Yes Clinical Summary of Care: Electronic Signature(s) Signed: 06/30/2022 3:41:42 PM By: [name] EMT Entered By: [name] on 06/30/2022 15:41:42 -------------------------------------------------------------------------------- Vitals Details Patient Name: Date of Service: [name] 06/30/2022 10:00 A M Medical Record Number: [id number] Patient Account Number: [id number] Date of Birth/Sex: Treating RN: [birth year] (56 y.o. [name] Primary Care Dayanira Giovannetti: [name] Other Clinician: [name] Referring Shandricka Monroy: Treating Shakti Fleer/Extender: [name] in Treatment: 9 Vital Signs Time Taken: 09:44 Temperature (F): 98.4 Height (in): 71 Pulse (bpm): 86 Weight (lbs): 190 Respiratory Rate (breaths/min): 16 Body Mass Index (BMI): 26.5 Blood Pressure (mmHg): 120/72 Reference Range: 80 - 120 mg / dl Electronic Signature(s) Signed: 06/30/2022 3:39:37 PM By: [name] EMT Previous Signature: 06/30/2022 3:38:26 PM Version By: [name] EMT Entered By: [name] on 06/30/2022 15:39:37

## 2022-07-01 NOTE — Progress Notes (Addendum)
Jimmy [name], Jimmy [name] ([id number]) Visit Report for 06/30/2022 HBO Details Patient Name: Date of Service: [name] 06/30/2022 10:00 A M Medical Record Number: [id number] Patient Account Number: [id number] Date of Birth/Sex: Treating RN: Sep 22, 1966 (56 y.o. 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 HBO Treatment Details Treatment Number: 38 Patient Type: Outpatient Chamber Type: Monoplace Chamber Serial #: [id number] Treatment Protocol: 2.0 ATA with 90 minutes oxygen, with two 5 minute air breaks Treatment Details Compression Rate Down: 2.0 psi / minute De-Compression Rate Up: 2.0 psi / minute A breaks and breathing ir Compress Tx Pressure periods Decompress Decompress Begins Reached (leave unused spaces Begins Ends blank) Chamber Pressure (ATA 1 2 2 2 2 2  --2 1 ) Clock Time (24 hr) 09:57 10:06 10:36 10:41 11:11 11:16 - - 11:46 11:55 Treatment Length: 118 (minutes) Treatment Segments: 4 Vital Signs Capillary Blood Glucose Reference Range: 80 - 120 mg / dl HBO Diabetic Blood Glucose Intervention Range: <131 mg/dl or mg/dl Time Vitals Blood Respiratory Capillary Blood Glucose Pulse Action Type: Pulse: Temperature: Taken: Pressure: Rate: Glucose (mg/dl): Meter #: Oximetry (%) Taken: Pre 09:44 120/72 86 16 98.4 Post 11:56 126/79 79 16 98.2 Treatment Response Treatment Toleration: Well Treatment Completion Status: Treatment Completed without Adverse Event Physician HBO Attestation: I certify that I supervised this HBO treatment in accordance with Medicare guidelines. A trained emergency  response team is readily available per Yes hospital policies and procedures. Continue HBOT as ordered. Yes Electronic Signature(s) Signed: 06/30/2022 4:20:15 PM By: 07/02/2022 DO Previous Signature: 06/30/2022 3:40:39 PM Version By: 07/02/2022 EMT Previous Signature: 06/30/2022 3:39:47 PM Version By: 07/02/2022 EMT Previous Signature: 06/30/2022 3:39:13 PM Version By: 07/02/2022 EMT Previous Signature: 06/30/2022 3:37:12 PM Version By: 07/02/2022 EMT Entered By: [name] on 06/30/2022 15:57:50 -------------------------------------------------------------------------------- HBO Safety Checklist Details Patient Name: Date of Service: 07/02/2022, Jimmy [name] [name] 06/30/2022 10:00 A M Medical Record Number: 07/02/2022 Patient Account Number: [id number] Date of Birth/Sex: Treating RN: 1966/05/17 (55 y.o. 53 Primary Care Gerell Fortson: [name] Other Clinician: [name] Referring Mishon Blubaugh: Treating Valera Vallas/Extender: [name] in Treatment: 9 HBO Safety Checklist Items Safety Checklist Consent Form Signed Patient voided / foley secured and emptied When did you last eato 0630 Last dose of injectable or oral agent NA Ostomy pouch emptied and vented if applicable NA All implantable devices assessed, documented and approved NA Intravenous access site secured and place NA Valuables secured Linens and cotton and cotton/polyester blend (less than 51% polyester) Personal oil-based products / skin lotions / body lotions removed Wigs or hairpieces removed NA Smoking or tobacco materials removed Books / newspapers / magazines / loose paper removed Cologne, aftershave, perfume and deodorant removed Jewelry removed (may wrap wedding band) NA Make-up removed NA Hair care products removed Battery operated devices (external) removed Heating patches and chemical warmers removed Titanium eyewear removed NA Nail polish  cured greater than 10 hours NA Casting material cured greater than 10 hours NA Hearing aids removed NA Loose dentures or partials removed NA Prosthetics have been removed NA Patient demonstrates correct use of air break device (if applicable) Patient concerns have been addressed Patient grounding bracelet on and cord attached to chamber  Specifics for Inpatients (complete in addition to above) Medication sheet sent with patient NA Intravenous medications needed or due during therapy sent with patient NA Drainage tubes (e.g. nasogastric tube or chest tube secured and vented) NA Endotracheal or Tracheotomy tube secured NA Cuff deflated of air and inflated with saline NA Airway suctioned NA Notes The safety checklist was done before the treatment was started. Electronic Signature(s) Signed: 06/30/2022 3:38:51 PM By: [name] EMT Previous Signature: 06/30/2022 3:35:24 PM Version By: [name] EMT Entered By: [name] on 06/30/2022 15:38:51

## 2022-07-01 NOTE — Progress Notes (Signed)
Jimmy [name], Jimmy [name] ([id number]) Visit Report for 06/30/2022 Problem List Details Patient Name: Date of Service: [name] 06/30/2022 10:00 A M Medical Record Number: [id number] Patient Account Number: [id number] Date of Birth/Sex: Treating RN: 04/08/1966 (56 y.o. 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 06/30/2022 Medical Record Number: [id number] Patient Account Number: [id number] Date of Birth/Sex: Treating RN: November 04, 1966 (56 y.o. 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, unspecified Z21 Asymptomatic human immunodeficiency virus [HIV] infection status K26.0 Acute duodenal ulcer with  hemorrhage Facility Procedures CPT4 Code: [id number] Description: G0277-(Facility Use Only) HBOT full body chamber, , ICD-10 Diagnosis Description M86.68 Other chronic osteomyelitis, other site Z87.828 Personal history of other (healed) physical injury and trauma 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,  unspecified Z21 Asymptomatic human immunodeficiency virus [HIV] infection status Modifier: Quantity: 4 Physician Procedures : CPT4 Code Description Modifier [id number] 03546 - WC PHYS HYPERBARIC OXYGEN THERAPY ICD-10 Diagnosis Description M86.68 Other chronic osteomyelitis, other site Z87.828 Personal history of other (healed) physical injury and trauma M26.9 Dentofacial  anomaly, unspecified Z21 Asymptomatic human immunodeficiency virus [HIV] infection status Quantity: 1 Electronic Signature(s) Signed: 06/30/2022 3:41:05 PM By: [name] EMT Signed: 06/30/2022 4:20:15 PM By: Geralyn Corwin DO Entered By: [name] on 06/30/2022 15:41:05

## 2022-07-02 ENCOUNTER — Encounter (HOSPITAL_BASED_OUTPATIENT_CLINIC_OR_DEPARTMENT_OTHER): Payer: BC Managed Care – PPO | Admitting: General Surgery

## 2022-07-02 ENCOUNTER — Encounter: Payer: Self-pay | Admitting: Infectious Disease

## 2022-07-02 NOTE — Progress Notes (Signed)
Jimmy [name], Jimmy [name] ([id number]) Visit Report for 07/01/2022 HBO Details Patient Name: Date of Service: [name] 07/01/2022 10:00 A M Medical Record Number: [id number] Patient Account Number: [id number] Date of Birth/Sex: Treating RN: 03-21-1966 (56 y.o. 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 HBO Treatment Details Treatment Number: 39 Patient Type: Outpatient Chamber Type: Monoplace Chamber Serial #: [id number] Treatment Protocol: 2.0 ATA with 90 minutes oxygen, with two 5 minute air breaks Treatment Details Compression Rate Down: 2.0 psi / minute De-Compression Rate Up: 2.0 psi / minute A breaks and breathing ir Compress Tx Pressure periods Decompress Decompress Begins Reached (leave unused spaces Begins Ends blank) Chamber Pressure (ATA 1 2 2 2 2 2  --2 1 ) Clock Time (24 hr) 10:12 10:21 10:51 10:56 11:26 11:31 - - 12:01 12:09 Treatment Length: 117 (minutes) Treatment Segments: 4 Vital Signs Capillary Blood Glucose Reference Range: 80 - 120 mg / dl HBO Diabetic Blood Glucose Intervention Range: <131 mg/dl or mg/dl Type: Time Vitals Blood Respiratory Capillary Blood Glucose Pulse Action Pulse: Temperature: Taken: Pressure: Rate: Glucose (mg/dl): Meter #: Oximetry (%) Taken: Pre 10:10 122/78 80 20 98.1 none per protocol Post 12:11 130/80 74 18 97.9 none per protocol Treatment Response Treatment Toleration: Well Treatment Completion Status: Treatment Completed without Adverse Event Physician HBO Attestation: I certify that I supervised this HBO treatment in accordance with  Medicare guidelines. A trained emergency response team is readily available per Yes hospital policies and procedures. Continue HBOT as ordered. Yes Electronic Signature(s) Signed: 07/01/2022 2:44:01 PM By: 07/03/2022 MD FACS Previous Signature: 07/01/2022 2:37:24 PM Version By: 07/03/2022 CHT EMT BS , , Entered By: [name] on 07/01/2022 14:44:01 -------------------------------------------------------------------------------- HBO Safety Checklist Details Patient Name: Date of Service: 07/03/2022 [name] 07/01/2022 10:00 A M Medical Record Number: 07/03/2022 Patient Account Number: [id number] Date of Birth/Sex: Treating RN: October 09, 1966 (56 y.o. 54, Jimmy [name] Primary Care Jimmy [name]: [name] Other Clinician: [name] Referring Jimmy [name]: Treating Jimmy [name]/Extender: [name] in Treatment: 9 HBO Safety Checklist Items Safety Checklist Consent Form Signed Patient voided / foley secured and emptied When did you last eato 0800 Last dose of injectable or oral agent n/a Ostomy pouch emptied and vented if applicable NA All implantable devices assessed, documented and approved NA Intravenous access site secured and place NA Valuables secured Linens and cotton and cotton/polyester blend (less than 51% polyester) Personal oil-based products / skin lotions / body lotions removed Wigs or hairpieces removed NA Smoking or tobacco materials removed NA Books / newspapers / magazines / loose paper removed Cologne, aftershave, perfume and deodorant removed Jewelry removed (may wrap wedding band) Make-up removed NA Hair care products removed Battery operated devices (external) removed Heating patches and chemical warmers removed Titanium eyewear removed NA Nail polish cured greater than 10 hours NA Casting material cured greater than 10 hours NA Hearing aids removed NA Loose dentures or partials removed NA Prosthetics  have been removed NA Patient demonstrates correct use of air break device (if applicable) Patient concerns have been addressed Patient grounding bracelet on and cord attached to chamber Specifics for Inpatients (complete in addition to above) Medication sheet sent with patient NA Intravenous medications needed or due  during therapy sent with patient NA Drainage tubes (e.g. nasogastric tube or chest tube secured and vented) NA Endotracheal or Tracheotomy tube secured NA Cuff deflated of air and inflated with saline NA Airway suctioned NA Notes Paper version used prior to treatment Electronic Signature(s) Signed: 07/01/2022 2:36:20 PM By: [name] CHT EMT BS , , Entered By: [name] on 07/01/2022 14:36:19

## 2022-07-02 NOTE — Progress Notes (Signed)
EMANNUEL, VISE ([id number]) Visit Report for 07/01/2022 SuperBill Details Patient Name: Date of Service: [name] 07/01/2022 Medical Record Number: [id number] Patient Account Number: [id number] Date of Birth/Sex: Treating RN: [birth year] (56 y.o. 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, unspecified Z21 Asymptomatic human immunodeficiency virus [HIV] infection status K26.0 Acute duodenal ulcer with hemorrhage Facility Procedures CPT4 Code Description Modifier Quantity [id number]-(Facility Use Only) HBOT full body chamber, , 4 ICD-10 Diagnosis Description M86.68 Other chronic osteomyelitis, other site Z87.828 Personal history of other (healed) physical injury and trauma 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

## 2022-07-02 NOTE — Progress Notes (Signed)
DERWOOD, BECRAFT ([id number]) Visit Report for 07/01/2022 Arrival Information Details Patient Name: Date of Service: Jimmy [name]. 07/01/2022 10:00 A M Medical Record Number: [id number] Patient Account Number: [id number] Date of Birth/Sex: Treating RN: October 17, 1966 (56 y.o. [name], [name] Primary Care Hollyann Pablo: [name] Other Clinician: [name] Referring Zed Wanninger: Treating Awilda Covin/Extender: [name] in Treatment: 9 Visit Information History Since Last Visit All ordered tests and consults were completed: Yes Patient Arrived: Ambulatory Added or deleted any medications: No Arrival Time: 09:18 Any new allergies or adverse reactions: No Accompanied By: spouse Had a fall or experienced change in No Transfer Assistance: None activities of daily living that may affect Patient Identification Verified: Yes risk of falls: Secondary Verification Process Completed: Yes Signs or symptoms of abuse/neglect since last visito No Patient Requires Transmission-Based Precautions: No Hospitalized since last visit: No Patient Has Alerts: No Implantable device outside of the clinic excluding No cellular tissue based products placed in the center since last visit: Pain Present Now: No Electronic Signature(s) Signed: 07/01/2022 2:39:21 PM By: [name] CHT EMT BS , , Previous Signature: 07/01/2022 2:32:03 PM Version By: [name] CHT EMT BS , , Entered By: [name] on 07/01/2022 14:39:21 -------------------------------------------------------------------------------- Encounter Discharge Information Details Patient Name: Date of Service: Jimmy [name], Jimmy Birks [name] 07/01/2022 10:00 A M Medical Record Number: [id number] Patient Account Number: [id number] Date of Birth/Sex: Treating RN: [birth year] (56 y.o. [name] Primary Care Jeorge Reister: [name] Other Clinician: [name] Referring Levie Wages: Treating  Zurie Platas/Extender: [name] in Treatment: 9 Encounter Discharge Information Items Discharge Condition: Stable Ambulatory Status: Ambulatory Discharge Destination: Home Transportation: Private Auto Accompanied By: spouse Schedule Follow-up Appointment: No Clinical Summary of Care: Electronic Signature(s) Signed: 07/01/2022 2:38:47 PM By: [name] CHT EMT BS , , Entered By: [name] on 07/01/2022 14:38:47 -------------------------------------------------------------------------------- Vitals Details Patient Name: Date of Service: Jimmy Nian [name] 07/01/2022 10:00 A M Medical Record Number: [id number] Patient Account Number: [id number] Date of Birth/Sex: Treating RN: December 13, 1965 (56 y.o. [name], [name] Primary Care Dmiyah Liscano: [name] Other Clinician: [name] Referring [name]: Treating Nakina Spatz/Extender: [name] in Treatment: 9 Vital Signs Time Taken: 10:10 Temperature (F): 98.1 Height (in): 71 Pulse (bpm): 80 Weight (lbs): 190 Respiratory Rate (breaths/min): 20 Body Mass Index (BMI): 26.5 Blood Pressure (mmHg): 122/78 Reference Range: 80 - 120 mg / dl Electronic Signature(s) Signed: 07/01/2022 2:32:38 PM By: [name] CHT EMT BS , , Entered By: [name] on 07/01/2022 14:32:38

## 2022-07-03 ENCOUNTER — Encounter (HOSPITAL_BASED_OUTPATIENT_CLINIC_OR_DEPARTMENT_OTHER): Payer: BC Managed Care – PPO | Admitting: General Surgery

## 2022-07-03 ENCOUNTER — Encounter (HOSPITAL_BASED_OUTPATIENT_CLINIC_OR_DEPARTMENT_OTHER): Payer: BC Managed Care – PPO | Admitting: Internal Medicine

## 2022-07-03 ENCOUNTER — Telehealth: Payer: Self-pay

## 2022-07-03 DIAGNOSIS — M8668 Other chronic osteomyelitis, other site: Secondary | ICD-10-CM

## 2022-07-03 DIAGNOSIS — K260 Acute duodenal ulcer with hemorrhage: Secondary | ICD-10-CM | POA: Diagnosis not present

## 2022-07-03 DIAGNOSIS — M269 Dentofacial anomaly, unspecified: Secondary | ICD-10-CM

## 2022-07-03 DIAGNOSIS — Z21 Asymptomatic human immunodeficiency virus [HIV] infection status: Secondary | ICD-10-CM | POA: Diagnosis not present

## 2022-07-03 DIAGNOSIS — Z87828 Personal history of other (healed) physical injury and trauma: Secondary | ICD-10-CM | POA: Diagnosis not present

## 2022-07-03 MED ORDER — CEFADROXIL 500 MG PO CAPS: 1000.0000 mg | ORAL_CAPSULE | Freq: Two times a day (BID) | ORAL | 11 refills | Status: DC

## 2022-07-03 MED ORDER — METRONIDAZOLE 500 MG PO TABS
500.0000 mg | ORAL_TABLET | Freq: Two times a day (BID) | ORAL | 5 refills | Status: DC
Start: 2022-07-03 — End: 2022-07-29

## 2022-07-03 NOTE — Progress Notes (Signed)
Jimmy [name], Jimmy [name] ([id number]) Visit Report for 07/03/2022 Arrival Information Details Patient Name: Date of Service: [name] 07/03/2022 10:00 A M Medical Record Number: [id number] Patient Account Number: [id number] Date of Birth/Sex: Treating RN: 1966/05/11 (56 y.o. Jimmy [name] Primary Care Kristion Holifield: [name] Other Clinician: Referring Jalayiah Bibian: Treating Shandie Bertz/Extender: [name] in Treatment: 10 Visit Information History Since Last Visit All ordered tests and consults were completed: Yes Patient Arrived: Ambulatory Added or deleted any medications: No Arrival Time: 09:30 Any new allergies or adverse reactions: No Accompanied By: spouse Had a fall or experienced change in No Transfer Assistance: None activities of daily living that may affect Patient Identification Verified: Yes risk of falls: Secondary Verification Process Completed: Yes Signs or symptoms of abuse/neglect since last visito No Patient Requires Transmission-Based Precautions: No Hospitalized since last visit: No Patient Has Alerts: No Implantable device outside of the clinic excluding No cellular tissue based products placed in the center since last visit: Pain Present Now: No Electronic Signature(s) Signed: 07/03/2022 11:54:22 AM By: [name] CHT EMT BS , , Entered By: [name] on 07/03/2022 11:54:22 -------------------------------------------------------------------------------- Encounter Discharge Information Details Patient Name: Date of Service: Jimmy [name], Jimmy [name] [name] 07/03/2022 10:00 A M Medical Record Number: [id number] Patient Account Number: [id number] Date of Birth/Sex: Treating RN: June 08, 1966 (56 y.o. Jimmy [name] Primary Care Shelda Truby: [name] Other Clinician: [name] Referring Deserie Dirks: Treating Sharunda Salmon/Extender: [name] in Treatment: 10 Encounter Discharge  Information Items Discharge Condition: Stable Ambulatory Status: Ambulatory Discharge Destination: Other (Note Required) Transportation: Other Accompanied By: spouse Schedule Follow-up Appointment: No Clinical Summary of Care: Notes Wound Care Encounter post Tx Electronic Signature(s) Signed: 07/03/2022 12:05:09 PM By: [name] CHT EMT BS , , Entered By: [name] on 07/03/2022 12:05:09 -------------------------------------------------------------------------------- Vitals Details Patient Name: Date of Service: Jimmy [name] [name] 07/03/2022 10:00 A M Medical Record Number: [id number] Patient Account Number: [id number] Date of Birth/Sex: Treating RN: 20-Jan-1966 (56 y.o. Jimmy [name] Primary Care Ayeshia Coppin: [name] Other Clinician: [name] Referring Sevin Langenbach: Treating Yury Schaus/Extender: [name] in Treatment: 10 Vital Signs Time Taken: 09:40 Temperature (F): 98.1 Height (in): 71 Pulse (bpm): 86 Weight (lbs): 190 Respiratory Rate (breaths/min): 24 Body Mass Index (BMI): 26.5 Blood Pressure (mmHg): 139/79 Reference Range: 80 - 120 mg / dl Electronic Signature(s) Signed: 07/03/2022 11:54:48 AM By: [name] CHT EMT BS , , Entered By: [name] on 07/03/2022 11:54:47

## 2022-07-03 NOTE — Telephone Encounter (Signed)
Dr.[name] - patient wife called... she also sent a my chart message. patient orthopedic doctor is deferring abx management to you. patient is currently out of clindamycin   Per Dr.[name]:   thx [name], I have sent in prescriptions for cefadroxil and flagyl to the [facility] in [place] that was on file. Because the pt received cefazolin in the other hospital it should be quite safe for him to take these antibiotics    Patient wife aware RX's sent to [facility] in [place].   Jimmy [name] Lesli Albee, CMA

## 2022-07-03 NOTE — Progress Notes (Signed)
Jimmy [name], Jimmy [name] ([id number]) Visit Report for 07/03/2022 HBO Safety Checklist Details Patient Name: Date of Service: [name] 07/03/2022 10:00 A M Medical Record Number: [id number] Patient Account Number: [id number] Date of Birth/Sex: Treating RN: 03/06/1966 (56 y.o. Jimmy [name] Primary Care Peighton Mehra: [name] Other Clinician: [name] Referring Kandance Yano: Treating Mckynlee Luse/Extender: [name] in Treatment: 10 HBO Safety Checklist Items Safety Checklist Consent Form Signed Patient voided / foley secured and emptied When did you last eato 0800 Last dose of injectable or oral agent n/a Ostomy pouch emptied and vented if applicable NA All implantable devices assessed, documented and approved NA Intravenous access site secured and place NA Valuables secured Linens and cotton and cotton/polyester blend (less than 51% polyester) Personal oil-based products / skin lotions / body lotions removed Wigs or hairpieces removed NA Smoking or tobacco materials removed NA Books / newspapers / magazines / loose paper removed Cologne, aftershave, perfume and deodorant removed Jewelry removed (may wrap wedding band) Make-up removed NA Hair care products removed Battery operated devices (external) removed Heating patches and chemical warmers removed Titanium eyewear removed NA Nail polish cured greater than 10 hours NA Casting material cured greater than 10 hours NA Hearing aids removed NA Loose dentures or partials removed NA Prosthetics have been removed NA Patient demonstrates correct use of air break device (if applicable) Patient concerns have been addressed Patient grounding bracelet on and cord attached to chamber Specifics for Inpatients (complete in addition to above) Medication sheet sent with patient NA Intravenous medications needed or due during therapy sent with patient NA Drainage tubes (e.g.  nasogastric tube or chest tube secured and vented) NA Endotracheal or Tracheotomy tube secured NA Cuff deflated of air and inflated with saline NA Airway suctioned NA Notes Paper version used prior to treatment. Electronic Signature(s) Signed: 07/03/2022 11:55:36 AM By: [name] CHT EMT BS , , Entered By: [name] on 07/03/2022 11:55:36

## 2022-07-03 NOTE — Progress Notes (Signed)
ELHADJI, PECORE ([id number]) Visit Report for 07/03/2022 Arrival Information Details Patient Name: Date of Service: Jimmy [name]. 07/03/2022 12:30 PM Medical Record Number: [id number] Patient Account Number: [id number] Date of Birth/Sex: Treating RN: July 02, 1966 (56 y.o. [name] Primary Care Javaeh Muscatello: [name] Other Clinician: Referring Murad Staples: Treating Paris Hohn/Extender: [name] in Treatment: 10 Visit Information History Since Last Visit Added or deleted any medications: No Patient Arrived: Ambulatory Any new allergies or adverse reactions: No Arrival Time: 11:50 Had a fall or experienced change in No Accompanied By: sig other activities of daily living that may affect Transfer Assistance: None risk of falls: Patient Identification Verified: Yes Signs or symptoms of abuse/neglect since last visito No Patient Requires Transmission-Based Precautions: No Hospitalized since last visit: No Patient Has Alerts: No Implantable device outside of the clinic excluding No cellular tissue based products placed in the center since last visit: Has Dressing in Place as Prescribed: Yes Pain Present Now: No Electronic Signature(s) Signed: 07/03/2022 5:17:06 PM By: Karie Schwalbe RN Entered By: Karie Schwalbe on 07/03/2022 11:50:51 -------------------------------------------------------------------------------- Clinic Level of Care Assessment Details Patient Name: Date of Service: Jimmy [name]. 07/03/2022 12:30 PM Medical Record Number: [id number] Patient Account Number: [id number] Date of Birth/Sex: Treating RN: 09-Mar-1966 (56 y.o. [name] Primary Care Derrian Poli: [name] Other Clinician: Referring Maureena Dabbs: Treating Woodrow Dulski/Extender: [name] in Treatment: 10 Clinic Level of Care Assessment Items TOOL 1 Quantity Score X- 1 0 Use when EandM and Procedure is performed on INITIAL  visit ASSESSMENTS - Nursing Assessment / Reassessment X- 1 20 General Physical Exam (combine w/ comprehensive assessment (listed just below) when performed on new pt. evals) X- 1 25 Comprehensive Assessment (HX, ROS, Risk Assessments, Wounds Hx, etc.) ASSESSMENTS - Wound and Skin Assessment / Reassessment []  - 0 Dermatologic / Skin Assessment (not related to wound area) ASSESSMENTS - Ostomy and/or Continence Assessment and Care []  - 0 Incontinence Assessment and Management []  - 0 Ostomy Care Assessment and Management (repouching, etc.) PROCESS - Coordination of Care X - Simple Patient / Family Education for ongoing care 1 15 []  - 0 Complex (extensive) Patient / Family Education for ongoing care X- 1 10 Staff obtains , Records, T Results / Process Orders est []  - 0 Staff telephones HHA, Nursing Homes / Clarify orders / etc []  - 0 Routine Transfer to another Facility (non-emergent condition) []  - 0 Routine Hospital Admission (non-emergent condition) []  - 0 New Admissions / / Ordering NPWT Apligraf, etc. , []  - 0 Emergency Hospital Admission (emergent condition) PROCESS - Special Needs []  - 0 Pediatric / Minor Patient Management []  - 0 Isolation Patient Management []  - 0 Hearing / Language / Visual special needs []  - 0 Assessment of Community assistance (transportation, D/C planning, etc.) []  - 0 Additional assistance / Altered mentation []  - 0 Support Surface(s) Assessment (bed, cushion, seat, etc.) INTERVENTIONS - Miscellaneous []  - 0 External ear exam []  - 0 Patient Transfer (multiple staff / / Similar devices) []  - 0 Simple Staple / Suture removal (25 or less) []  - 0 Complex Staple / Suture removal (26 or more) []  - 0 Hypo/Hyperglycemic Management (do not check if billed separately) []  - 0 Ankle / Brachial Index (ABI) - do not check if billed separately Has the patient been seen at the hospital within the last  three years: Yes Total Score: 70 Level Of Care: New/Established - Level 2 Electronic Signature(s) Signed:  07/03/2022 5:17:06 PM By: Karie Schwalbe RN Entered By: Karie Schwalbe on 07/03/2022 17:15:59 -------------------------------------------------------------------------------- Encounter Discharge Information Details Patient Name: Date of Service: Jimmy [name], Jimmy Birks [name] 07/03/2022 12:30 PM Medical Record Number: [id number] Patient Account Number: [id number] Date of Birth/Sex: Treating RN: May 22, 1966 (56 y.o. [name] Primary Care Renelda Kilian: [name] Other Clinician: Referring Jaliel Deavers: Treating Kashena Novitski/Extender: [name] in Treatment: 10 Encounter Discharge Information Items Discharge Condition: Stable Ambulatory Status: Ambulatory Discharge Destination: Home Transportation: Private Auto Accompanied By: self Schedule Follow-up Appointment: Yes Clinical Summary of Care: Patient Declined Electronic Signature(s) Signed: 07/03/2022 5:17:06 PM By: Karie Schwalbe RN Entered By: Karie Schwalbe on 07/03/2022 17:16:28 -------------------------------------------------------------------------------- Lower Extremity Assessment Details Patient Name: Date of Service: Jimmy Nian [name] 07/03/2022 12:30 PM Medical Record Number: [id number] Patient Account Number: [id number] Date of Birth/Sex: Treating RN: 1965-12-27 (56 y.o. [name] Primary Care Kaiesha Tonner: [name] Other Clinician: Referring Andres Bantz: Treating Quinlan Vollmer/Extender: [name] in Treatment: 10 Electronic Signature(s) Signed: 07/03/2022 5:17:06 PM By: Karie Schwalbe RN Entered By: Karie Schwalbe on 07/03/2022 11:51:31 -------------------------------------------------------------------------------- Multi Wound Chart Details Patient Name: Date of Service: Jimmy [name], Jimmy Birks [name] 07/03/2022 12:30 PM Medical Record Number: [id number] Patient  Account Number: [id number] Date of Birth/Sex: Treating RN: 06-Mar-1966 (56 y.o. M) Primary Care Taydon Nasworthy: [name] Other Clinician: Referring Jadarius Commons: Treating Jan Olano/Extender: [name] in Treatment: 10 Vital Signs Height(in): 71 Pulse(bpm): 80 Weight(lbs): 190 Blood Pressure(mmHg): 130/84 Body Mass Index(BMI): 26.5 Temperature(F): 97.9 Respiratory Rate(breaths/min): 18 Photos: [N/A:N/A] Right, Lateral Neck N/A N/A Wound Location: Surgical Injury N/A N/A Wounding Event: Abscess N/A N/A Primary Etiology: Deep Vein Thrombosis, Hypertension N/A N/A Comorbid History: 03/03/2022 N/A N/A Date Acquired: 0 N/A N/A Weeks of Treatment: Open N/A N/A Wound Status: No N/A N/A Wound Recurrence: 0.3x0.3x0.1 N/A N/A Measurements L x W x D (cm) 0.071 N/A N/A A (cm) : rea 0.007 N/A N/A Volume (cm) : 0.00% N/A N/A % Reduction in A rea: 0.00% N/A N/A % Reduction in Volume: Full Thickness Without Exposed N/A N/A Classification: Support Structures Medium N/A N/A Exudate Amount: Serous N/A N/A Exudate Type: amber N/A N/A Exudate Color: Small (1-33%) N/A N/A Granulation Amount: Pink N/A N/A Granulation Quality: Large (67-100%) N/A N/A Necrotic Amount: Eschar, Adherent Slough N/A N/A Necrotic Tissue: Fat Layer (Subcutaneous Tissue): Yes N/A N/A Exposed Structures: Fascia: No Tendon: No Muscle: No Joint: No Bone: No None N/A N/A Epithelialization: Treatment Notes Electronic Signature(s) Signed: 07/03/2022 12:24:58 PM By: [name] Entered By: [name] on 07/03/2022 12:24:58 -------------------------------------------------------------------------------- Multi-Disciplinary Care Plan Details Patient Name: Date of Service: Jimmy Nian [name] 07/03/2022 12:30 PM Medical Record Number: [id number] Patient Account Number: [id number] Date of Birth/Sex: Treating RN: 06/27/1966 (56 y.o. [name] Primary Care Tome Wilson: [name] Other Clinician: Referring Amreen Raczkowski: Treating Tiya Schrupp/Extender: [name] in Treatment: 10 Active Inactive HBO Nursing Diagnoses: Anxiety related to knowledge deficit of hyperbaric oxygen therapy and treatment procedures Goals: Patient and/or family will be able to state/discuss factors appropriate to the management of their disease process during treatment Date Initiated: 07/03/2022 Target Resolution Date: 10/15/2022 Goal Status: Active Interventions: Assess and provide for patients comfort related to the hyperbaric environment and equalization of middle ear Notes: Wound/Skin Impairment Nursing Diagnoses: Impaired tissue integrity Goals: Patient/caregiver will verbalize understanding of skin care regimen Date Initiated: 04/24/2022 Target Resolution Date: 07/09/2022 Goal Status: Active Interventions: Provide education on ulcer and skin care Treatment Activities:  Skin care regimen initiated : 04/24/2022 Notes: Electronic Signature(s) Signed: 07/03/2022 5:17:06 PM By: Dellie Catholic RN Entered By: Dellie Catholic on 07/03/2022 12:08:41 -------------------------------------------------------------------------------- Pain Assessment Details Patient Name: Date of Service: Jimmy Guise [name] 07/03/2022 12:30 PM Medical Record Number: [id number] Patient Account Number: [id number] Date of Birth/Sex: Treating RN: 1966/11/12 (56 y.o. Collene Gobble Primary Care Satina Jerrell: Greig Right Other Clinician: Referring Laurren Lepkowski: Treating Eunice Winecoff/Extender: Tonette Lederer in Treatment: 10 Active Problems Location of Pain Severity and Description of Pain Patient Has Paino No Site Locations Pain Management and Medication Current Pain Management: Electronic Signature(s) Signed: 07/03/2022 5:17:06 PM By: Dellie Catholic RN Entered By: Dellie Catholic on 07/03/2022  11:51:23 -------------------------------------------------------------------------------- Patient/Caregiver Education Details Patient Name: Date of Service: Jimmy [name] 8/18/2023andnbsp12:30 PM Medical Record Number: [id number] Patient Account Number: [id number] Date of Birth/Gender: Treating RN: January 17, 1966 (56 y.o. Collene Gobble Primary Care Physician: Greig Right Other Clinician: Referring Physician: Treating Physician/Extender: Tonette Lederer in Treatment: 10 Education Assessment Education Provided To: Patient Education Topics Provided Wound/Skin Impairment: Methods: Explain/Verbal Responses: Return demonstration correctly Electronic Signature(s) Signed: 07/03/2022 5:17:06 PM By: Dellie Catholic RN Entered By: Dellie Catholic on 07/03/2022 12:09:08 -------------------------------------------------------------------------------- Wound Assessment Details Patient Name: Date of Service: Jimmy Guise [name] 07/03/2022 12:30 PM Medical Record Number: [id number] Patient Account Number: [id number] Date of Birth/Sex: Treating RN: 1966-07-22 (56 y.o. Collene Gobble Primary Care Justun Anaya: Greig Right Other Clinician: Referring Jkai Arwood: Treating Avabella Wailes/Extender: Tonette Lederer in Treatment: 10 Wound Status Wound Number: 1 Primary Etiology: Abscess Wound Location: Right, Lateral Neck Wound Status: Open Wounding Event: Surgical Injury Comorbid History: Deep Vein Thrombosis, Hypertension Date Acquired: 03/03/2022 Weeks Of Treatment: 0 Clustered Wound: No Photos Wound Measurements Length: (cm) 0.3 Width: (cm) 0.3 Depth: (cm) 0.1 Area: (cm) 0.071 Volume: (cm) 0.007 % Reduction in Area: 0% % Reduction in Volume: 0% Epithelialization: None Tunneling: No Undermining: No Wound Description Classification: Full Thickness Without Exposed Support Structu Exudate Amount: Medium Exudate Type: Serous Exudate  Color: amber res Wound Bed Granulation Amount: Small (1-33%) Exposed Structure Granulation Quality: Pink Fascia Exposed: No Necrotic Amount: Large (67-100%) Fat Layer (Subcutaneous Tissue) Exposed: Yes Necrotic Quality: Eschar, Adherent Slough Tendon Exposed: No Muscle Exposed: No Joint Exposed: No Bone Exposed: No Treatment Notes Wound #1 (Neck) Wound Laterality: Right, Lateral Cleanser Peri-Wound Care Topical Primary Dressing Secondary Dressing Secured With Compression Wrap Compression Stockings Add-Ons Electronic Signature(s) Signed: 07/03/2022 5:17:06 PM By: Dellie Catholic RN Entered By: Dellie Catholic on 07/03/2022 12:00:05 -------------------------------------------------------------------------------- Vitals Details Patient Name: Date of Service: Jimmy Guise [name] 07/03/2022 12:30 PM Medical Record Number: [id number] Patient Account Number: [id number] Date of Birth/Sex: Treating RN: 1966/03/23 (56 y.o. Collene Gobble Primary Care Cannon Quinton: Greig Right Other Clinician: Referring Kingjames Coury: Treating Katesha Eichel/Extender: Tonette Lederer in Treatment: 10 Vital Signs Time Taken: 11:50 Temperature (F): 97.9 Height (in): 71 Pulse (bpm): 80 Weight (lbs): 190 Respiratory Rate (breaths/min): 18 Body Mass Index (BMI): 26.5 Blood Pressure (mmHg): 130/84 Reference Range: 80 - 120 mg / dl Electronic Signature(s) Signed: 07/03/2022 5:17:06 PM By: Dellie Catholic RN Entered By: Dellie Catholic on 07/03/2022 11:51:14

## 2022-07-03 NOTE — Progress Notes (Signed)
ADEWALE, PUCILLO ([id number]) Visit Report for 06/29/2022 Arrival Information Details Patient Name: Date of Service: [name] 06/29/2022 10:00 A M Medical Record Number: [id number] Patient Account Number: [id number] Date of Birth/Sex: Treating RN: 11/11/1966 (56 y.o. [name], [name] Primary Care Lakishia Bourassa: [name] Other Clinician: [name] Referring Garyson Stelly: Treating Sunaina Ferrando/Extender: [name] in Treatment: 9 Visit Information History Since Last Visit All ordered tests and consults were completed: Yes Patient Arrived: Ambulatory Added or deleted any medications: No Arrival Time: 09:34 Any new allergies or adverse reactions: No Accompanied By: spouse Had a fall or experienced change in No Transfer Assistance: None activities of daily living that may affect Patient Identification Verified: Yes risk of falls: Secondary Verification Process Completed: Yes Signs or symptoms of abuse/neglect since last visito No Patient Requires Transmission-Based Precautions: No Hospitalized since last visit: No Patient Has Alerts: No Implantable device outside of the clinic excluding No cellular tissue based products placed in the center since last visit: Pain Present Now: No Electronic Signature(s) Signed: 06/29/2022 12:38:45 PM By: [name] CHT EMT BS , , Entered By: [name] on 06/29/2022 12:38:45 -------------------------------------------------------------------------------- Encounter Discharge Information Details Patient Name: Date of Service: [name], [name] 06/29/2022 10:00 A M Medical Record Number: [id number] Patient Account Number: [id number] Date of Birth/Sex: Treating RN: 19-Jul-1966 (56 y.o. [name] Primary Care Shatasia Cutshaw: [name] Other Clinician: [name] Referring Nachelle Negrette: Treating Darlisa Spruiell/Extender: [name] in Treatment: 9 Encounter Discharge  Information Items Discharge Condition: Stable Ambulatory Status: Ambulatory Discharge Destination: Home Transportation: Private Auto Accompanied By: self Schedule Follow-up Appointment: No Clinical Summary of Care: Electronic Signature(s) Signed: 06/29/2022 12:44:29 PM By: [name] CHT EMT BS , , Entered By: [name] on 06/29/2022 12:44:29 -------------------------------------------------------------------------------- Vitals Details Patient Name: Date of Service: [name] 06/29/2022 10:00 A M Medical Record Number: [id number] Patient Account Number: [id number] Date of Birth/Sex: Treating RN: 1966-02-20 (56 y.o. [name], [name] Primary Care Sanae Willetts: [name] Other Clinician: [name] Referring Leviticus Harton: Treating Serrina Minogue/Extender: [name] in Treatment: 9 Vital Signs Time Taken: 09:42 Temperature (F): 98.2 Height (in): 71 Pulse (bpm): 79 Weight (lbs): 190 Respiratory Rate (breaths/min): 22 Body Mass Index (BMI): 26.5 Blood Pressure (mmHg): 115/73 Reference Range: 80 - 120 mg / dl Electronic Signature(s) Signed: 06/29/2022 12:39:09 PM By: [name] CHT EMT BS , , Entered By: [name] on 06/29/2022 12:39:09
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
## 2022-07-03 NOTE — Progress Notes (Signed)
LEONDRE, TAUL ([id number]) Visit Report for 06/29/2022 SuperBill Details Patient Name: Date of Service: [name] 06/29/2022 Medical Record Number: [id number] Patient Account Number: [id number] Date of Birth/Sex: Treating RN: [birth year] (56 y.o. 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, unspecified Z21 Asymptomatic human immunodeficiency virus [HIV] infection status K26.0 Acute duodenal ulcer with hemorrhage Facility Procedures CPT4 Code Description Modifier Quantity [id number]-(Facility Use Only) HBOT full body chamber, , 4 ICD-10 Diagnosis Description M86.68 Other chronic osteomyelitis, other site Z87.828 Personal history of other (healed) physical injury and trauma 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

## 2022-07-03 NOTE — Progress Notes (Signed)
Jimmy [name], Jimmy [name] ([id number]) Visit Report for 06/29/2022 HBO Details Patient Name: Date of Service: [name] 06/29/2022 10:00 A M Medical Record Number: [id number] Patient Account Number: [id number] Date of Birth/Sex: Treating RN: [birth year] (56 y.o. Jimmy [name], [name] Primary Care Ledger Heindl: [name] Other Clinician: [name] Referring Michelyn Scullin: Treating Nithin Demeo/Extender: [name] in Treatment: 9 HBO Treatment Course Details Treatment Course Number: 1 Ordering Jimmy [name]: Jimmy [name] T Treatments Ordered: otal 60 HBO Treatment Start Date: 05/04/2022 HBO Indication: Chronic Refractory Osteomyelitis to of mandible HBO Treatment Details Treatment Number: 37 Patient Type: Outpatient Chamber Type: Monoplace Chamber Serial #: [id number] Treatment Protocol: 2.0 ATA with 90 minutes oxygen, with two 5 minute air breaks Treatment Details Compression Rate Down: 2.0 psi / minute De-Compression Rate Up: 2.0 psi / minute A breaks and breathing ir Compress Tx Pressure periods Decompress Decompress Begins Reached (leave unused spaces Begins Ends blank) Chamber Pressure (ATA 1 2 2 2 2 2  --2 1 ) Clock Time (24 hr) 09:49 09:58 10:28 10:33 11:03 11:08 - - 11:38 11:46 Treatment Length: 117 (minutes) Treatment Segments: 4 Vital Signs Capillary Blood Glucose Reference Range: 80 - 120 mg / dl HBO Diabetic Blood Glucose Intervention Range: <131 mg/dl or mg/dl Type: Time Vitals Blood Respiratory Capillary Blood Glucose Pulse Action Pulse: Temperature: Taken: Pressure: Rate: Glucose (mg/dl): Meter #: Oximetry (%) Taken: Pre 09:42 115/73 79 22 98.2 none per protocol Post 11:47 116/80 79 18 97.2 none per protocol Treatment Response Treatment Toleration: Well Treatment Completion Status: Treatment Completed without Adverse Event Physician HBO Attestation: I certify that I supervised this HBO treatment in accordance with  Medicare guidelines. A trained emergency response team is readily available per Yes hospital policies and procedures. Continue HBOT as ordered. Yes Electronic Signature(s) Signed: 06/29/2022 3:35:29 PM By: 07/01/2022 DO Previous Signature: 06/29/2022 12:41:31 PM Version By: 07/01/2022 CHT EMT BS , , Entered By: [name] on 06/29/2022 15:34:55 -------------------------------------------------------------------------------- HBO Safety Checklist Details Patient Name: Date of Service: 07/01/2022 [name] 06/29/2022 10:00 A M Medical Record Number: 07/01/2022 Patient Account Number: [id number] Date of Birth/Sex: Treating RN: [birth year] (56 y.o. 53, Jimmy [name] Primary Care Jimmy [name]: [name] Other Clinician: [name] Referring Jimmy [name]: Treating Jimmy [name]/Extender: [name] in Treatment: 9 HBO Safety Checklist Items Safety Checklist Consent Form Signed Patient voided / foley secured and emptied When did you last eato 0900 Last dose of injectable or oral agent n/a Ostomy pouch emptied and vented if applicable NA All implantable devices assessed, documented and approved NA Intravenous access site secured and place NA Valuables secured Linens and cotton and cotton/polyester blend (less than 51% polyester) Personal oil-based products / skin lotions / body lotions removed Wigs or hairpieces removed NA Smoking or tobacco materials removed NA Books / newspapers / magazines / loose paper removed Cologne, aftershave, perfume and deodorant removed Jewelry removed (may wrap wedding band) Make-up removed NA Hair care products removed Battery operated devices (external) removed Heating patches and chemical warmers removed Titanium eyewear removed NA Nail polish cured greater than 10 hours NA Casting material cured greater than 10 hours NA Hearing aids removed NA Loose dentures or partials removed NA Prosthetics  have been removed NA Patient demonstrates correct use of air break device (if applicable) Patient concerns have been addressed Patient grounding bracelet on and cord attached to chamber Specifics for Inpatients (complete in addition to above) Medication sheet sent with patient NA Intravenous medications needed or due during  therapy sent with patient NA Drainage tubes (e.g. nasogastric tube or chest tube secured and vented) NA Endotracheal or Tracheotomy tube secured NA Cuff deflated of air and inflated with saline NA Airway suctioned NA Notes Paper version used prior to treatment. Electronic Signature(s) Signed: 06/29/2022 12:40:09 PM By: [name] CHT EMT BS , , Entered By: [name] on 06/29/2022 12:40:09

## 2022-07-06 ENCOUNTER — Encounter (HOSPITAL_BASED_OUTPATIENT_CLINIC_OR_DEPARTMENT_OTHER): Payer: BC Managed Care – PPO | Admitting: Internal Medicine

## 2022-07-06 DIAGNOSIS — K260 Acute duodenal ulcer with hemorrhage: Secondary | ICD-10-CM | POA: Diagnosis not present

## 2022-07-06 DIAGNOSIS — Z21 Asymptomatic human immunodeficiency virus [HIV] infection status: Secondary | ICD-10-CM | POA: Diagnosis not present

## 2022-07-06 DIAGNOSIS — M8668 Other chronic osteomyelitis, other site: Secondary | ICD-10-CM | POA: Diagnosis not present

## 2022-07-06 DIAGNOSIS — M269 Dentofacial anomaly, unspecified: Secondary | ICD-10-CM

## 2022-07-06 DIAGNOSIS — Z87828 Personal history of other (healed) physical injury and trauma: Secondary | ICD-10-CM | POA: Diagnosis not present

## 2022-07-06 NOTE — Progress Notes (Addendum)
Jimmy [name], Jimmy [name] ([id number]) Visit Report for 07/06/2022 HBO Details Patient Name: Date of Service: Jimmy [name]. 07/06/2022 10:00 A M Medical Record Number: [id number] Patient Account Number: [id number] Date of Birth/Sex: Treating RN: Jul 22, 1966 (56 y.o. 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 HBO Treatment Details Treatment Number: 41 Patient Type: Outpatient Chamber Type: Monoplace Chamber Serial #: [id number] Treatment Protocol: 2.0 ATA with 90 minutes oxygen, with two 5 minute air breaks Treatment Details Compression Rate Down: 2.0 psi / minute De-Compression Rate Up: 2.0 psi / minute A breaks and breathing ir Compress Tx Pressure periods Decompress Decompress Begins Reached (leave unused spaces Begins Ends blank) Chamber Pressure (ATA 1 2 2 2 2 2  --2 1 ) Clock Time (24 hr) 10:06 10:16 10:46 10:51 11:21 11:26 - - 11:56 12:04 Treatment Length: 118 (minutes) Treatment Segments: 4 Vital Signs Capillary Blood Glucose Reference Range: 80 - 120 mg / dl HBO Diabetic Blood Glucose Intervention Range: <131 mg/dl or mg/dl Type: Time Vitals Blood Respiratory Capillary Blood Glucose Pulse Action Pulse: Temperature: Taken: Pressure: Rate: Glucose (mg/dl): Meter #: Oximetry (%) Taken: Pre 09:52 113/71 85 20 97.7 none per protocol Post 12:06 118/83 74 18 98.1 none per protocol Treatment Response Treatment Toleration: Well Treatment Completion Status: Treatment Completed without Adverse Event Treatment Notes Terrion Gencarelli examined tympanic membrane of left ear prior to treatment. Dr. >570- examined  left ear. Tympanic membrane with minimal irritation noted. No hemorrhaging noted Physician HBO Attestation: I certify that I supervised this HBO treatment in accordance with Medicare guidelines. A trained emergency response team is readily available per Yes hospital policies and procedures. Continue HBOT as ordered. Yes Electronic Signature(s) Signed: 07/06/2022 3:19:18 PM By: 07/08/2022 DO Previous Signature: 07/06/2022 2:11:36 PM Version By: 07/08/2022 CHT EMT BS , , Previous Signature: 07/06/2022 2:09:04 PM Version By: 07/08/2022 CHT EMT BS , , Entered By: [name] on 07/06/2022 15:18:00 -------------------------------------------------------------------------------- HBO Safety Checklist Details Patient Name: Date of Service: 07/08/2022 [name] 07/06/2022 10:00 A M Medical Record Number: 07/08/2022 Patient Account Number: [id number] Date of Birth/Sex: Treating RN: 1966/06/07 (56 y.o. 54, Jimmy [name] Primary Care Laurren Lepkowski: [name] Other Clinician: [name] Referring Shalynn Jorstad: Treating Willadean Guyton/Extender: [name] in Treatment: 10 HBO Safety Checklist Items Safety Checklist Consent Form Signed Patient voided / foley secured and emptied When did you last eato 0600 Last dose of injectable or oral agent n/a Ostomy pouch emptied and vented if applicable NA All implantable devices assessed, documented and approved NA Intravenous access site secured and place NA Valuables secured Linens and cotton and cotton/polyester blend (less than 51% polyester) Personal oil-based products / skin lotions / body lotions removed Wigs or hairpieces removed NA Smoking or tobacco materials removed NA Books / newspapers / magazines / loose paper removed Cologne, aftershave, perfume and deodorant removed Jewelry removed (may wrap wedding band) Make-up removed NA Hair care products removed Battery operated devices  (external) removed Heating patches and chemical warmers removed Titanium eyewear removed Nail polish cured greater than 10 hours NA Casting material cured greater than 10 hours NA Hearing aids removed NA Loose dentures or partials removed NA Prosthetics have been removed NA Patient demonstrates correct use of  air break device (if applicable) Patient concerns have been addressed Patient grounding bracelet on and cord attached to chamber Specifics for Inpatients (complete in addition to above) Medication sheet sent with patient NA Intravenous medications needed or due during therapy sent with patient NA Drainage tubes (e.g. nasogastric tube or chest tube secured and vented) NA Endotracheal or Tracheotomy tube secured NA Cuff deflated of air and inflated with saline NA Airway suctioned NA Notes Paper version used prior to treatment. Electronic Signature(s) Signed: 07/06/2022 2:07:49 PM By: [name] CHT EMT BS , , Entered By: [name] on 07/06/2022 14:07:48

## 2022-07-06 NOTE — Progress Notes (Addendum)
CONLEY, DELISLE ([id number]) Visit Report for 07/06/2022 Arrival Information Details Patient Name: Date of Service: Jimmy [name]. 07/06/2022 10:00 A M Medical Record Number: [id number] Patient Account Number: [id number] Date of Birth/Sex: Treating RN: 05-28-1966 (56 y.o. [name], [name] Primary Care Katreena Schupp: [name] Other Clinician: [name] Referring Surah Pelley: Treating Eliot Popper/Extender: [name] in Treatment: 10 Visit Information History Since Last Visit All ordered tests and consults were completed: Yes Patient Arrived: Ambulatory Added or deleted any medications: No Arrival Time: 09:29 Any new allergies or adverse reactions: No Accompanied By: spouse Had a fall or experienced change in No Transfer Assistance: None activities of daily living that may affect Patient Identification Verified: Yes risk of falls: Secondary Verification Process Completed: Yes Signs or symptoms of abuse/neglect since last visito No Patient Requires Transmission-Based Precautions: No Hospitalized since last visit: No Patient Has Alerts: No Implantable device outside of the clinic excluding No cellular tissue based products placed in the center since last visit: Pain Present Now: No Electronic Signature(s) Signed: 07/06/2022 2:04:19 PM By: [name] CHT EMT BS , , Entered By: [name] on 07/06/2022 14:04:19 -------------------------------------------------------------------------------- Encounter Discharge Information Details Patient Name: Date of Service: Jimmy Nian [name] 07/06/2022 10:00 A M Medical Record Number: [id number] Patient Account Number: [id number] Date of Birth/Sex: Treating RN: 1966/11/11 (56 y.o. [name] Primary Care Tamiko Leopard: [name] Other Clinician: [name] Referring Pascale Maves: Treating Cedra Villalon/Extender: [name] in Treatment: 10 Encounter Discharge  Information Items Discharge Condition: Stable Ambulatory Status: Ambulatory Discharge Destination: Home Transportation: Private Auto Accompanied By: spouse Schedule Follow-up Appointment: No Clinical Summary of Care: Electronic Signature(s) Signed: 07/06/2022 2:09:45 PM By: [name] CHT EMT BS , , Entered By: [name] on 07/06/2022 14:09:45 -------------------------------------------------------------------------------- Vitals Details Patient Name: Date of Service: Jimmy Nian [name] 07/06/2022 10:00 A M Medical Record Number: [id number] Patient Account Number: [id number] Date of Birth/Sex: Treating RN: 28-Oct-1966 (56 y.o. [name], [name] Primary Care Donta Fuster: [name] Other Clinician: [name] Referring Wil Slape: Treating Rochella Benner/Extender: [name] in Treatment: 10 Vital Signs Time Taken: 09:52 Temperature (F): 97.7 Height (in): 71 Pulse (bpm): 85 Weight (lbs): 190 Respiratory Rate (breaths/min): 20 Body Mass Index (BMI): 26.5 Blood Pressure (mmHg): 113/71 Reference Range: 80 - 120 mg / dl Electronic Signature(s) Signed: 07/06/2022 2:04:45 PM By: [name] CHT EMT BS , , Entered By: [name] on 07/06/2022 14:04:45

## 2022-07-06 NOTE — Progress Notes (Signed)
HELMUT, HENNON ([id number]) Visit Report for 07/06/2022 SuperBill Details Patient Name: Date of Service: Jimmy [name] 07/06/2022 Medical Record Number: [id number] Patient Account Number: [id number] Date of Birth/Sex: Treating RN: Oct 16, 1966 (57 y.o. 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, unspecified Z21 Asymptomatic human immunodeficiency virus [HIV] infection status K26.0 Acute duodenal ulcer with hemorrhage Facility Procedures CPT4 Code Description Modifier Quantity [id number] G0277-(Facility Use Only) HBOT full body chamber, , 4 ICD-10 Diagnosis Description M86.68 Other chronic osteomyelitis, other site Z87.828 Personal history of other (healed) physical injury and trauma 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

## 2022-07-06 NOTE — Progress Notes (Signed)
JACINTO, KEIL ([id number]) Visit Report for 07/03/2022 SuperBill Details Patient Name: Date of Service: [name] 07/03/2022 Medical Record Number: [id number] Patient Account Number: [id number] Date of Birth/Sex: Treating RN: 1966/10/02 (56 y.o. 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, unspecified Z21 Asymptomatic human immunodeficiency virus [HIV] infection status K26.0 Acute duodenal ulcer with hemorrhage Facility Procedures CPT4 Code Description Modifier Quantity [id number] G0277-(Facility Use Only) HBOT full body chamber, , 4 ICD-10 Diagnosis Description M86.68 Other chronic osteomyelitis, other site Z87.828 Personal history of other (healed) physical injury and trauma 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

## 2022-07-07 ENCOUNTER — Encounter (HOSPITAL_BASED_OUTPATIENT_CLINIC_OR_DEPARTMENT_OTHER): Payer: BC Managed Care – PPO | Admitting: Internal Medicine

## 2022-07-07 DIAGNOSIS — Z21 Asymptomatic human immunodeficiency virus [HIV] infection status: Secondary | ICD-10-CM | POA: Diagnosis not present

## 2022-07-07 DIAGNOSIS — M269 Dentofacial anomaly, unspecified: Secondary | ICD-10-CM | POA: Diagnosis not present

## 2022-07-07 DIAGNOSIS — Z87828 Personal history of other (healed) physical injury and trauma: Secondary | ICD-10-CM | POA: Diagnosis not present

## 2022-07-07 DIAGNOSIS — M8668 Other chronic osteomyelitis, other site: Secondary | ICD-10-CM

## 2022-07-07 DIAGNOSIS — K260 Acute duodenal ulcer with hemorrhage: Secondary | ICD-10-CM | POA: Diagnosis not present

## 2022-07-07 NOTE — Progress Notes (Signed)
DARRON, STUCK ([id number]) Visit Report for 07/07/2022 Problem List Details Patient Name: Date of Service: [name] 07/07/2022 10:00 A M Medical Record Number: [id number] Patient Account Number: [id number] Date of Birth/Sex: Treating RN: [birth year] (56 y.o. 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 07/07/2022 Medical Record Number: [id number] Patient Account Number: [id number] Date of Birth/Sex: Treating RN: 06/22/1966 (56 y.o. 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, unspecified Z21 Asymptomatic human immunodeficiency virus [HIV] infection status K26.0 Acute duodenal ulcer with hemorrhage Facility  Procedures CPT4 Code: [id number] Description: G0277-(Facility Use Only) HBOT full body chamber, , ICD-10 Diagnosis Description M86.68 Other chronic osteomyelitis, other site Z87.828 Personal history of other (healed) physical injury and trauma 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,  unspecified Z21 Asymptomatic human immunodeficiency virus [HIV] infection status Modifier: Quantity: 4 Physician Procedures : CPT4 Code Description Modifier [id number] 45809 - WC PHYS HYPERBARIC OXYGEN THERAPY ICD-10 Diagnosis Description M86.68 Other chronic osteomyelitis, other site Z87.828 Personal history of other (healed) physical injury and trauma M26.9 Dentofacial  anomaly, unspecified Z21 Asymptomatic human immunodeficiency virus [HIV] infection status Quantity: 1 Electronic Signature(s) Signed: 07/07/2022 1:55:01 PM By: [name] EMT Signed: 07/07/2022 3:51:23 PM By: Geralyn Corwin DO Entered By: [name] on 07/07/2022 13:55:01

## 2022-07-07 NOTE — Progress Notes (Signed)
TERIN, CRAGLE ([id number]) Visit Report for 07/07/2022 Arrival Information Details Patient Name: Date of Service: [name] 07/07/2022 10:00 A M Medical Record Number: [id number] Patient Account Number: [id number] Date of Birth/Sex: Treating RN: 1966/09/07 (56 y.o. Charlean Merl, [name] Primary Care Latisa Belay: [name] Other Clinician: [name] Referring Jetta Murray: Treating Christiane Sistare/Extender: [name] in Treatment: 10 Visit Information History Since Last Visit All ordered tests and consults were completed: Yes Patient Arrived: Ambulatory Added or deleted any medications: No Arrival Time: 09:44 Any new allergies or adverse reactions: No Accompanied By: None Had a fall or experienced change in No Transfer Assistance: None activities of daily living that may affect Patient Identification Verified: Yes risk of falls: Secondary Verification Process Completed: Yes Signs or symptoms of abuse/neglect since last visito No Patient Requires Transmission-Based Precautions: No Hospitalized since last visit: No Patient Has Alerts: No Implantable device outside of the clinic excluding No cellular tissue based products placed in the center since last visit: Pain Present Now: No Electronic Signature(s) Signed: 07/07/2022 1:51:25 PM By: [name] EMT Entered By: [name] on 07/07/2022 13:51:25 -------------------------------------------------------------------------------- Encounter Discharge Information Details Patient Name: Date of Service: [name] 07/07/2022 10:00 A M Medical Record Number: [id number] Patient Account Number: [id number] Date of Birth/Sex: Treating RN: 20-Apr-1966 (56 y.o. Charlean Merl, [name] Primary Care Carolyn Maniscalco: [name] Other Clinician: [name] Referring Stormie Ventola: Treating Elbie Statzer/Extender: [name] in Treatment: 10 Encounter Discharge Information  Items Discharge Condition: Stable Ambulatory Status: Ambulatory Discharge Destination: Home Transportation: Private Auto Accompanied By: None Schedule Follow-up Appointment: Yes Clinical Summary of Care: Electronic Signature(s) Signed: 07/07/2022 1:55:42 PM By: [name] EMT Entered By: [name] on 07/07/2022 13:55:42 -------------------------------------------------------------------------------- Vitals Details Patient Name: Date of Service: [name] 07/07/2022 10:00 A M Medical Record Number: [id number] Patient Account Number: [id number] Date of Birth/Sex: Treating RN: [birth year] (56 y.o. Charlean Merl, [name] Primary Care Ayasha Ellingsen: [name] Other Clinician: [name] Referring Chelcie Estorga: Treating Ysabela Keisler/Extender: [name] in Treatment: 10 Vital Signs Time Taken: 09:49 Temperature (F): 97.5 Height (in): 71 Pulse (bpm): 87 Weight (lbs): 190 Respiratory Rate (breaths/min): 16 Body Mass Index (BMI): 26.5 Blood Pressure (mmHg): 121/75 Reference Range: 80 - 120 mg / dl Electronic Signature(s) Signed: 07/07/2022 1:51:52 PM By: [name] EMT Entered By: [name] on 07/07/2022 13:51:52

## 2022-07-07 NOTE — Progress Notes (Addendum)
Jimmy [name], Jimmy [name] ([id number]) Visit Report for 07/07/2022 HBO Details Patient Name: Date of Service: [name] 07/07/2022 10:00 A M Medical Record Number: [id number] Patient Account Number: [id number] Date of Birth/Sex: Treating RN: 03/03/1966 (56 y.o. 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 HBO Treatment Details Treatment Number: 42 Patient Type: Outpatient Chamber Type: Monoplace Chamber Serial #: [id number] Treatment Protocol: 2.0 ATA with 90 minutes oxygen, with two 5 minute air breaks Treatment Details Compression Rate Down: 2.0 psi / minute De-Compression Rate Up: 2.0 psi / minute A breaks and breathing ir Compress Tx Pressure periods Decompress Decompress Begins Reached (leave unused spaces Begins Ends blank) Chamber Pressure (ATA 1 2 2 2 2 2  --2 1 ) Clock Time (24 hr) 09:52 10:02 10:33 10:38 11:08 11:13 - - 11:43 11:50 Treatment Length: 118 (minutes) Treatment Segments: 4 Vital Signs Capillary Blood Glucose Reference Range: 80 - 120 mg / dl HBO Diabetic Blood Glucose Intervention Range: <131 mg/dl or mg/dl Time Vitals Blood Respiratory Capillary Blood Glucose Pulse Action Type: Pulse: Temperature: Taken: Pressure: Rate: Glucose (mg/dl): Meter #: Oximetry (%) Taken: Pre 09:49 121/75 87 16 97.5 Post 10:52 131/88 97 16 97.3 Treatment Response Treatment Toleration: Well Treatment Completion Status: Treatment Completed without Adverse Event Physician HBO Attestation: I certify that I supervised this HBO treatment in accordance with Medicare guidelines. A trained emergency response  team is readily available per Yes hospital policies and procedures. Continue HBOT as ordered. Yes Electronic Signature(s) Signed: 07/07/2022 3:51:23 PM By: 07/09/2022 DO Previous Signature: 07/07/2022 1:54:39 PM Version By: 07/09/2022 EMT Entered By: [name] on 07/07/2022 15:22:59 -------------------------------------------------------------------------------- HBO Safety Checklist Details Patient Name: Date of Service: 07/09/2022 [name] 07/07/2022 10:00 A M Medical Record Number: 07/09/2022 Patient Account Number: [id number] Date of Birth/Sex: Treating RN: Apr 16, 1966 (56 y.o. 54, [name] Primary Care Darryon Bastin: Charlean Merl Other Clinician: [name] Referring Kaianna Dolezal: Treating Jeremiah Curci/Extender: [name] in Treatment: 10 HBO Safety Checklist Items Safety Checklist Consent Form Signed Patient voided / foley secured and emptied When did you last eato 0700 Last dose of injectable or oral agent NA Ostomy pouch emptied and vented if applicable NA All implantable devices assessed, documented and approved NA Intravenous access site secured and place NA Valuables secured Linens and cotton and cotton/polyester blend (less than 51% polyester) Personal oil-based products / skin lotions / body lotions removed Wigs or hairpieces removed NA Smoking or tobacco materials removed Books / newspapers / magazines / loose paper removed Cologne, aftershave, perfume and deodorant removed Jewelry removed (may wrap wedding band) NA Make-up removed NA Hair care products removed Battery operated devices (external) removed Heating patches and chemical warmers removed Titanium eyewear removed NA Nail polish cured greater than 10 hours NA Casting material cured greater than 10 hours NA Hearing aids removed NA Loose dentures or partials removed NA Prosthetics have been removed NA Patient demonstrates correct use of air break  device (if applicable) Patient concerns have been addressed Patient grounding bracelet on and cord attached to chamber Specifics for Inpatients (complete in addition to above) Medication sheet sent with patient NA Intravenous medications needed or due during therapy sent with patient NA Drainage tubes (e.g. nasogastric tube  or chest tube secured and vented) NA Endotracheal or Tracheotomy tube secured NA Cuff deflated of air and inflated with saline NA Airway suctioned NA Notes The safety checklist was done before the treatment was started Electronic Signature(s) Signed: 07/07/2022 1:53:05 PM By: [name] EMT Entered By: [name] on 07/07/2022 13:53:04

## 2022-07-08 ENCOUNTER — Encounter (HOSPITAL_BASED_OUTPATIENT_CLINIC_OR_DEPARTMENT_OTHER): Payer: BC Managed Care – PPO | Admitting: General Surgery

## 2022-07-08 DIAGNOSIS — M269 Dentofacial anomaly, unspecified: Secondary | ICD-10-CM | POA: Diagnosis not present

## 2022-07-08 DIAGNOSIS — Z21 Asymptomatic human immunodeficiency virus [HIV] infection status: Secondary | ICD-10-CM | POA: Diagnosis not present

## 2022-07-08 DIAGNOSIS — K260 Acute duodenal ulcer with hemorrhage: Secondary | ICD-10-CM | POA: Diagnosis not present

## 2022-07-08 DIAGNOSIS — M8668 Other chronic osteomyelitis, other site: Secondary | ICD-10-CM | POA: Diagnosis not present

## 2022-07-08 DIAGNOSIS — Z87828 Personal history of other (healed) physical injury and trauma: Secondary | ICD-10-CM | POA: Diagnosis not present

## 2022-07-08 NOTE — Progress Notes (Addendum)
WESLY, WHISENANT ([id number]) Visit Report for 07/08/2022 HBO Details Patient Name: Date of Service: [name] 07/08/2022 10:00 A M Medical Record Number: [id number] Patient Account Number: [id number] Date of Birth/Sex: Treating RN: 1966-09-11 (56 y.o. 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 HBO Treatment Details Treatment Number: 43 Patient Type: Outpatient Chamber Type: Monoplace Chamber Serial #: [id number] Treatment Protocol: 2.0 ATA with 90 minutes oxygen, with two 5 minute air breaks Treatment Details Compression Rate Down: 2.0 psi / minute De-Compression Rate Up: A breaks and breathing ir Compress Tx Pressure periods Decompress Decompress Begins Reached (leave unused spaces Begins Ends blank) Chamber Pressure (ATA 1 2 2 2 2 2  --2 1 ) Clock Time (24 hr) 09:57 10:07 10:37 11:42 11:12 11:17 - - 11:47 11:54 Treatment Length: 117 (minutes) Treatment Segments: 4 Vital Signs Capillary Blood Glucose Reference Range: 80 - 120 mg / dl HBO Diabetic Blood Glucose Intervention Range: <131 mg/dl or mg/dl Time Vitals Blood Respiratory Capillary Blood Glucose Pulse Action Type: Pulse: Temperature: Taken: Pressure: Rate: Glucose (mg/dl): Meter #: Oximetry (%) Taken: Pre 09:55 116/94 90 16 98.4 Post 11:56 119/74 84 14 97 Treatment Response Treatment Toleration: Well Treatment Completion Status: Treatment Completed without Adverse Event Physician HBO Attestation: I certify that I supervised this HBO treatment in accordance with Medicare guidelines. A trained emergency response team is readily  available per Yes hospital policies and procedures. Continue HBOT as ordered. Yes Electronic Signature(s) Signed: 07/08/2022 2:22:58 PM By: 07/10/2022 MD FACS Previous Signature: 07/08/2022 12:50:23 PM Version By: 07/10/2022 EMT Previous Signature: 07/08/2022 11:39:35 AM Version By: 07/10/2022 EMT Entered By: [name] on 07/08/2022 14:22:57 -------------------------------------------------------------------------------- HBO Safety Checklist Details Patient Name: Date of Service: 07/10/2022 [name] 07/08/2022 10:00 A M Medical Record Number: 07/10/2022 Patient Account Number: [id number] Date of Birth/Sex: Treating RN: 11/27/1965 (56 y.o. 53 Primary Care Ariany Kesselman: [name] Other Clinician: [name] Referring Kaylem Gidney: Treating Trajon Rosete/Extender: [name] in Treatment: 10 HBO Safety Checklist Items Safety Checklist Consent Form Signed Patient voided / foley secured and emptied When did you last eato 0800 Last dose of injectable or oral agent NA Ostomy pouch emptied and vented if applicable NA All implantable devices assessed, documented and approved NA Intravenous access site secured and place NA Valuables secured Linens and cotton and cotton/polyester blend (less than 51% polyester) Personal oil-based products / skin lotions / body lotions removed Wigs or hairpieces removed NA Smoking or tobacco materials removed Books / newspapers / magazines / loose paper removed Cologne, aftershave, perfume and deodorant removed Jewelry removed (may wrap wedding band) NA Make-up removed NA Hair care products removed Battery operated devices (external) removed Heating patches and chemical warmers removed Titanium eyewear removed NA Nail polish cured greater than 10 hours NA Casting material cured greater than 10 hours NA Hearing aids removed NA Loose dentures or partials removed NA Prosthetics have been  removed NA Patient demonstrates correct use of air break device (if applicable) Patient concerns have been addressed Patient grounding bracelet on and cord attached to chamber Specifics for Inpatients (complete in addition to above) Medication sheet sent with patient NA Intravenous medications needed or due during therapy sent with  patient NA Drainage tubes (e.g. nasogastric tube or chest tube secured and vented) NA Endotracheal or Tracheotomy tube secured NA Cuff deflated of air and inflated with saline NA Airway suctioned NA Notes The safety checklist was done before the treatment was started Electronic Signature(s) Signed: 07/08/2022 11:38:45 AM By: [name] EMT Entered By: [name] on 07/08/2022 11:38:45

## 2022-07-08 NOTE — Progress Notes (Addendum)
Jimmy [name], Jimmy [name] ([id number]) Visit Report for 07/08/2022 Arrival Information Details Patient Name: Date of Service: Jimmy [name]. 07/08/2022 10:00 A M Medical Record Number: [id number] Patient Account Number: [id number] Date of Birth/Sex: Treating RN: October 08, 1966 (56 y.o. Dwaine Deter, [name] Primary Care Ivana Nicastro: [name] Other Clinician: [name] Referring Darlinda Bellows: Treating Elke Holtry/Extender: [name] in Treatment: 10 Visit Information History Since Last Visit All ordered tests and consults were completed: Yes Patient Arrived: Ambulatory Added or deleted any medications: No Arrival Time: 09:48 Any new allergies or adverse reactions: No Accompanied By: Wife Had a fall or experienced change in No Transfer Assistance: None activities of daily living that may affect Patient Identification Verified: Yes risk of falls: Secondary Verification Process Completed: Yes Signs or symptoms of abuse/neglect since last visito No Patient Requires Transmission-Based Precautions: No Hospitalized since last visit: No Patient Has Alerts: No Implantable device outside of the clinic excluding No cellular tissue based products placed in the center since last visit: Pain Present Now: No Electronic Signature(s) Signed: 07/08/2022 11:36:08 AM By: [name] EMT Entered By: [name] on 07/08/2022 11:36:08 -------------------------------------------------------------------------------- Encounter Discharge Information Details Patient Name: Date of Service: Jimmy [name] 07/08/2022 10:00 A M Medical Record Number: [id number] Patient Account Number: [id number] Date of Birth/Sex: Treating RN: [birth year] (56 y.o. [name] Primary Care Maygen Sirico: [name] Other Clinician: [name] Referring Sallie Staron: Treating Ashima Shrake/Extender: [name] in Treatment: 10 Encounter Discharge Information  Items Discharge Condition: Stable Ambulatory Status: Ambulatory Discharge Destination: Home Transportation: Private Auto Accompanied By: Wife Schedule Follow-up Appointment: Yes Clinical Summary of Care: Electronic Signature(s) Signed: 07/08/2022 12:51:31 PM By: [name] EMT Entered By: [name] on 07/08/2022 12:51:31 -------------------------------------------------------------------------------- Vitals Details Patient Name: Date of Service: Jimmy [name] 07/08/2022 10:00 A M Medical Record Number: [id number] Patient Account Number: [id number] Date of Birth/Sex: Treating RN: September 12, 1966 (56 y.o. [name] Primary Care Liann Spaeth: [name] Other Clinician: [name] Referring Curtis Uriarte: Treating Rane Dumm/Extender: [name] in Treatment: 10 Vital Signs Time Taken: 09:55 Temperature (F): 98.4 Height (in): 71 Pulse (bpm): 90 Weight (lbs): 190 Respiratory Rate (breaths/min): 16 Body Mass Index (BMI): 26.5 Blood Pressure (mmHg): 116/94 Reference Range: 80 - 120 mg / dl Electronic Signature(s) Signed: 07/08/2022 11:36:51 AM By: [name] EMT Entered By: [name] on 07/08/2022 11:36:51

## 2022-07-08 NOTE — Progress Notes (Signed)
Jimmy [name], Jimmy [name] ([id number]) Visit Report for 07/08/2022 Problem List Details Patient Name: Date of Service: [name] 07/08/2022 10:00 A M Medical Record Number: [id number] Patient Account Number: [id number] Date of Birth/Sex: Treating RN: December 11, 1965 (56 y.o. 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 07/08/2022 Medical Record Number: [id number] Patient Account Number: [id number] Date of Birth/Sex: Treating RN: [birth year] (56 y.o. 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, unspecified Z21 Asymptomatic human immunodeficiency virus [HIV] infection status K26.0 Acute duodenal ulcer with hemorrhage Facility  Procedures CPT4 Code: [id number] Description: G0277-(Facility Use Only) HBOT full body chamber, , ICD-10 Diagnosis Description M86.68 Other chronic osteomyelitis, other site Z87.828 Personal history of other (healed) physical injury and trauma 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,  unspecified Z21 Asymptomatic human immunodeficiency virus [HIV] infection status Modifier: Quantity: 4 Physician Procedures : CPT4 Code Description Modifier [id number] 81275 - WC PHYS HYPERBARIC OXYGEN THERAPY ICD-10 Diagnosis Description M86.68 Other chronic osteomyelitis, other site Z87.828 Personal history of other (healed) physical injury and trauma M26.9 Dentofacial  anomaly, unspecified Z21 Asymptomatic human immunodeficiency virus [HIV] infection status Quantity: 1 Electronic Signature(s) Signed: 07/08/2022 12:50:50 PM By: [name] EMT Signed: 07/08/2022 2:22:30 PM By: [name] Entered By: [name] on 07/08/2022 12:50:50

## 2022-07-09 ENCOUNTER — Encounter (HOSPITAL_BASED_OUTPATIENT_CLINIC_OR_DEPARTMENT_OTHER): Payer: BC Managed Care – PPO | Admitting: General Surgery

## 2022-07-09 DIAGNOSIS — M269 Dentofacial anomaly, unspecified: Secondary | ICD-10-CM | POA: Diagnosis not present

## 2022-07-09 DIAGNOSIS — Z21 Asymptomatic human immunodeficiency virus [HIV] infection status: Secondary | ICD-10-CM | POA: Diagnosis not present

## 2022-07-09 DIAGNOSIS — Z87828 Personal history of other (healed) physical injury and trauma: Secondary | ICD-10-CM | POA: Diagnosis not present

## 2022-07-09 DIAGNOSIS — M8668 Other chronic osteomyelitis, other site: Secondary | ICD-10-CM | POA: Diagnosis not present

## 2022-07-09 DIAGNOSIS — K260 Acute duodenal ulcer with hemorrhage: Secondary | ICD-10-CM | POA: Diagnosis not present

## 2022-07-09 NOTE — Progress Notes (Addendum)
ARRAN, FESSEL ([id number]) Visit Report for 07/09/2022 Arrival Information Details Patient Name: Date of Service: Jimmy [name]. 07/09/2022 10:00 A M Medical Record Number: [id number] Patient Account Number: [id number] Date of Birth/Sex: Treating RN: 27-Sep-1966 (56 y.o. [name] Primary Care Breyon Blass: [name] Other Clinician: [name] Referring Jaiya Mooradian: Treating Glorianne Proctor/Extender: [name] in Treatment: 10 Visit Information History Since Last Visit All ordered tests and consults were completed: Yes Patient Arrived: Ambulatory Added or deleted any medications: No Arrival Time: 09:39 Any new allergies or adverse reactions: No Accompanied By: spouse Had a fall or experienced change in No Transfer Assistance: None activities of daily living that may affect Patient Identification Verified: Yes risk of falls: Secondary Verification Process Completed: Yes Signs or symptoms of abuse/neglect since last visito No Patient Requires Transmission-Based Precautions: No Hospitalized since last visit: No Patient Has Alerts: No Implantable device outside of the clinic excluding No cellular tissue based products placed in the center since last visit: Pain Present Now: No Electronic Signature(s) Signed: 07/09/2022 1:03:54 PM By: [name] CHT EMT BS , , Entered By: [name] on 07/09/2022 13:03:54 -------------------------------------------------------------------------------- Encounter Discharge Information Details Patient Name: Date of Service: Jimmy Nian [name] 07/09/2022 10:00 A M Medical Record Number: [id number] Patient Account Number: [id number] Date of Birth/Sex: Treating RN: 06-25-1966 (56 y.o. [name] Primary Care Candance Bohlman: [name] Other Clinician: [name] Referring Ugochi Henzler: Treating Tynleigh Birt/Extender: [name] in Treatment: 10 Encounter Discharge  Information Items Discharge Condition: Stable Ambulatory Status: Ambulatory Discharge Destination: Home Transportation: Private Auto Accompanied By: self Schedule Follow-up Appointment: No Clinical Summary of Care: Electronic Signature(s) Signed: 07/09/2022 1:10:14 PM By: [name] CHT EMT BS , , Entered By: [name] on 07/09/2022 13:10:14 -------------------------------------------------------------------------------- Vitals Details Patient Name: Date of Service: Jimmy Nian [name] 07/09/2022 10:00 A M Medical Record Number: [id number] Patient Account Number: [id number] Date of Birth/Sex: Treating RN: Jan 02, 1966 (56 y.o. [name] Primary Care Dandra Shambaugh: [name] Other Clinician: [name] Referring Willem Klingensmith: Treating Dierks Wach/Extender: [name] in Treatment: 10 Vital Signs Time Taken: 09:39 Temperature (F): 98.2 Height (in): 71 Pulse (bpm): 79 Weight (lbs): 190 Respiratory Rate (breaths/min): 24 Body Mass Index (BMI): 26.5 Blood Pressure (mmHg): 131/71 Reference Range: 80 - 120 mg / dl Electronic Signature(s) Signed: 07/09/2022 1:04:34 PM By: [name] CHT EMT BS , , Entered By: [name] on 07/09/2022 13:04:33

## 2022-07-09 NOTE — Progress Notes (Signed)
Jimmy [name], Jimmy [name] ([id number]) Visit Report for 07/09/2022 SuperBill Details Patient Name: Date of Service: [name] 07/09/2022 Medical Record Number: [id number] Patient Account Number: [id number] Date of Birth/Sex: Treating RN: March 08, 1966 (56 y.o. 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, unspecified Z21 Asymptomatic human immunodeficiency virus [HIV] infection status K26.0 Acute duodenal ulcer with hemorrhage Facility Procedures CPT4 Code Description Modifier Quantity [id number] G0277-(Facility Use Only) HBOT full body chamber, , 4 ICD-10 Diagnosis Description M86.68 Other chronic osteomyelitis, other site Z87.828 Personal history of other (healed) physical injury and trauma 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

## 2022-07-09 NOTE — Telephone Encounter (Signed)
Records received and given to provider.   Sandie Ano, RN

## 2022-07-09 NOTE — Progress Notes (Addendum)
Jimmy [name], Jimmy [name] ([id number]) Visit Report for 07/09/2022 HBO Details Patient Name: Date of Service: Jimmy [name]. 07/09/2022 10:00 A M Medical Record Number: [id number] Patient Account Number: [id number] Date of Birth/Sex: Treating RN: 08-26-1966 (56 y.o. 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 HBO Treatment Details Treatment Number: 44 Patient Type: Outpatient Chamber Type: Monoplace Chamber Serial #: [id number] Treatment Protocol: 2.0 ATA with 90 minutes oxygen, with two 5 minute air breaks Treatment Details Compression Rate Down: 2.0 psi / minute De-Compression Rate Up: 2.0 psi / minute A breaks and breathing ir Compress Tx Pressure periods Decompress Decompress Begins Reached (leave unused spaces Begins Ends blank) Chamber Pressure (ATA 1 2 2 2 2 2  --2 1 ) Clock Time (24 hr) 09:47 09:55 10:25 10:30 11:00 11:05 - - 11:35 11:43 Treatment Length: 116 (minutes) Treatment Segments: 4 Vital Signs Capillary Blood Glucose Reference Range: 80 - 120 mg / dl HBO Diabetic Blood Glucose Intervention Range: <131 mg/dl or mg/dl Type: Time Vitals Blood Respiratory Capillary Blood Glucose Pulse Action Pulse: Temperature: Taken: Pressure: Rate: Glucose (mg/dl): Meter #: Oximetry (%) Taken: Pre 09:39 131/71 79 24 98.2 none per protocol Post 11:45 126/83 79 18 97.2 none per protocol Treatment Response Treatment Toleration: Well Treatment Completion Status: Treatment Completed without Adverse Event Physician HBO Attestation: I certify that I supervised this HBO treatment in accordance with  Medicare guidelines. A trained emergency response team is readily available per Yes hospital policies and procedures. Continue HBOT as ordered. Yes Electronic Signature(s) Signed: 07/09/2022 1:26:35 PM By: 07/11/2022 MD FACS Previous Signature: 07/09/2022 1:08:50 PM Version By: 07/11/2022 CHT EMT BS , , Entered By: Jimmy [name] on 07/09/2022 13:26:35 -------------------------------------------------------------------------------- HBO Safety Checklist Details Patient Name: Date of Service: 07/11/2022 Jimmy W. 07/09/2022 10:00 A M Medical Record Number: 07/11/2022 Patient Account Number: [id number] Date of Birth/Sex: Treating RN: 02/16/1966 (56 y.o. 53 Primary Care Jimmy [name]: Jimmy [name] Other Clinician: Alinda [name] Referring Jimmy [name]: Treating Jimmy [name]/Extender: Jimmy [name] in Treatment: 10 HBO Safety Checklist Items Safety Checklist Consent Form Signed Patient voided / foley secured and emptied When did you last eato 0800 Last dose of injectable or oral agent n/a Ostomy pouch emptied and vented if applicable NA All implantable devices assessed, documented and approved NA Intravenous access site secured and place NA Valuables secured Linens and cotton and cotton/polyester blend (less than 51% polyester) Personal oil-based products / skin lotions / body lotions removed Wigs or hairpieces removed NA Smoking or tobacco materials removed NA Books / newspapers / magazines / loose paper removed Cologne, aftershave, perfume and deodorant removed Jewelry removed (may wrap wedding band) Make-up removed Hair care products removed NA Battery operated devices (external) removed Heating patches and chemical warmers removed Titanium eyewear removed Nail polish cured greater than 10 hours NA Casting material cured greater than 10 hours NA Hearing aids removed NA Loose dentures or partials removed NA Prosthetics  have been removed NA Patient demonstrates correct use of air break device (if applicable) Patient concerns have been addressed Patient grounding bracelet on and cord attached to chamber Specifics for Inpatients (complete in addition to above) Medication sheet sent with patient NA Intravenous medications needed or due during  therapy sent with patient NA Drainage tubes (e.g. nasogastric tube or chest tube secured and vented) NA Endotracheal or Tracheotomy tube secured NA Cuff deflated of air and inflated with saline NA Airway suctioned NA Notes Paper version used prior to treatment. Electronic Signature(s) Signed: 07/09/2022 1:07:10 PM By: Jimmy [name] CHT EMT BS , , Entered By: Jimmy [name] on 07/09/2022 13:07:10

## 2022-07-10 ENCOUNTER — Encounter (HOSPITAL_BASED_OUTPATIENT_CLINIC_OR_DEPARTMENT_OTHER): Payer: BC Managed Care – PPO | Admitting: Internal Medicine

## 2022-07-10 DIAGNOSIS — M269 Dentofacial anomaly, unspecified: Secondary | ICD-10-CM

## 2022-07-10 DIAGNOSIS — Z21 Asymptomatic human immunodeficiency virus [HIV] infection status: Secondary | ICD-10-CM | POA: Diagnosis not present

## 2022-07-10 DIAGNOSIS — K260 Acute duodenal ulcer with hemorrhage: Secondary | ICD-10-CM | POA: Diagnosis not present

## 2022-07-10 DIAGNOSIS — M8668 Other chronic osteomyelitis, other site: Secondary | ICD-10-CM | POA: Diagnosis not present

## 2022-07-10 DIAGNOSIS — Z87828 Personal history of other (healed) physical injury and trauma: Secondary | ICD-10-CM | POA: Diagnosis not present

## 2022-07-10 NOTE — Progress Notes (Addendum)
Jimmy [name], Jimmy [name] ([id number]) Visit Report for 07/10/2022 Arrival Information Details Patient Name: Date of Service: Jimmy [name]. 07/10/2022 10:00 A M Medical Record Number: [id number] Patient Account Number: [id number] Date of Birth/Sex: Treating RN: 1966-11-10 (56 y.o. Jimmy [name], Jimmy [name] Primary Care Jimmy [name]: Jimmy [name] Other Clinician: Karl [name] Referring Jimmy [name]: Treating Jimmy [name]/Extender: Jimmy [name] in Treatment: 11 Visit Information History Since Last Visit All ordered tests and consults were completed: Yes Patient Arrived: Ambulatory Added or deleted any medications: No Arrival Time: 09:40 Any new allergies or adverse reactions: No Accompanied By: Wife Had a fall or experienced change in No Transfer Assistance: None activities of daily living that may affect Patient Identification Verified: Yes risk of falls: Secondary Verification Process Completed: Yes Signs or symptoms of abuse/neglect since last visito No Patient Requires Transmission-Based Precautions: No Hospitalized since last visit: No Patient Has Alerts: No Implantable device outside of the clinic excluding No cellular tissue based products placed in the center since last visit: Pain Present Now: No Electronic Signature(s) Signed: 07/10/2022 12:36:09 PM By: Jimmy [name] EMT Entered By: Jimmy [name] on 07/10/2022 12:36:09 -------------------------------------------------------------------------------- Encounter Discharge Information Details Patient Name: Date of Service: Jimmy [name] 07/10/2022 10:00 A M Medical Record Number: [id number] Patient Account Number: [id number] Date of Birth/Sex: Treating RN: 1966/04/20 (56 y.o. [name] Primary Care Saydi Kobel: Jimmy [name] Other Clinician: Karl [name] Referring Roselee Tayloe: Treating Ramond Darnell/Extender: Jimmy [name] in Treatment: 11 Encounter Discharge Information  Items Discharge Condition: Stable Ambulatory Status: Ambulatory Discharge Destination: Home Transportation: Private Auto Accompanied By: Wife Schedule Follow-up Appointment: Yes Clinical Summary of Care: Electronic Signature(s) Signed: 07/10/2022 12:40:44 PM By: Jimmy [name] EMT Entered By: Jimmy [name] on 07/10/2022 12:40:44 -------------------------------------------------------------------------------- Vitals Details Patient Name: Date of Service: Jimmy [name] 07/10/2022 10:00 A M Medical Record Number: [id number] Patient Account Number: [id number] Date of Birth/Sex: Treating RN: 05-04-1966 (56 y.o. [name] Primary Care Burnett Lieber: Jimmy [name] Other Clinician: Karl [name] Referring Tonnie Stillman: Treating Layla Kesling/Extender: Jimmy [name] in Treatment: 11 Vital Signs Time Taken: 10:02 Temperature (F): 97.2 Height (in): 71 Pulse (bpm): 82 Weight (lbs): 190 Respiratory Rate (breaths/min): 18 Body Mass Index (BMI): 26.5 Blood Pressure (mmHg): 118/75 Reference Range: 80 - 120 mg / dl Electronic Signature(s) Signed: 07/10/2022 12:36:44 PM By: Jimmy [name] EMT Entered By: Jimmy [name] on 07/10/2022 12:36:43

## 2022-07-10 NOTE — Progress Notes (Signed)
MORDCHE, HEDGLIN ([id number]) Visit Report for 07/10/2022 HBO Details Patient Name: Date of Service: [name] 07/10/2022 10:00 A M Medical Record Number: [id number] Patient Account Number: [id number] Date of Birth/Sex: Treating RN: Nov 30, 1965 (56 y.o. 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 HBO Treatment Details Treatment Number: 45 Patient Type: Outpatient Chamber Type: Monoplace Chamber Serial #: [id number] Treatment Protocol: 2.0 ATA with 90 minutes oxygen, with two 5 minute air breaks Treatment Details Compression Rate Down: 2.0 psi / minute De-Compression Rate Up: 2.0 psi / minute A breaks and breathing ir Compress Tx Pressure periods Decompress Decompress Begins Reached (leave unused spaces Begins Ends blank) Chamber Pressure (ATA 1 2 2 2 2 2  --2 1 ) Clock Time (24 hr) 10:10 10:19 10:49 10:54 11:24 11:29 - - 11:59 12:07 Treatment Length: 117 (minutes) Treatment Segments: 4 Vital Signs Capillary Blood Glucose Reference Range: 80 - 120 mg / dl HBO Diabetic Blood Glucose Intervention Range: <131 mg/dl or mg/dl Time Vitals Blood Respiratory Capillary Blood Glucose Pulse Action Type: Pulse: Temperature: Taken: Pressure: Rate: Glucose (mg/dl): Meter #: Oximetry (%) Taken: Pre 10:02 118/75 82 18 97.2 Post 12:08 119/78 76 16 97.2 Treatment Response Treatment Toleration: Well Treatment Completion Status: Treatment Completed without Adverse Event Physician HBO Attestation: I certify that I supervised this HBO treatment in accordance with Medicare guidelines. A trained emergency response  team is readily available per Yes hospital policies and procedures. Continue HBOT as ordered. Yes Electronic Signature(s) Signed: 07/13/2022 10:13:31 AM By: 07/15/2022 DO Previous Signature: 07/10/2022 12:39:46 PM Version By: 07/12/2022 EMT Entered By: [name] on 07/13/2022 10:12:20 -------------------------------------------------------------------------------- HBO Safety Checklist Details Patient Name: Date of Service: 07/15/2022 [name] 07/10/2022 10:00 A M Medical Record Number: 07/12/2022 Patient Account Number: [id number] Date of Birth/Sex: Treating RN: 04/11/1966 (56 y.o. 54 Primary Care Ariya Bohannon: [name] Other Clinician: [name] Referring Cheskel Silverio: Treating Asa Fath/Extender: [name] in Treatment: 11 HBO Safety Checklist Items Safety Checklist Consent Form Signed Patient voided / foley secured and emptied When did you last eato 0600 Last dose of injectable or oral agent NA Ostomy pouch emptied and vented if applicable NA All implantable devices assessed, documented and approved NA Intravenous access site secured and place NA Valuables secured Linens and cotton and cotton/polyester blend (less than 51% polyester) Personal oil-based products / skin lotions / body lotions removed Wigs or hairpieces removed NA Smoking or tobacco materials removed Books / newspapers / magazines / loose paper removed Cologne, aftershave, perfume and deodorant removed Jewelry removed (may wrap wedding band) NA Make-up removed NA Hair care products removed Battery operated devices (external) removed Heating patches and chemical warmers removed Titanium eyewear removed NA Nail polish cured greater than 10 hours NA Casting material cured greater than 10 hours NA Hearing aids removed NA Loose dentures or partials removed NA Prosthetics have been removed NA Patient demonstrates correct use of air break  device (if applicable) Patient concerns have been addressed Patient grounding bracelet on and cord attached to chamber Specifics for Inpatients (complete in addition to above) Medication sheet sent with patient NA Intravenous medications needed or due during therapy sent with patient NA Drainage tubes (e.g. nasogastric tube  or chest tube secured and vented) NA Endotracheal or Tracheotomy tube secured NA Cuff deflated of air and inflated with saline NA Airway suctioned NA Notes The safety checklist was done before the treatment was started. Electronic Signature(s) Signed: 07/10/2022 12:38:06 PM By: [name] EMT Entered By: [name] on 07/10/2022 12:38:06

## 2022-07-13 ENCOUNTER — Encounter (HOSPITAL_BASED_OUTPATIENT_CLINIC_OR_DEPARTMENT_OTHER): Payer: BC Managed Care – PPO | Admitting: Internal Medicine

## 2022-07-13 NOTE — Progress Notes (Signed)
Jimmy [name], Jimmy [name] ([id number]) Visit Report for 07/10/2022 Problem List Details Patient Name: Date of Service: [name] 07/10/2022 10:00 A M Medical Record Number: [id number] Patient Account Number: [id number] Date of Birth/Sex: Treating RN: 07-14-1966 (56 y.o. 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 07/10/2022 Medical Record Number: [id number] Patient Account Number: [id number] Date of Birth/Sex: Treating RN: [birth year] (56 y.o. 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, unspecified Z21 Asymptomatic human immunodeficiency virus [HIV] infection status K26.0 Acute duodenal ulcer with hemorrhage Facility  Procedures CPT4 Code: [id number] Description: G0277-(Facility Use Only) HBOT full body chamber, , ICD-10 Diagnosis Description M86.68 Other chronic osteomyelitis, other site Z87.828 Personal history of other (healed) physical injury and trauma 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,  unspecified Z21 Asymptomatic human immunodeficiency virus [HIV] infection status Modifier: Quantity: 4 Physician Procedures : CPT4 Code Description Modifier [id number] - WC PHYS HYPERBARIC OXYGEN THERAPY ICD-10 Diagnosis Description M86.68 Other chronic osteomyelitis, other site Z87.828 Personal history of other (healed) physical injury and trauma M26.9 Dentofacial  anomaly, unspecified Z21 Asymptomatic human immunodeficiency virus [HIV] infection status Quantity: 1 Electronic Signature(s) Signed: 07/10/2022 12:40:10 PM By: [name] EMT Signed: 07/13/2022 10:13:31 AM By: Geralyn Corwin DO Entered By: [name] on 07/10/2022 12:40:10

## 2022-07-14 ENCOUNTER — Encounter (HOSPITAL_BASED_OUTPATIENT_CLINIC_OR_DEPARTMENT_OTHER): Payer: BC Managed Care – PPO | Admitting: Internal Medicine

## 2022-07-14 DIAGNOSIS — Z21 Asymptomatic human immunodeficiency virus [HIV] infection status: Secondary | ICD-10-CM | POA: Diagnosis not present

## 2022-07-14 DIAGNOSIS — M8668 Other chronic osteomyelitis, other site: Secondary | ICD-10-CM

## 2022-07-14 DIAGNOSIS — M269 Dentofacial anomaly, unspecified: Secondary | ICD-10-CM | POA: Diagnosis not present

## 2022-07-14 DIAGNOSIS — Z87828 Personal history of other (healed) physical injury and trauma: Secondary | ICD-10-CM | POA: Diagnosis not present

## 2022-07-14 DIAGNOSIS — K260 Acute duodenal ulcer with hemorrhage: Secondary | ICD-10-CM | POA: Diagnosis not present

## 2022-07-14 NOTE — Progress Notes (Addendum)
AQIB, LOUGH ([id number]) Visit Report for 07/14/2022 HBO Details Patient Name: Date of Service: Jimmy [name]. 07/14/2022 10:00 A M Medical Record Number: [id number] Patient Account Number: [id number] Date of Birth/Sex: Treating RN: 23-Mar-1966 (56 y.o. 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 HBO Treatment Details Treatment Number: 46 Patient Type: Outpatient Chamber Type: Monoplace Chamber Serial #: [id number] Treatment Protocol: 2.0 ATA with 90 minutes oxygen, with two 5 minute air breaks Treatment Details Compression Rate Down: 2.0 psi / minute De-Compression Rate Up: 2.0 psi / minute A breaks and breathing ir Compress Tx Pressure periods Decompress Decompress Begins Reached (leave unused spaces Begins Ends blank) Chamber Pressure (ATA 1 2 2 2 2 2  --2 1 ) Clock Time (24 hr) 10:19 10:27 10:57 11:02 11:32 11:37 - - 12:07 12:14 Treatment Length: 115 (minutes) Treatment Segments: 4 Vital Signs Capillary Blood Glucose Reference Range: 80 - 120 mg / dl HBO Diabetic Blood Glucose Intervention Range: <131 mg/dl or mg/dl Time Vitals Blood Respiratory Capillary Blood Glucose Pulse Action Type: Pulse: Temperature: Taken: Pressure: Rate: Glucose (mg/dl): Meter #: Oximetry (%) Taken: Pre 10:02 129/78 79 20 98.2 Post 12:16 128/82 79 18 98.1 Treatment Response Treatment Toleration: Well Treatment Completion Status: Treatment Completed without Adverse Event Physician HBO Attestation: I certify that I supervised this HBO treatment in accordance with Medicare guidelines. A trained emergency response  team is readily available per Yes hospital policies and procedures. Continue HBOT as ordered. Yes Electronic Signature(s) Signed: 07/14/2022 4:26:57 PM By: 07/16/2022 DO Previous Signature: 07/14/2022 1:17:01 PM Version By: 07/16/2022 CHT EMT BS , , Entered By: Jimmy [name] on 07/14/2022 16:23:46 -------------------------------------------------------------------------------- HBO Safety Checklist Details Patient Name: Date of Service: 07/16/2022 Jimmy W. 07/14/2022 10:00 A M Medical Record Number: 07/16/2022 Patient Account Number: [id number] Date of Birth/Sex: Treating RN: 1966/06/24 (56 y.o. 54, Jimmy [name] Primary Care Jimmy [name]: [name] Other Clinician: Alinda [name] Referring Jimmy [name]: Treating Jimmy [name]/Extender: Jimmy [name] in Treatment: 11 HBO Safety Checklist Items Safety Checklist Consent Form Signed Patient voided / foley secured and emptied When did you last eato 0800 Last dose of injectable or oral agent n/a Ostomy pouch emptied and vented if applicable NA All implantable devices assessed, documented and approved NA Intravenous access site secured and place NA Valuables secured Linens and cotton and cotton/polyester blend (less than 51% polyester) Personal oil-based products / skin lotions / body lotions removed Wigs or hairpieces removed NA Smoking or tobacco materials removed NA Books / newspapers / magazines / loose paper removed Cologne, aftershave, perfume and deodorant removed Jewelry removed (may wrap wedding band) Make-up removed NA Hair care products removed Battery operated devices (external) removed Heating patches and chemical warmers removed Titanium eyewear removed NA Nail polish cured greater than 10 hours NA Casting material cured greater than 10 hours NA Hearing aids removed NA Loose dentures or partials removed NA Prosthetics have been removed NA Patient demonstrates correct  use of air break device (if applicable) Patient concerns have been addressed Patient grounding bracelet on and cord attached to chamber Specifics for Inpatients (complete in addition to above) Medication sheet sent with patient NA Intravenous medications needed or due during therapy sent with patient NA Drainage  tubes (e.g. nasogastric tube or chest tube secured and vented) NA Endotracheal or Tracheotomy tube secured NA Cuff deflated of air and inflated with saline NA Airway suctioned NA Notes Paper version used prior to treatment. Electronic Signature(s) Signed: 07/14/2022 1:12:24 PM By: Jimmy [name] CHT EMT BS , , Entered By: Jimmy [name] on 07/14/2022 13:12:24

## 2022-07-14 NOTE — Progress Notes (Signed)
KANE, KUSEK ([id number]) Visit Report for 07/14/2022 Arrival Information Details Patient Name: Date of Service: Jimmy [name]. 07/14/2022 10:00 A M Medical Record Number: [id number] Patient Account Number: [id number] Date of Birth/Sex: Treating RN: 08-20-1966 (56 y.o. Jimmy [name], [name] Primary Care Jimmy [name]: [name] Other Clinician: Haywood [name] Referring [name]: Treating Jimmy [name]/Extender: Jimmy [name] in Treatment: 11 Visit Information History Since Last Visit All ordered tests and consults were completed: Yes Patient Arrived: Ambulatory Added or deleted any medications: No Arrival Time: 09:31 Any new allergies or adverse reactions: No Accompanied By: self Had a fall or experienced change in No Transfer Assistance: None activities of daily living that may affect Patient Identification Verified: Yes risk of falls: Secondary Verification Process Completed: Yes Signs or symptoms of abuse/neglect since last visito No Patient Requires Transmission-Based Precautions: No Hospitalized since last visit: No Patient Has Alerts: No Implantable device outside of the clinic excluding No cellular tissue based products placed in the center since last visit: Pain Present Now: No Electronic Signature(s) Signed: 07/14/2022 1:09:06 PM By: Jimmy [name] CHT EMT BS , , Entered By: Jimmy [name] on 07/14/2022 13:09:06 -------------------------------------------------------------------------------- Encounter Discharge Information Details Patient Name: Date of Service: Jimmy Nian [name] 07/14/2022 10:00 A M Medical Record Number: [id number] Patient Account Number: [id number] Date of Birth/Sex: Treating RN: 06/23/1966 (56 y.o. Jimmy [name] Primary Care Jimmy [name]: [name] Other Clinician: Haywood [name] Referring Jimmy [name]: Treating Jimmy [name]/Extender: Jimmy [name] in Treatment: 11 Encounter Discharge  Information Items Discharge Condition: Stable Ambulatory Status: Ambulatory Discharge Destination: Home Transportation: Private Auto Accompanied By: spouse Schedule Follow-up Appointment: No Clinical Summary of Care: Electronic Signature(s) Signed: 07/14/2022 1:18:21 PM By: Jimmy [name] CHT EMT BS , , Entered By: Jimmy [name] on 07/14/2022 13:18:21 -------------------------------------------------------------------------------- Vitals Details Patient Name: Date of Service: Jimmy Nian [name] 07/14/2022 10:00 A M Medical Record Number: [id number] Patient Account Number: [id number] Date of Birth/Sex: Treating RN: May 16, 1966 (56 y.o. Jimmy [name], [name] Primary Care Jimmy [name]: [name] Other Clinician: Haywood [name] Referring Jimmy [name]: Treating Jimmy [name]/Extender: Jimmy [name] in Treatment: 11 Vital Signs Time Taken: 10:02 Temperature (F): 98.2 Height (in): 71 Pulse (bpm): 79 Weight (lbs): 190 Respiratory Rate (breaths/min): 20 Body Mass Index (BMI): 26.5 Blood Pressure (mmHg): 129/78 Reference Range: 80 - 120 mg / dl Electronic Signature(s) Signed: 07/14/2022 1:11:13 PM By: Jimmy [name] CHT EMT BS , , Entered By: Jimmy [name] on 07/14/2022 13:11:13

## 2022-07-14 NOTE — Progress Notes (Signed)
TRAVELL, DESAULNIERS ([id number]) Visit Report for 07/14/2022 SuperBill Details Patient Name: Date of Service: [name] 07/14/2022 Medical Record Number: [id number] Patient Account Number: [id number] Date of Birth/Sex: Treating RN: 12/16/1965 (56 y.o. 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, unspecified Z21 Asymptomatic human immunodeficiency virus [HIV] infection status K26.0 Acute duodenal ulcer with hemorrhage Facility Procedures CPT4 Code Description Modifier Quantity [id number] G0277-(Facility Use Only) HBOT full body chamber, , 4 ICD-10 Diagnosis Description M86.68 Other chronic osteomyelitis, other site Z87.828 Personal history of other (healed) physical injury and trauma 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

## 2022-07-15 ENCOUNTER — Encounter (HOSPITAL_BASED_OUTPATIENT_CLINIC_OR_DEPARTMENT_OTHER): Payer: BC Managed Care – PPO | Admitting: General Surgery

## 2022-07-15 DIAGNOSIS — M269 Dentofacial anomaly, unspecified: Secondary | ICD-10-CM | POA: Diagnosis not present

## 2022-07-15 DIAGNOSIS — M8668 Other chronic osteomyelitis, other site: Secondary | ICD-10-CM | POA: Diagnosis not present

## 2022-07-15 DIAGNOSIS — K260 Acute duodenal ulcer with hemorrhage: Secondary | ICD-10-CM | POA: Diagnosis not present

## 2022-07-15 DIAGNOSIS — Z87828 Personal history of other (healed) physical injury and trauma: Secondary | ICD-10-CM | POA: Diagnosis not present

## 2022-07-15 DIAGNOSIS — Z21 Asymptomatic human immunodeficiency virus [HIV] infection status: Secondary | ICD-10-CM | POA: Diagnosis not present

## 2022-07-15 NOTE — Progress Notes (Addendum)
Jimmy [name], Jimmy [name] ([id number]) Visit Report for 07/15/2022 Arrival Information Details Patient Name: Date of Service: Jimmy [name]. 07/15/2022 10:00 A M Medical Record Number: [id number] Patient Account Number: [id number] Date of Birth/Sex: Treating RN: [birth year] (56 y.o. [name], [name] Primary Care Oluwadamilare Tobler: [name] Other Clinician: [name] Referring Murline Weigel: Treating Eulamae Greenstein/Extender: [name] in Treatment: 11 Visit Information History Since Last Visit All ordered tests and consults were completed: Yes Patient Arrived: Ambulatory Added or deleted any medications: No Arrival Time: 09:38 Any new allergies or adverse reactions: No Accompanied By: spouse Had a fall or experienced change in No Transfer Assistance: None activities of daily living that may affect Patient Identification Verified: Yes risk of falls: Secondary Verification Process Completed: Yes Signs or symptoms of abuse/neglect since last visito No Patient Requires Transmission-Based Precautions: No Hospitalized since last visit: No Patient Has Alerts: No Implantable device outside of the clinic excluding No cellular tissue based products placed in the center since last visit: Pain Present Now: No Electronic Signature(s) Signed: 07/15/2022 11:11:06 AM By: [name] CHT EMT BS , , Entered By: [name] on 07/15/2022 11:11:06 -------------------------------------------------------------------------------- Encounter Discharge Information Details Patient Name: Date of Service: Jimmy Nian [name] 07/15/2022 10:00 A M Medical Record Number: [id number] Patient Account Number: [id number] Date of Birth/Sex: Treating RN: Oct 16, 1966 (56 y.o. [name] Primary Care Korbyn Chopin: [name] Other Clinician: [name] Referring Will Schier: Treating Tae Robak/Extender: [name] in Treatment: 11 Encounter Discharge  Information Items Discharge Condition: Stable Ambulatory Status: Ambulatory Discharge Destination: Home Transportation: Private Auto Accompanied By: spouse Schedule Follow-up Appointment: No Clinical Summary of Care: Electronic Signature(s) Signed: 07/15/2022 3:24:20 PM By: [name] CHT EMT BS , , Entered By: [name] on 07/15/2022 15:24:19 -------------------------------------------------------------------------------- Vitals Details Patient Name: Date of Service: Jimmy Nian [name] 07/15/2022 10:00 A M Medical Record Number: [id number] Patient Account Number: [id number] Date of Birth/Sex: Treating RN: 1966/10/27 (56 y.o. [name], [name] Primary Care Sadia Belfiore: [name] Other Clinician: [name] Referring Princetta Uplinger: Treating Leonda Cristo/Extender: [name] in Treatment: 11 Vital Signs Time Taken: 09:50 Temperature (F): 98.2 Height (in): 71 Pulse (bpm): 78 Weight (lbs): 190 Respiratory Rate (breaths/min): 24 Body Mass Index (BMI): 26.5 Blood Pressure (mmHg): 120/78 Reference Range: 80 - 120 mg / dl Electronic Signature(s) Signed: 07/15/2022 11:11:38 AM By: [name] CHT EMT BS , , Entered By: [name] on 07/15/2022 11:11:38

## 2022-07-15 NOTE — Progress Notes (Signed)
ISACC, TURNEY ([id number]) Visit Report for 07/15/2022 SuperBill Details Patient Name: Date of Service: [name] 07/15/2022 Medical Record Number: [id number] Patient Account Number: [id number] Date of Birth/Sex: Treating RN: Aug 13, 1966 (56 y.o. 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, unspecified Z21 Asymptomatic human immunodeficiency virus [HIV] infection status K26.0 Acute duodenal ulcer with hemorrhage Facility Procedures CPT4 Code Description Modifier Quantity [id number] G0277-(Facility Use Only) HBOT full body chamber, , 4 ICD-10 Diagnosis Description M86.68 Other chronic osteomyelitis, other site Z87.828 Personal history of other (healed) physical injury and trauma 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

## 2022-07-15 NOTE — Progress Notes (Addendum)
Jimmy [name], Jimmy [name] ([id number]) Visit Report for 07/15/2022 HBO Details Patient Name: Date of Service: [name] 07/15/2022 10:00 A M Medical Record Number: [id number] Patient Account Number: [id number] Date of Birth/Sex: Treating RN: 1966-08-11 (56 y.o. 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 HBO Treatment Details Treatment Number: 47 Patient Type: Outpatient Chamber Type: Monoplace Chamber Serial #: [id number] Treatment Protocol: 2.0 ATA with 90 minutes oxygen, with two 5 minute air breaks Treatment Details Compression Rate Down: 2.0 psi / minute De-Compression Rate Up: 2.0 psi / minute A breaks and breathing ir Compress Tx Pressure periods Decompress Decompress Begins Reached (leave unused spaces Begins Ends blank) Chamber Pressure (ATA 1 2 2 2 2 2  --2 1 ) Clock Time (24 hr) 10:12 10:20 10:50 10:55 11:25 11:30 - - 12:00 12:08 Treatment Length: 116 (minutes) Treatment Segments: 4 Vital Signs Capillary Blood Glucose Reference Range: 80 - 120 mg / dl HBO Diabetic Blood Glucose Intervention Range: <131 mg/dl or mg/dl Type: Time Vitals Blood Respiratory Capillary Blood Glucose Pulse Action Pulse: Temperature: Taken: Pressure: Rate: Glucose (mg/dl): Meter #: Oximetry (%) Taken: Pre 09:50 120/78 78 24 98.2 none per protocol Post 12:08 135/90 74 20 97.9 none per protocol Treatment Response Treatment Toleration: Well Treatment Completion Status: Treatment Completed without Adverse Event Physician HBO Attestation: I certify that I supervised this HBO treatment in accordance with  Medicare guidelines. A trained emergency response team is readily available per Yes hospital policies and procedures. Continue HBOT as ordered. Yes Electronic Signature(s) Signed: 07/15/2022 3:57:09 PM By: 07/17/2022 MD FACS Previous Signature: 07/15/2022 2:30:51 PM Version By: 07/17/2022 CHT EMT BS , , Entered By: [name] on 07/15/2022 15:57:09 -------------------------------------------------------------------------------- HBO Safety Checklist Details Patient Name: Date of Service: 07/17/2022 [name] 07/15/2022 10:00 A M Medical Record Number: 07/17/2022 Patient Account Number: [id number] Date of Birth/Sex: Treating RN: 11/18/1965 (56 y.o. 54, Jimmy [name] Primary Care Jimmy [name]: [name] Other Clinician: [name] Referring [name]: Treating Merri Dimaano/Extender: [name] in Treatment: 11 HBO Safety Checklist Items Safety Checklist Consent Form Signed Patient voided / foley secured and emptied When did you last eato 0800 Last dose of injectable or oral agent n/a Ostomy pouch emptied and vented if applicable NA All implantable devices assessed, documented and approved NA Intravenous access site secured and place NA Valuables secured Linens and cotton and cotton/polyester blend (less than 51% polyester) Personal oil-based products / skin lotions / body lotions removed NA Wigs or hairpieces removed NA Smoking or tobacco materials removed Books / newspapers / magazines / loose paper removed Cologne, aftershave, perfume and deodorant removed Jewelry removed (may wrap wedding band) Make-up removed NA Hair care products removed Battery operated devices (external) removed Heating patches and chemical warmers removed Titanium eyewear removed NA Nail polish cured greater than 10 hours NA Casting material cured greater than 10 hours NA Hearing aids removed NA Loose dentures or partials  removed NA Prosthetics have been removed NA Patient demonstrates correct use of air break device (if applicable) Patient concerns have been addressed Patient grounding bracelet on and cord attached to chamber Specifics for Inpatients (complete in addition to above) Medication sheet sent with patient NA Intravenous medications needed or due  during therapy sent with patient NA Drainage tubes (e.g. nasogastric tube or chest tube secured and vented) NA Endotracheal or Tracheotomy tube secured NA Cuff deflated of air and inflated with saline NA Airway suctioned NA Notes Paper version used prior to treatment. Electronic Signature(s) Signed: 07/15/2022 11:12:44 AM By: [name] CHT EMT BS , , Entered By: [name] on 07/15/2022 11:12:44

## 2022-07-16 ENCOUNTER — Encounter (HOSPITAL_BASED_OUTPATIENT_CLINIC_OR_DEPARTMENT_OTHER): Payer: BC Managed Care – PPO | Admitting: General Surgery

## 2022-07-16 DIAGNOSIS — Z87828 Personal history of other (healed) physical injury and trauma: Secondary | ICD-10-CM | POA: Diagnosis not present

## 2022-07-16 DIAGNOSIS — Z21 Asymptomatic human immunodeficiency virus [HIV] infection status: Secondary | ICD-10-CM | POA: Diagnosis not present

## 2022-07-16 DIAGNOSIS — M8668 Other chronic osteomyelitis, other site: Secondary | ICD-10-CM | POA: Diagnosis not present

## 2022-07-16 DIAGNOSIS — M269 Dentofacial anomaly, unspecified: Secondary | ICD-10-CM | POA: Diagnosis not present

## 2022-07-16 DIAGNOSIS — K260 Acute duodenal ulcer with hemorrhage: Secondary | ICD-10-CM | POA: Diagnosis not present

## 2022-07-16 NOTE — Progress Notes (Signed)
Jimmy [name], Jimmy [name] ([id number]) Visit Report for 07/16/2022 SuperBill Details Patient Name: Date of Service: [name] 07/16/2022 Medical Record Number: [id number] Patient Account Number: [id number] Date of Birth/Sex: Treating RN: 08-02-1966 (56 y.o. 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, unspecified Z21 Asymptomatic human immunodeficiency virus [HIV] infection status K26.0 Acute duodenal ulcer with hemorrhage Facility Procedures CPT4 Code Description Modifier Quantity [id number]-(Facility Use Only) HBOT full body chamber, , 4 ICD-10 Diagnosis Description M86.68 Other chronic osteomyelitis, other site Z87.828 Personal history of other (healed) physical injury and trauma 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

## 2022-07-16 NOTE — Progress Notes (Addendum)
LEARY, MCNULTY ([id number]) Visit Report for 07/16/2022 HBO Details Patient Name: Date of Service: [name] 07/16/2022 10:00 A M Medical Record Number: [id number] Patient Account Number: [id number] Date of Birth/Sex: Treating RN: [birth year] (56 y.o. 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 HBO Treatment Details Treatment Number: 48 Patient Type: Outpatient Chamber Type: Monoplace Chamber Serial #: [id number] Treatment Protocol: 2.0 ATA with 90 minutes oxygen, with two 5 minute air breaks Treatment Details Compression Rate Down: 2.0 psi / minute De-Compression Rate Up: 2.0 psi / minute A breaks and breathing ir Compress Tx Pressure periods Decompress Decompress Begins Reached (leave unused spaces Begins Ends blank) Chamber Pressure (ATA 1 2 2 2 2 2  --2 1 ) Clock Time (24 hr) 10:05 10:13 10:43 10:48 11:18 11:23 - - 11:53 12:01 Treatment Length: 116 (minutes) Treatment Segments: 4 Vital Signs Capillary Blood Glucose Reference Range: 80 - 120 mg / dl HBO Diabetic Blood Glucose Intervention Range: <131 mg/dl or mg/dl Type: Time Vitals Blood Respiratory Capillary Blood Glucose Pulse Action Pulse: Temperature: Taken: Pressure: Rate: Glucose (mg/dl): Meter #: Oximetry (%) Taken: Pre 09:51 113/72 81 20 98.2 none per protocol Post 12:03 131/78 75 18 97.7 none per protocol Treatment Response Treatment Toleration: Well Treatment Completion Status: Treatment Completed without Adverse Event Physician HBO Attestation: I certify that I supervised this HBO treatment in accordance with  Medicare guidelines. A trained emergency response team is readily available per Yes hospital policies and procedures. Continue HBOT as ordered. Yes Electronic Signature(s) Signed: 07/16/2022 12:19:33 PM By: 07/18/2022 MD FACS Previous Signature: 07/16/2022 12:11:12 PM Version By: 07/18/2022 CHT EMT BS , , Previous Signature: 07/16/2022 11:59:29 AM Version By: 07/18/2022 CHT EMT BS , , Entered By: [name] on 07/16/2022 12:19:33 -------------------------------------------------------------------------------- HBO Safety Checklist Details Patient Name: Date of Service: 07/18/2022 [name] 07/16/2022 10:00 A M Medical Record Number: 07/18/2022 Patient Account Number: [id number] Date of Birth/Sex: Treating RN: 05-16-1966 (56 y.o. 54 Primary Care Antavious Spanos: Jimmy [name] Other Clinician: [name] Referring Omarii Scalzo: Treating Liela Rylee/Extender: [name] in Treatment: 11 HBO Safety Checklist Items Safety Checklist Consent Form Signed Patient voided / foley secured and emptied When did you last eato 0800 Last dose of injectable or oral agent n/a Ostomy pouch emptied and vented if applicable NA All implantable devices assessed, documented and approved NA Intravenous access site secured and place NA Valuables secured Linens and cotton and cotton/polyester blend (less than 51% polyester) Personal oil-based products / skin lotions / body lotions removed Wigs or hairpieces removed NA Smoking or tobacco materials removed NA Books / newspapers / magazines / loose paper removed Cologne, aftershave, perfume and deodorant removed Jewelry removed (may wrap wedding band) Make-up removed NA Hair care products removed Battery operated devices (external) removed Heating patches and chemical warmers removed Titanium eyewear removed NA Nail polish cured greater than 10 hours NA Casting material cured greater  than 10 hours NA Hearing aids removed NA Loose dentures or partials removed NA Prosthetics have been removed NA Patient demonstrates correct use of air break device (if applicable) Patient concerns have been addressed Patient grounding bracelet on and cord attached to chamber Specifics for Inpatients (complete in  addition to above) Medication sheet sent with patient NA Intravenous medications needed or due during therapy sent with patient NA Drainage tubes (e.g. nasogastric tube or chest tube secured and vented) NA Endotracheal or Tracheotomy tube secured NA Cuff deflated of air and inflated with saline NA Airway suctioned NA Notes Paper version used prior to treatment. Electronic Signature(s) Signed: 07/16/2022 11:58:22 AM By: [name] CHT EMT BS , , Entered By: [name] on 07/16/2022 11:58:22

## 2022-07-16 NOTE — Progress Notes (Addendum)
Jimmy [name], Jimmy [name] ([id number]) Visit Report for 07/16/2022 Arrival Information Details Patient Name: Date of Service: Jimmy [name]. 07/16/2022 10:00 A M Medical Record Number: [id number] Patient Account Number: [id number] Date of Birth/Sex: Treating RN: [birth year] (56 y.o. Jimmy [name], Jimmy [name] Primary Care Labella Zahradnik: [name] Other Clinician: [name] Referring Shantrell Placzek: Treating Jules Vidovich/Extender: [name] in Treatment: 11 Visit Information History Since Last Visit All ordered tests and consults were completed: Yes Patient Arrived: Ambulatory Added or deleted any medications: No Arrival Time: 09:44 Any new allergies or adverse reactions: No Accompanied By: self Had a fall or experienced change in No Transfer Assistance: None activities of daily living that may affect Patient Identification Verified: Yes risk of falls: Secondary Verification Process Completed: Yes Signs or symptoms of abuse/neglect since last visito No Patient Requires Transmission-Based Precautions: No Hospitalized since last visit: No Patient Has Alerts: No Implantable device outside of the clinic excluding No cellular tissue based products placed in the center since last visit: Pain Present Now: No Electronic Signature(s) Signed: 07/16/2022 11:57:06 AM By: [name] CHT EMT BS , , Entered By: [name] on 07/16/2022 11:57:06 -------------------------------------------------------------------------------- Encounter Discharge Information Details Patient Name: Date of Service: Jimmy [name]. 07/16/2022 10:00 A M Medical Record Number: [id number] Patient Account Number: [id number] Date of Birth/Sex: Treating RN: [birth year] (56 y.o. Jimmy [name] Primary Care Sharon Rubis: [name] Other Clinician: [name] Referring Loreal Schuessler: Treating Calle Schader/Extender: [name] in Treatment: 11 Encounter  Discharge Information Items Discharge Condition: Stable Ambulatory Status: Ambulatory Discharge Destination: Home Transportation: Private Auto Accompanied By: spouse Schedule Follow-up Appointment: No Clinical Summary of Care: Electronic Signature(s) Signed: 07/16/2022 12:14:33 PM By: [name] CHT EMT BS , , Entered By: [name] on 07/16/2022 12:14:33 -------------------------------------------------------------------------------- Vitals Details Patient Name: Date of Service: Jimmy [name]. 07/16/2022 10:00 A M Medical Record Number: [id number] Patient Account Number: [id number] Date of Birth/Sex: Treating RN: [birth year] (56 y.o. Jimmy [name] Primary Care Sondra Blixt: [name] Other Clinician: [name] Referring Ketara Cavness: Treating Aanyah Loa/Extender: [name] in Treatment: 11 Vital Signs Time Taken: 09:51 Temperature (F): 98.2 Height (in): 71 Pulse (bpm): 81 Weight (lbs): 190 Respiratory Rate (breaths/min): 20 Body Mass Index (BMI): 26.5 Blood Pressure (mmHg): 113/72 Reference Range: 80 - 120 mg / dl Electronic Signature(s) Signed: 07/16/2022 11:57:26 AM By: [name] CHT EMT BS , , Entered By: [name] on 07/16/2022 11:57:26

## 2022-07-17 ENCOUNTER — Encounter (HOSPITAL_BASED_OUTPATIENT_CLINIC_OR_DEPARTMENT_OTHER): Payer: BC Managed Care – PPO | Attending: Internal Medicine | Admitting: Internal Medicine

## 2022-07-17 DIAGNOSIS — M269 Dentofacial anomaly, unspecified: Secondary | ICD-10-CM | POA: Diagnosis not present

## 2022-07-17 DIAGNOSIS — E785 Hyperlipidemia, unspecified: Secondary | ICD-10-CM | POA: Diagnosis not present

## 2022-07-17 DIAGNOSIS — Z21 Asymptomatic human immunodeficiency virus [HIV] infection status: Secondary | ICD-10-CM

## 2022-07-17 DIAGNOSIS — Z86718 Personal history of other venous thrombosis and embolism: Secondary | ICD-10-CM | POA: Insufficient documentation

## 2022-07-17 DIAGNOSIS — I10 Essential (primary) hypertension: Secondary | ICD-10-CM | POA: Diagnosis not present

## 2022-07-17 DIAGNOSIS — M8668 Other chronic osteomyelitis, other site: Secondary | ICD-10-CM | POA: Diagnosis not present

## 2022-07-17 DIAGNOSIS — Z87828 Personal history of other (healed) physical injury and trauma: Secondary | ICD-10-CM | POA: Diagnosis not present

## 2022-07-17 NOTE — Progress Notes (Addendum)
CRIT, OBREMSKI ([id number]) Visit Report for 07/17/2022 Arrival Information Details Patient Name: Date of Service: [name] 07/17/2022 10:00 A M Medical Record Number: [id number] Patient Account Number: [id number] Date of Birth/Sex: Treating RN: 14-Aug-1966 (56 y.o. Dwaine Deter, [name] Primary Care Marianela Mandrell: [name] Other Clinician: [name] Referring Deedee Lybarger: Treating Kerria Sapien/Extender: [name] in Treatment: 12 Visit Information History Since Last Visit All ordered tests and consults were completed: Yes Patient Arrived: Ambulatory Added or deleted any medications: No Arrival Time: 09:38 Any new allergies or adverse reactions: No Accompanied By: Wife Had a fall or experienced change in No Transfer Assistance: None activities of daily living that may affect Patient Identification Verified: Yes risk of falls: Secondary Verification Process Completed: Yes Signs or symptoms of abuse/neglect since last visito No Patient Requires Transmission-Based Precautions: No Hospitalized since last visit: No Patient Has Alerts: No Implantable device outside of the clinic excluding No cellular tissue based products placed in the center since last visit: Pain Present Now: No Electronic Signature(s) Signed: 07/17/2022 12:51:55 PM By: [name] EMT Entered By: [name] on 07/17/2022 12:51:55 -------------------------------------------------------------------------------- Encounter Discharge Information Details Patient Name: Date of Service: [name] 07/17/2022 10:00 A M Medical Record Number: [id number] Patient Account Number: [id number] Date of Birth/Sex: Treating RN: [birth year] (56 y.o. [name] Primary Care Analeise Mccleery: [name] Other Clinician: [name] Referring Camilla Skeen: Treating Saide Lanuza/Extender: [name] in Treatment: 12 Encounter Discharge Information Items Discharge  Condition: Stable Ambulatory Status: Ambulatory Discharge Destination: Home Transportation: Private Auto Accompanied By: None Schedule Follow-up Appointment: Yes Clinical Summary of Care: Electronic Signature(s) Signed: 07/17/2022 12:56:22 PM By: [name] EMT Entered By: [name] on 07/17/2022 12:56:22 -------------------------------------------------------------------------------- Vitals Details Patient Name: Date of Service: [name] 07/17/2022 10:00 A M Medical Record Number: [id number] Patient Account Number: [id number] Date of Birth/Sex: Treating RN: 08/07/1966 (56 y.o. [name] Primary Care Mariene Dickerman: [name] Other Clinician: [name] Referring Lamia Mariner: Treating Budd Freiermuth/Extender: [name] in Treatment: 12 Vital Signs Time Taken: 10:01 Temperature (F): 98.2 Height (in): 71 Pulse (bpm): 82 Weight (lbs): 190 Respiratory Rate (breaths/min): 18 Body Mass Index (BMI): 26.5 Blood Pressure (mmHg): 126/71 Reference Range: 80 - 120 mg / dl Electronic Signature(s) Signed: 07/17/2022 12:52:22 PM By: [name] EMT Entered By: [name] on 07/17/2022 12:52:22

## 2022-07-17 NOTE — Progress Notes (Signed)
JOSEY, FORCIER ([id number]) Visit Report for 07/17/2022 HBO Details Patient Name: Date of Service: [name] 07/17/2022 10:00 A M Medical Record Number: [id number] Patient Account Number: [id number] Date of Birth/Sex: Treating RN: [birth year] (56 y.o. 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 HBO Treatment Details Treatment Number: 49 Patient Type: Outpatient Chamber Type: Monoplace Chamber Serial #: [id number] Treatment Protocol: 2.0 ATA with 90 minutes oxygen, with two 5 minute air breaks Treatment Details Compression Rate Down: 2.0 psi / minute De-Compression Rate Up: 2.0 psi / minute A breaks and breathing ir Compress Tx Pressure periods Decompress Decompress Begins Reached (leave unused spaces Begins Ends blank) Chamber Pressure (ATA 1 2 2 2 2 2  --2 1 ) Clock Time (24 hr) 10:32 10:41 11:11 11:16 11:46 11:51 - - 12:21 12:27 Treatment Length: 115 (minutes) Treatment Segments: 4 Vital Signs Capillary Blood Glucose Reference Range: 80 - 120 mg / dl HBO Diabetic Blood Glucose Intervention Range: <131 mg/dl or mg/dl Time Vitals Blood Respiratory Capillary Blood Glucose Pulse Action Type: Pulse: Temperature: Taken: Pressure: Rate: Glucose (mg/dl): Meter #: Oximetry (%) Taken: Pre 10:01 126/71 82 18 98.2 Post 12:30 123/93 74 16 97 Treatment Response Treatment Toleration: Well Treatment Completion Status: Treatment Completed without Adverse Event Physician HBO Attestation: I certify that I supervised this HBO treatment in accordance with Medicare guidelines. A trained emergency response team is  readily available per Yes hospital policies and procedures. Continue HBOT as ordered. Yes Electronic Signature(s) Signed: 07/21/2022 12:52:44 PM By: 09/20/2022 DO Previous Signature: 07/17/2022 12:55:13 PM Version By: 09/16/2022 EMT Previous Signature: 07/21/2022 12:26:30 PM Version By: 09/20/2022 DO Entered By: Geralyn Corwin on 07/21/2022 12:51:27 -------------------------------------------------------------------------------- HBO Safety Checklist Details Patient Name: Date of Service: DA 09/20/2022 [name] 07/17/2022 10:00 A M Medical Record Number: 09/16/2022 Patient Account Number: [id number] Date of Birth/Sex: Treating RN: August 23, 1966 (56 y.o. 54 Primary Care Soloman Mckeithan: [name] Other Clinician: [name] Referring Costella Schwarz: Treating Raiya Stainback/Extender: [name] in Treatment: 12 HBO Safety Checklist Items Safety Checklist Consent Form Signed Patient voided / foley secured and emptied When did you last eato 0800 Last dose of injectable or oral agent NA Ostomy pouch emptied and vented if applicable NA All implantable devices assessed, documented and approved NA Intravenous access site secured and place NA Valuables secured Linens and cotton and cotton/polyester blend (less than 51% polyester) Personal oil-based products / skin lotions / body lotions removed Wigs or hairpieces removed NA Smoking or tobacco materials removed Books / newspapers / magazines / loose paper removed Cologne, aftershave, perfume and deodorant removed Jewelry removed (may wrap wedding band) NA Make-up removed NA Hair care products removed Battery operated devices (external) removed Heating patches and chemical warmers removed Titanium eyewear removed NA Nail polish cured greater than 10 hours NA Casting material cured greater than 10 hours NA Hearing aids removed NA Loose dentures or partials removed NA Prosthetics have  been removed NA Patient demonstrates correct use of air break device (if applicable) Patient concerns have been addressed Patient grounding bracelet on and cord attached to chamber Specifics for Inpatients (complete in addition to above) Medication sheet sent with patient NA Intravenous medications needed or due during  therapy sent with patient NA Drainage tubes (e.g. nasogastric tube or chest tube secured and vented) NA Endotracheal or Tracheotomy tube secured NA Cuff deflated of air and inflated with saline NA Airway suctioned NA Notes The safety checklist was done before the treatment was started. Electronic Signature(s) Signed: 07/17/2022 12:54:06 PM By: [name] EMT Entered By: [name] on 07/17/2022 12:54:06

## 2022-07-21 NOTE — Progress Notes (Signed)
KRUE, PETERKA ([id number]) Visit Report for 07/17/2022 Problem List Details Patient Name: Date of Service: [name] 07/17/2022 10:00 A M Medical Record Number: [id number] Patient Account Number: [id number] Date of Birth/Sex: Treating RN: 05-Nov-1966 (56 y.o. 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 07/17/2022 Medical Record Number: [id number] Patient Account Number: [id number] Date of Birth/Sex: Treating RN: June 18, 1966 (56 y.o. 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, unspecified Z21 Asymptomatic human immunodeficiency virus [HIV] infection status K26.0 Acute duodenal ulcer with hemorrhage Facility  Procedures CPT4 Code: [id number] Description: G0277-(Facility Use Only) HBOT full body chamber, , ICD-10 Diagnosis Description M86.68 Other chronic osteomyelitis, other site Z87.828 Personal history of other (healed) physical injury and trauma 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,  unspecified Z21 Asymptomatic human immunodeficiency virus [HIV] infection status Modifier: Quantity: 4 Physician Procedures : CPT4 Code Description Modifier [id number] - WC PHYS HYPERBARIC OXYGEN THERAPY ICD-10 Diagnosis Description M86.68 Other chronic osteomyelitis, other site Z87.828 Personal history of other (healed) physical injury and trauma M26.9 Dentofacial  anomaly, unspecified Z21 Asymptomatic human immunodeficiency virus [HIV] infection status Quantity: 1 Electronic Signature(s) Signed: 07/17/2022 12:55:47 PM By: [name] EMT Signed: 07/21/2022 12:26:30 PM By: Geralyn Corwin DO Entered By: [name] on 07/17/2022 12:55:47

## 2022-07-22 ENCOUNTER — Telehealth: Payer: Self-pay

## 2022-07-22 NOTE — Patient Outreach (Signed)
  Care Coordination   Initial Visit Note   07/22/2022 Name: Jimmy [name] MRN: [id number] DOB: Jul 10, 1966  Jimmy [name] is a 56 y.o. year old male who sees [name], MD for primary care. I spoke with  [name] by phone today. Patient provided permission for me to speak with wife, Lupita Leash.  What matters to the patients health and wellness today?  Reviewed [facility] care coordination program with wife Lupita Leash. Lupita Leash reports patient is doing as well as to be expected and she denies needs at this time. I encouraged patient and or wife to call back if they change their minds.   SDOH assessments and interventions completed:  No     Care Coordination Interventions Activated:  No  Care Coordination Interventions:  No, not indicated   Follow up plan: No further intervention required.   Encounter Outcome:  Pt. Refused  Rowe Pavy, RN, BSN, CEN [facility] [employer] [phone]

## 2022-07-27 ENCOUNTER — Encounter (HOSPITAL_BASED_OUTPATIENT_CLINIC_OR_DEPARTMENT_OTHER): Payer: BC Managed Care – PPO | Admitting: Internal Medicine

## 2022-07-28 ENCOUNTER — Encounter (HOSPITAL_BASED_OUTPATIENT_CLINIC_OR_DEPARTMENT_OTHER): Payer: BC Managed Care – PPO | Admitting: Internal Medicine

## 2022-07-28 DIAGNOSIS — Z86718 Personal history of other venous thrombosis and embolism: Secondary | ICD-10-CM | POA: Diagnosis not present

## 2022-07-28 DIAGNOSIS — M269 Dentofacial anomaly, unspecified: Secondary | ICD-10-CM

## 2022-07-28 DIAGNOSIS — I10 Essential (primary) hypertension: Secondary | ICD-10-CM | POA: Diagnosis not present

## 2022-07-28 DIAGNOSIS — M8668 Other chronic osteomyelitis, other site: Secondary | ICD-10-CM | POA: Diagnosis not present

## 2022-07-28 DIAGNOSIS — Z87828 Personal history of other (healed) physical injury and trauma: Secondary | ICD-10-CM | POA: Diagnosis not present

## 2022-07-28 DIAGNOSIS — E785 Hyperlipidemia, unspecified: Secondary | ICD-10-CM | POA: Diagnosis not present

## 2022-07-28 NOTE — Progress Notes (Addendum)
MACLIN, GUERRETTE ([id number]) Visit Report for 07/28/2022 Arrival Information Details Patient Name: Date of Service: [name] 07/28/2022 10:00 A M Medical Record Number: [id number] Patient Account Number: [id number] Date of Birth/Sex: Treating RN: 1966-06-11 (56 y.o. [name], [name] Primary Care Corydon Schweiss: [name] Other Clinician: [name] Referring Eoin Willden: Treating Ladashia Demarinis/Extender: [name] in Treatment: 13 Visit Information History Since Last Visit All ordered tests and consults were completed: Yes Patient Arrived: Ambulatory Added or deleted any medications: No Arrival Time: 09:37 Any new allergies or adverse reactions: No Accompanied By: Wife Had a fall or experienced change in No Transfer Assistance: None activities of daily living that may affect Patient Identification Verified: Yes risk of falls: Secondary Verification Process Completed: Yes Signs or symptoms of abuse/neglect since last visito No Patient Requires Transmission-Based Precautions: No Hospitalized since last visit: No Patient Has Alerts: No Implantable device outside of the clinic excluding No cellular tissue based products placed in the center since last visit: Pain Present Now: No Electronic Signature(s) Signed: 07/28/2022 2:04:19 PM By: [name] EMT Entered By: [name] on 07/28/2022 14:04:19 -------------------------------------------------------------------------------- Encounter Discharge Information Details Patient Name: Date of Service: [name] 07/28/2022 10:00 A M Medical Record Number: [id number] Patient Account Number: [id number] Date of Birth/Sex: Treating RN: 1966/04/25 (56 y.o. [name] Primary Care Andrius Andrepont: [name] Other Clinician: [name] Referring Shoshannah Faubert: Treating Harshita Bernales/Extender: [name] in Treatment: 13 Encounter Discharge Information  Items Discharge Condition: Stable Ambulatory Status: Ambulatory Discharge Destination: Home Transportation: Private Auto Accompanied By: Wife Schedule Follow-up Appointment: Yes Clinical Summary of Care: Electronic Signature(s) Signed: 07/28/2022 2:41:38 PM By: [name] EMT Entered By: [name] on 07/28/2022 14:41:38 -------------------------------------------------------------------------------- Vitals Details Patient Name: Date of Service: [name] 07/28/2022 10:00 A M Medical Record Number: [id number] Patient Account Number: [id number] Date of Birth/Sex: Treating RN: June 22, 1966 (56 y.o. [name] Primary Care Lanie Schelling: [name] Other Clinician: [name] Referring Leba Tibbitts: Treating Camay Pedigo/Extender: [name] in Treatment: 13 Vital Signs Time Taken: 09:47 Temperature (F): 98.0 Height (in): 71 Pulse (bpm): 84 Weight (lbs): 190 Respiratory Rate (breaths/min): 18 Body Mass Index (BMI): 26.5 Blood Pressure (mmHg): 123/84 Reference Range: 80 - 120 mg / dl Electronic Signature(s) Signed: 07/28/2022 2:04:47 PM By: [name] EMT Entered By: [name] on 07/28/2022 14:04:46

## 2022-07-28 NOTE — Progress Notes (Signed)
[name], [name] ([id number]) Visit Report for 07/28/2022 Problem List Details Patient Name: Date of Service: [name] 07/28/2022 10:00 A M Medical Record Number: [id number] Patient Account Number: [id number] Date of Birth/Sex: Treating RN: 05-26-1966 (56 y.o. 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 07/28/2022 Medical Record Number: [id number] Patient Account Number: [id number] Date of Birth/Sex: Treating RN: Aug 15, 1966 (56 y.o. 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, unspecified Z21 Asymptomatic human immunodeficiency virus [HIV] infection status K26.0 Acute duodenal ulcer with hemorrhage Facility  Procedures CPT4 Code: [id number] Description: G0277-(Facility Use Only) HBOT full body chamber, , ICD-10 Diagnosis Description M86.68 Other chronic osteomyelitis, other site Z87.828 Personal history of other (healed) physical injury and trauma 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

## 2022-07-28 NOTE — Progress Notes (Addendum)
Jimmy [name], Jimmy [name] ([id number]) Visit Report for 07/28/2022 HBO Details Patient Name: Date of Service: [name] 07/28/2022 10:00 A M Medical Record Number: [id number] Patient Account Number: [id number] Date of Birth/Sex: Treating RN: Mar 16, 1966 (56 y.o. 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 HBO Treatment Details Treatment Number: 50 Patient Type: Outpatient Chamber Type: Monoplace Chamber Serial #: [id number] Treatment Protocol: 2.0 ATA with 90 minutes oxygen, with two 5 minute air breaks Treatment Details Compression Rate Down: 2.0 psi / minute De-Compression Rate Up: 2.0 psi / minute A breaks and breathing ir Compress Tx Pressure periods Decompress Decompress Begins Reached (leave unused spaces Begins Ends blank) Chamber Pressure (ATA 1 2 2 2 2 2  --2 1 ) Clock Time (24 hr) 10:16 10:24 10:54 10:59 11:29 11:34 - - 12:04 12:11 Treatment Length: 115 (minutes) Treatment Segments: 4 Vital Signs Capillary Blood Glucose Reference Range: 80 - 120 mg / dl HBO Diabetic Blood Glucose Intervention Range: <131 mg/dl or mg/dl Time Vitals Blood Respiratory Capillary Blood Glucose Pulse Action Type: Pulse: Temperature: Taken: Pressure: Rate: Glucose (mg/dl): Meter #: Oximetry (%) Taken: Pre 09:47 123/84 84 18 98 Post 12:13 124/76 73 18 97.6 Treatment Response Treatment Toleration: Well Treatment Completion Status: Treatment Completed without Adverse Event Physician HBO Attestation: I certify that I supervised this HBO treatment in accordance with Medicare guidelines. A trained emergency response  team is readily available per Yes hospital policies and procedures. Continue HBOT as ordered. Yes Electronic Signature(s) Signed: 07/28/2022 3:59:59 PM By: 09/27/2022 DO Previous Signature: 07/28/2022 2:36:26 PM Version By: 09/27/2022 EMT Entered By: Jimmy [name] on 07/28/2022 15:41:22 -------------------------------------------------------------------------------- HBO Safety Checklist Details Patient Name: Date of Service: 09/27/2022 [name] 07/28/2022 10:00 A M Medical Record Number: 09/27/2022 Patient Account Number: [id number] Date of Birth/Sex: Treating RN: 12-Dec-1965 (56 y.o. 53 Primary Care [name]: Jimmy [name] Other Clinician: Alinda [name] Referring Kaliah Haddaway: Treating Curvin Hunger/Extender: Jimmy [name] in Treatment: 13 HBO Safety Checklist Items Safety Checklist Consent Form Signed Patient voided / foley secured and emptied When did you last eato 0800 Last dose of injectable or oral agent NA Ostomy pouch emptied and vented if applicable NA All implantable devices assessed, documented and approved NA Intravenous access site secured and place NA Valuables secured Linens and cotton and cotton/polyester blend (less than 51% polyester) Personal oil-based products / skin lotions / body lotions removed Wigs or hairpieces removed NA Smoking or tobacco materials removed Books / newspapers / magazines / loose paper removed Cologne, aftershave, perfume and deodorant removed Jewelry removed (may wrap wedding band) NA Make-up removed NA Hair care products removed NA Battery operated devices (external) removed Heating patches and chemical warmers removed Titanium eyewear removed NA Nail polish cured greater than 10 hours NA Casting material cured greater than 10 hours NA Hearing aids removed NA Loose dentures or partials removed NA Prosthetics have been removed NA Patient demonstrates correct use of air  break device (if applicable) Patient concerns have been addressed Patient grounding bracelet on and cord attached to chamber Specifics for Inpatients (complete in addition to above) Medication sheet sent with patient NA Intravenous medications needed or due during therapy sent with patient NA Drainage tubes (e.g. nasogastric  tube or chest tube secured and vented) NA Endotracheal or Tracheotomy tube secured NA Cuff deflated of air and inflated with saline NA Airway suctioned NA Notes The safety checklist was done before the treatment was started. Electronic Signature(s) Signed: 07/28/2022 2:05:35 PM By: Jimmy [name] EMT Entered By: Jimmy [name] on 07/28/2022 14:05:34

## 2022-07-29 ENCOUNTER — Encounter: Payer: Self-pay | Admitting: Infectious Disease

## 2022-07-29 ENCOUNTER — Ambulatory Visit: Payer: BC Managed Care – PPO | Admitting: Infectious Disease

## 2022-07-29 ENCOUNTER — Ambulatory Visit (INDEPENDENT_AMBULATORY_CARE_PROVIDER_SITE_OTHER): Payer: BC Managed Care – PPO | Admitting: Infectious Disease

## 2022-07-29 ENCOUNTER — Encounter (HOSPITAL_BASED_OUTPATIENT_CLINIC_OR_DEPARTMENT_OTHER): Payer: BC Managed Care – PPO | Admitting: General Surgery

## 2022-07-29 ENCOUNTER — Other Ambulatory Visit: Payer: Self-pay

## 2022-07-29 VITALS — BP 127/77 | HR 86 | Resp 16 | Ht 71.0 in | Wt 205.0 lb

## 2022-07-29 DIAGNOSIS — M272 Inflammatory conditions of jaws: Secondary | ICD-10-CM | POA: Diagnosis not present

## 2022-07-29 DIAGNOSIS — M269 Dentofacial anomaly, unspecified: Secondary | ICD-10-CM | POA: Diagnosis not present

## 2022-07-29 DIAGNOSIS — M8668 Other chronic osteomyelitis, other site: Secondary | ICD-10-CM | POA: Diagnosis not present

## 2022-07-29 DIAGNOSIS — B20 Human immunodeficiency virus [HIV] disease: Secondary | ICD-10-CM

## 2022-07-29 DIAGNOSIS — I82621 Acute embolism and thrombosis of deep veins of right upper extremity: Secondary | ICD-10-CM | POA: Diagnosis not present

## 2022-07-29 DIAGNOSIS — Z86718 Personal history of other venous thrombosis and embolism: Secondary | ICD-10-CM | POA: Diagnosis not present

## 2022-07-29 DIAGNOSIS — K269 Duodenal ulcer, unspecified as acute or chronic, without hemorrhage or perforation: Secondary | ICD-10-CM | POA: Diagnosis not present

## 2022-07-29 DIAGNOSIS — Z880 Allergy status to penicillin: Secondary | ICD-10-CM

## 2022-07-29 DIAGNOSIS — I10 Essential (primary) hypertension: Secondary | ICD-10-CM | POA: Diagnosis not present

## 2022-07-29 DIAGNOSIS — E785 Hyperlipidemia, unspecified: Secondary | ICD-10-CM | POA: Diagnosis not present

## 2022-07-29 MED ORDER — BIKTARVY 50-200-25 MG PO TABS: 1.0000 | ORAL_TABLET | Freq: Every day | ORAL | 11 refills | Status: DC

## 2022-07-29 MED ORDER — METRONIDAZOLE 500 MG PO TABS: 500.0000 mg | ORAL_TABLET | Freq: Two times a day (BID) | ORAL | 5 refills | Status: DC

## 2022-07-29 MED ORDER — CEFADROXIL 500 MG PO CAPS: 1000.0000 mg | ORAL_CAPSULE | Freq: Two times a day (BID) | ORAL | 11 refills | Status: DC

## 2022-07-29 NOTE — Progress Notes (Addendum)
Jimmy [name], Jimmy [name] ([id number]) Visit Report for 07/29/2022 Arrival Information Details Patient Name: Date of Service: Jimmy [name]. 07/29/2022 10:00 A M Medical Record Number: [id number] Patient Account Number: [id number] Date of Birth/Sex: Treating RN: 01/06/1966 (56 y.o. Jimmy [name] Primary Care Palyn Scrima: [name] Other Clinician: [name] Referring Camil Wilhelmsen: Treating Jermell Holeman/Extender: [name] in Treatment: 13 Visit Information History Since Last Visit All ordered tests and consults were completed: Yes Patient Arrived: Ambulatory Added or deleted any medications: No Arrival Time: 09:44 Any new allergies or adverse reactions: No Accompanied By: spouse Had a fall or experienced change in No Transfer Assistance: None activities of daily living that may affect Patient Identification Verified: Yes risk of falls: Secondary Verification Process Completed: Yes Signs or symptoms of abuse/neglect since last visito No Patient Requires Transmission-Based Precautions: No Hospitalized since last visit: No Patient Has Alerts: No Implantable device outside of the clinic excluding No cellular tissue based products placed in the center since last visit: Pain Present Now: No Electronic Signature(s) Signed: 07/29/2022 2:26:24 PM By: [name] CHT EMT BS , , Entered By: [name] on 07/29/2022 14:26:24 -------------------------------------------------------------------------------- Encounter Discharge Information Details Patient Name: Date of Service: Jimmy [name]. 07/29/2022 10:00 A M Medical Record Number: [id number] Patient Account Number: [id number] Date of Birth/Sex: Treating RN: Nov 06, 1966 (56 y.o. Jimmy [name] Primary Care Rosanna Bickle: [name] Other Clinician: [name] Referring Boris Engelmann: Treating Deneka Greenwalt/Extender: [name] in Treatment: 13 Encounter  Discharge Information Items Discharge Condition: Stable Ambulatory Status: Ambulatory Discharge Destination: Home Transportation: Private Auto Accompanied By: spouse Schedule Follow-up Appointment: No Clinical Summary of Care: Electronic Signature(s) Signed: 07/29/2022 3:12:40 PM By: [name] CHT EMT BS , , Entered By: [name] on 07/29/2022 15:12:40 -------------------------------------------------------------------------------- Vitals Details Patient Name: Date of Service: Jimmy [name]. 07/29/2022 10:00 A M Medical Record Number: [id number] Patient Account Number: [id number] Date of Birth/Sex: Treating RN: July 20, 1966 (56 y.o. Jimmy [name] Primary Care Crockett Rallo: [name] Other Clinician: [name] Referring Amrom Ore: Treating Blessing Zaucha/Extender: [name] in Treatment: 13 Vital Signs Time Taken: 09:56 Temperature (F): 98.0 Height (in): 71 Pulse (bpm): 87 Weight (lbs): 190 Respiratory Rate (breaths/min): 20 Body Mass Index (BMI): 26.5 Blood Pressure (mmHg): 118/71 Reference Range: 80 - 120 mg / dl Electronic Signature(s) Signed: 07/29/2022 2:26:51 PM By: [name] CHT EMT BS , , Entered By: [name] on 07/29/2022 14:26:50
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
## 2022-07-29 NOTE — Progress Notes (Addendum)
Jimmy [name], Jimmy [name] ([id number]) Visit Report for 07/29/2022 HBO Details Patient Name: Date of Service: [name] 07/29/2022 10:00 A M Medical Record Number: [id number] Patient Account Number: [id number] Date of Birth/Sex: Treating RN: Mar 05, 1966 (56 y.o. 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 HBO Treatment Details Treatment Number: 51 Patient Type: Outpatient Chamber Type: Monoplace Chamber Serial #: [id number] Treatment Protocol: 2.0 ATA with 90 minutes oxygen, with two 5 minute air breaks Treatment Details Compression Rate Down: 2.0 psi / minute De-Compression Rate Up: 2.0 psi / minute A breaks and breathing ir Compress Tx Pressure periods Decompress Decompress Begins Reached (leave unused spaces Begins Ends blank) Chamber Pressure (ATA 1 2 2 2 2 2  --2 1 ) Clock Time (24 hr) 10:14 10:22 10:52 10:57 11:27 11:32 - - 12:02 12:12 Treatment Length: 118 (minutes) Treatment Segments: 4 Vital Signs Capillary Blood Glucose Reference Range: 80 - 120 mg / dl HBO Diabetic Blood Glucose Intervention Range: <131 mg/dl or mg/dl Type: Time Vitals Blood Respiratory Capillary Blood Glucose Pulse Action Pulse: Temperature: Taken: Pressure: Rate: Glucose (mg/dl): Meter #: Oximetry (%) Taken: Pre 09:56 118/71 87 20 98 none per protocol Post 12:12 108/86 73 18 97.7 none per protocol Treatment Response Treatment Toleration: Well Treatment Completion Status: Treatment Completed without Adverse Event Physician HBO Attestation: I certify that I supervised this HBO treatment in accordance with  Medicare guidelines. A trained emergency response team is readily available per Yes hospital policies and procedures. Continue HBOT as ordered. Yes Electronic Signature(s) Signed: 07/29/2022 4:14:36 PM By: 07/31/2022 MD FACS Previous Signature: 07/29/2022 3:11:44 PM Version By: 07/31/2022 CHT EMT BS , , Entered By: [name] on 07/29/2022 16:14:36 -------------------------------------------------------------------------------- HBO Safety Checklist Details Patient Name: Date of Service: 07/31/2022 [name] 07/29/2022 10:00 A M Medical Record Number: 07/31/2022 Patient Account Number: [id number] Date of Birth/Sex: Treating RN: May 23, 1966 (56 y.o. 53 Primary Care Jimmy [name]: Jimmy [name] Other Clinician: [name] Referring Akasia Ahmad: Treating Madisynn Plair/Extender: [name] in Treatment: 13 HBO Safety Checklist Items Safety Checklist Consent Form Signed Patient voided / foley secured and emptied When did you last eato 0800 Last dose of injectable or oral agent n/a Ostomy pouch emptied and vented if applicable NA All implantable devices assessed, documented and approved NA Intravenous access site secured and place NA Valuables secured Linens and cotton and cotton/polyester blend (less than 51% polyester) Personal oil-based products / skin lotions / body lotions removed Wigs or hairpieces removed NA Smoking or tobacco materials removed Books / newspapers / magazines / loose paper removed Cologne, aftershave, perfume and deodorant removed Jewelry removed (may wrap wedding band) Make-up removed NA Hair care products removed Battery operated devices (external) removed Heating patches and chemical warmers removed Titanium eyewear removed NA Nail polish cured greater than 10 hours NA Casting material cured greater than 10 hours NA Hearing aids removed NA Loose dentures or partials removed NA Prosthetics  have been removed NA Patient demonstrates correct use of air break device (if applicable) Patient concerns have been addressed Patient grounding bracelet on and cord attached to chamber Specifics for Inpatients (complete in addition to above) Medication sheet sent with patient NA Intravenous medications needed or due during  therapy sent with patient NA Drainage tubes (e.g. nasogastric tube or chest tube secured and vented) NA Endotracheal or Tracheotomy tube secured NA Cuff deflated of air and inflated with saline NA Airway suctioned NA Notes Paper version used prior to treatment start. Electronic Signature(s) Signed: 07/29/2022 2:38:29 PM By: [name] CHT EMT BS , , Entered By: [name] on 07/29/2022 14:38:29

## 2022-07-30 ENCOUNTER — Encounter (HOSPITAL_BASED_OUTPATIENT_CLINIC_OR_DEPARTMENT_OTHER): Payer: BC Managed Care – PPO | Admitting: General Surgery

## 2022-07-30 DIAGNOSIS — M269 Dentofacial anomaly, unspecified: Secondary | ICD-10-CM | POA: Diagnosis not present

## 2022-07-30 DIAGNOSIS — M8668 Other chronic osteomyelitis, other site: Secondary | ICD-10-CM | POA: Diagnosis not present

## 2022-07-30 DIAGNOSIS — I10 Essential (primary) hypertension: Secondary | ICD-10-CM | POA: Diagnosis not present

## 2022-07-30 DIAGNOSIS — Z86718 Personal history of other venous thrombosis and embolism: Secondary | ICD-10-CM | POA: Diagnosis not present

## 2022-07-30 DIAGNOSIS — E785 Hyperlipidemia, unspecified: Secondary | ICD-10-CM | POA: Diagnosis not present

## 2022-07-30 LAB — T-HELPER CELLS (CD4) COUNT (NOT AT ARMC)
CD4 % Helper T Cell: 48 % (ref 33–65)
CD4 T Cell Abs: 881 /uL (ref 400–1790)

## 2022-07-30 NOTE — Progress Notes (Addendum)
LYDELL, MOGA ([id number]) Visit Report for 07/30/2022 Arrival Information Details Patient Name: Date of Service: [name] 07/30/2022 10:00 A M Medical Record Number: [id number] Patient Account Number: [id number] Date of Birth/Sex: Treating RN: Jan 14, 1966 (56 y.o. [name], [name] Primary Care Evah Rashid: [name] Other Clinician: [name] Referring Yousra Ivens: Treating Deitrich Steve/Extender: [name] in Treatment: 13 Visit Information History Since Last Visit All ordered tests and consults were completed: Yes Patient Arrived: Ambulatory Added or deleted any medications: No Arrival Time: 09:45 Any new allergies or adverse reactions: No Accompanied By: self Had a fall or experienced change in No Transfer Assistance: None activities of daily living that may affect Patient Identification Verified: Yes risk of falls: Secondary Verification Process Completed: Yes Signs or symptoms of abuse/neglect since last visito No Patient Requires Transmission-Based Precautions: No Hospitalized since last visit: No Patient Has Alerts: No Implantable device outside of the clinic excluding No cellular tissue based products placed in the center since last visit: Pain Present Now: No Electronic Signature(s) Signed: 07/30/2022 2:30:41 PM By: [name] CHT EMT BS , , Entered By: [name] on 07/30/2022 14:30:41 -------------------------------------------------------------------------------- Encounter Discharge Information Details Patient Name: Date of Service: [name], [name] 07/30/2022 10:00 A M Medical Record Number: [id number] Patient Account Number: [id number] Date of Birth/Sex: Treating RN: 06-Oct-1966 (56 y.o. [name] Primary Care Keara Pagliarulo: [name] Other Clinician: [name] Referring Tenia Goh: Treating Faizan Geraci/Extender: [name] in Treatment: 13 Encounter Discharge  Information Items Discharge Condition: Stable Ambulatory Status: Ambulatory Discharge Destination: Home Transportation: Private Auto Accompanied By: spouse Schedule Follow-up Appointment: No Clinical Summary of Care: Electronic Signature(s) Signed: 07/30/2022 2:41:18 PM By: [name] CHT EMT BS , , Entered By: [name] on 07/30/2022 14:41:18 -------------------------------------------------------------------------------- Vitals Details Patient Name: Date of Service: [name] 07/30/2022 10:00 A M Medical Record Number: [id number] Patient Account Number: [id number] Date of Birth/Sex: Treating RN: 1966/11/10 (56 y.o. [name], [name] Primary Care Ria Redcay: [name] Other Clinician: [name] Referring Brittanie Dosanjh: Treating Tiffane Sheldon/Extender: [name] in Treatment: 13 Vital Signs Time Taken: 10:10 Temperature (F): 98.2 Height (in): 71 Pulse (bpm): 82 Weight (lbs): 190 Respiratory Rate (breaths/min): 20 Body Mass Index (BMI): 26.5 Blood Pressure (mmHg): 122/86 Reference Range: 80 - 120 mg / dl Electronic Signature(s) Signed: 07/30/2022 2:31:05 PM By: [name] CHT EMT BS , , Entered By: [name] on 07/30/2022 14:31:04

## 2022-07-30 NOTE — Progress Notes (Addendum)
Jimmy [name], Jimmy [name] ([id number]) Visit Report for 07/30/2022 HBO Details Patient Name: Date of Service: [name] 07/30/2022 10:00 A M Medical Record Number: [id number] Patient Account Number: [id number] Date of Birth/Sex: Treating RN: [name] (56 y.o. 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 HBO Treatment Details Treatment Number: 52 Patient Type: Outpatient Chamber Type: Monoplace Chamber Serial #: [id number] Treatment Protocol: 2.0 ATA with 90 minutes oxygen, with two 5 minute air breaks Treatment Details Compression Rate Down: 2.0 psi / minute De-Compression Rate Up: 2.0 psi / minute A breaks and breathing ir Compress Tx Pressure periods Decompress Decompress Begins Reached (leave unused spaces Begins Ends blank) Chamber Pressure (ATA 1 2 2 2 2 2  --2 1 ) Clock Time (24 hr) 10:13 10:21 10:51 10:56 11:26 11:31 - - 12:01 12:11 Treatment Length: 118 (minutes) Treatment Segments: 4 Vital Signs Capillary Blood Glucose Reference Range: 80 - 120 mg / dl HBO Diabetic Blood Glucose Intervention Range: <131 mg/dl or mg/dl Type: Time Vitals Blood Respiratory Capillary Blood Glucose Pulse Action Pulse: Temperature: Taken: Pressure: Rate: Glucose (mg/dl): Meter #: Oximetry (%) Taken: Pre 10:10 122/86 82 20 98.2 none per protocol Post 12:11 113/80 82 20 97.9 none per protocol Treatment Response Treatment Toleration: Well Treatment Completion Status: Treatment Completed without Adverse Event Physician HBO Attestation: I certify that I supervised this HBO treatment in accordance with  Medicare guidelines. A trained emergency response team is readily available per Yes hospital policies and procedures. Continue HBOT as ordered. Yes Electronic Signature(s) Signed: 07/30/2022 3:21:17 PM By: 08/01/2022 MD FACS Previous Signature: 07/30/2022 2:33:29 PM Version By: 08/01/2022 CHT EMT BS , , Entered By: Jimmy [name] on 07/30/2022 15:21:17 -------------------------------------------------------------------------------- HBO Safety Checklist Details Patient Name: Date of Service: 08/01/2022, Jimmy Sauers [name] 07/30/2022 10:00 A M Medical Record Number: 08/01/2022 Patient Account Number: [id number] Date of Birth/Sex: Treating RN: Jimmy [name] (56 y.o. 53, Jimmy [name] Primary Care Dora Simeone: [name] Other Clinician: [name] Referring Saquan Furtick: Treating Caterin Tabares/Extender: Jimmy [name] in Treatment: 13 HBO Safety Checklist Items Safety Checklist Consent Form Signed Patient voided / foley secured and emptied When did you last eato 0800 Last dose of injectable or oral agent n/a Ostomy pouch emptied and vented if applicable NA All implantable devices assessed, documented and approved NA Intravenous access site secured and place NA Valuables secured Linens and cotton and cotton/polyester blend (less than 51% polyester) Personal oil-based products / skin lotions / body lotions removed Wigs or hairpieces removed NA Smoking or tobacco materials removed Books / newspapers / magazines / loose paper removed Cologne, aftershave, perfume and deodorant removed Jewelry removed (Jimmy wrap wedding band) NA Make-up removed NA Hair care products removed Battery operated devices (external) removed Heating patches and chemical warmers removed Titanium eyewear removed NA Nail polish cured greater than 10 hours NA Casting material cured greater than 10 hours NA Hearing aids removed NA Loose dentures or partials  removed NA Prosthetics have been removed NA Patient demonstrates correct use of air break device (if applicable) Patient concerns have been addressed Patient grounding bracelet on and cord attached to chamber Specifics for Inpatients (complete in addition to above) Medication sheet sent with patient NA Intravenous medications needed or due  during therapy sent with patient NA Drainage tubes (e.g. nasogastric tube or chest tube secured and vented) NA Endotracheal or Tracheotomy tube secured NA Cuff deflated of air and inflated with saline NA Airway suctioned NA Notes Paper version used prior to treatment start. Electronic Signature(s) Signed: 07/30/2022 2:32:31 PM By: Jimmy [name] CHT EMT BS , , Entered By: Jimmy [name] on 07/30/2022 14:32:31

## 2022-07-30 NOTE — Progress Notes (Signed)
Jimmy [name], Jimmy [name] ([id number]) Visit Report for 07/30/2022 SuperBill Details Patient Name: Date of Service: [name] 07/30/2022 Medical Record Number: [id number] Patient Account Number: [id number] Date of Birth/Sex: Treating RN: 1966/03/13 (56 y.o. 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, unspecified Z21 Asymptomatic human immunodeficiency virus [HIV] infection status K26.0 Acute duodenal ulcer with hemorrhage Facility Procedures CPT4 Code Description Modifier Quantity [id number]-(Facility Use Only) HBOT full body chamber, , 4 ICD-10 Diagnosis Description M86.68 Other chronic osteomyelitis, other site 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

## 2022-07-31 ENCOUNTER — Encounter (HOSPITAL_BASED_OUTPATIENT_CLINIC_OR_DEPARTMENT_OTHER): Payer: BC Managed Care – PPO | Admitting: General Surgery

## 2022-07-31 DIAGNOSIS — Z86718 Personal history of other venous thrombosis and embolism: Secondary | ICD-10-CM | POA: Diagnosis not present

## 2022-07-31 DIAGNOSIS — M8668 Other chronic osteomyelitis, other site: Secondary | ICD-10-CM | POA: Diagnosis not present

## 2022-07-31 DIAGNOSIS — M269 Dentofacial anomaly, unspecified: Secondary | ICD-10-CM | POA: Diagnosis not present

## 2022-07-31 DIAGNOSIS — E785 Hyperlipidemia, unspecified: Secondary | ICD-10-CM | POA: Diagnosis not present

## 2022-07-31 DIAGNOSIS — I10 Essential (primary) hypertension: Secondary | ICD-10-CM | POA: Diagnosis not present

## 2022-07-31 NOTE — Progress Notes (Signed)
CAESAR, MANNELLA ([id number]) Visit Report for 07/31/2022 Arrival Information Details Patient Name: Date of Service: [name] 07/31/2022 9:00 A M Medical Record Number: [id number] Patient Account Number: [id number] Date of Birth/Sex: Treating RN: December 29, 1965 (56 y.o. [name], [name] Primary Care Mao Lockner: [name] Other Clinician: Referring Guenevere Roorda: Treating Fredy Gladu/Extender: [name] in Treatment: 14 Visit Information History Since Last Visit Added or deleted any medications: No Patient Arrived: Ambulatory Any new allergies or adverse reactions: No Arrival Time: 09:22 Had a fall or experienced change in No Accompanied By: spouse activities of daily living that may affect Transfer Assistance: None risk of falls: Patient Identification Verified: Yes Signs or symptoms of abuse/neglect since last visito No Secondary Verification Process Completed: Yes Hospitalized since last visit: No Patient Requires Transmission-Based Precautions: No Implantable device outside of the clinic excluding No Patient Has Alerts: No cellular tissue based products placed in the center since last visit: Pain Present Now: Yes Electronic Signature(s) Signed: 07/31/2022 3:57:07 PM By: Zenaida Deed RN, BSN Entered By: Zenaida Deed on 07/31/2022 09:26:01 -------------------------------------------------------------------------------- Clinic Level of Care Assessment Details Patient Name: Date of Service: DA LES, MICHA [name] 07/31/2022 9:00 A M Medical Record Number: [id number] Patient Account Number: [id number] Date of Birth/Sex: Treating RN: 1966-11-11 (56 y.o. [name] Primary Care Shadrick Senne: [name] Other Clinician: Referring Maximina Pirozzi: Treating Malone Admire/Extender: [name] in Treatment: 14 Clinic Level of Care Assessment Items TOOL 4 Quantity Score []  - 0 Use when only an EandM is performed on FOLLOW-UP  visit ASSESSMENTS - Nursing Assessment / Reassessment X- 1 10 Reassessment of Co-morbidities (includes updates in patient status) X- 1 5 Reassessment of Adherence to Treatment Plan ASSESSMENTS - Wound and Skin A ssessment / Reassessment []  - 0 Simple Wound Assessment / Reassessment - one wound []  - 0 Complex Wound Assessment / Reassessment - multiple wounds []  - 0 Dermatologic / Skin Assessment (not related to wound area) ASSESSMENTS - Focused Assessment []  - 0 Circumferential Edema Measurements - multi extremities []  - 0 Nutritional Assessment / Counseling / Intervention []  - 0 Lower Extremity Assessment (monofilament, tuning fork, pulses) []  - 0 Peripheral Arterial Disease Assessment (using hand held doppler) ASSESSMENTS - Ostomy and/or Continence Assessment and Care []  - 0 Incontinence Assessment and Management []  - 0 Ostomy Care Assessment and Management (repouching, etc.) PROCESS - Coordination of Care X - Simple Patient / Family Education for ongoing care 1 15 []  - 0 Complex (extensive) Patient / Family Education for ongoing care X- 1 10 Staff obtains , Records, T Results / Process Orders est []  - 0 Staff telephones HHA, Nursing Homes / Clarify orders / etc []  - 0 Routine Transfer to another Facility (non-emergent condition) []  - 0 Routine Hospital Admission (non-emergent condition) []  - 0 New Admissions / / Ordering NPWT Apligraf, etc. , []  - 0 Emergency Hospital Admission (emergent condition) X- 1 10 Simple Discharge Coordination []  - 0 Complex (extensive) Discharge Coordination PROCESS - Special Needs []  - 0 Pediatric / Minor Patient Management []  - 0 Isolation Patient Management []  - 0 Hearing / Language / Visual special needs []  - 0 Assessment of Community assistance (transportation, D/C planning, etc.) []  - 0 Additional assistance / Altered mentation []  - 0 Support Surface(s) Assessment (bed, cushion, seat,  etc.) INTERVENTIONS - Wound Cleansing / Measurement []  - 0 Simple Wound Cleansing - one wound []  - 0 Complex Wound Cleansing - multiple wounds []  - 0 Wound Imaging (  photographs - any number of wounds) []  - 0 Wound Tracing (instead of photographs) []  - 0 Simple Wound Measurement - one wound []  - 0 Complex Wound Measurement - multiple wounds INTERVENTIONS - Wound Dressings []  - 0 Small Wound Dressing one or multiple wounds []  - 0 Medium Wound Dressing one or multiple wounds []  - 0 Large Wound Dressing one or multiple wounds []  - 0 Application of Medications - topical []  - 0 Application of Medications - injection INTERVENTIONS - Miscellaneous []  - 0 External ear exam []  - 0 Specimen Collection (cultures, biopsies, blood, body fluids, etc.) []  - 0 Specimen(s) / Culture(s) sent or taken to Lab for analysis []  - 0 Patient Transfer (multiple staff / / Similar devices) []  - 0 Simple Staple / Suture removal (25 or less) []  - 0 Complex Staple / Suture removal (26 or more) []  - 0 Hypo / Hyperglycemic Management (close monitor of Blood Glucose) []  - 0 Ankle / Brachial Index (ABI) - do not check if billed separately X- 1 5 Vital Signs Has the patient been seen at the hospital within the last three years: Yes Total Score: 55 Level Of Care: New/Established - Level 2 Electronic Signature(s) Signed: 07/31/2022 3:57:07 PM By: RN, BSN Entered By: on 07/31/2022 09:46:29 -------------------------------------------------------------------------------- Encounter Discharge Information Details Patient Name: Date of Service: , [name] 07/31/2022 9:00 A M Medical Record Number: Patient Account Number: Date of Birth/Sex: Treating RN: 12/12/1965 (56 y.o. Nurse, adult Primary Care Jennavieve Arrick: Other Clinician: Referring Annaliesa Blann: Treating Lively Haberman/Extender: in  Treatment: 14 Encounter Discharge Information Items Discharge Condition: Stable Ambulatory Status: Ambulatory Discharge Destination: Home Transportation: Private Auto Accompanied By: self Schedule Follow-up Appointment: Yes Clinical Summary of Care: Patient Declined Electronic Signature(s) Signed: 07/31/2022 3:57:07 PM By: RN, BSN Entered By: 08/02/2022 on 07/31/2022 09:46:59 -------------------------------------------------------------------------------- Lower Extremity Assessment Details Patient Name: Date of Service: DA Zenaida Deed [name] 07/31/2022 9:00 A M Medical Record Number: [name] Patient Account Number: [name] Date of Birth/Sex: Treating RN: 07/13/1966 (56 y.o. [id number] Primary Care  Jon: 01/02/1966 Other Clinician: Referring Jami Ohlin: Treating Kamarri Lovvorn/Extender: 54 in Treatment: 14 Electronic Signature(s) Signed: 07/31/2022 3:57:07 PM By: [name] RN, BSN Entered By: [name] on 07/31/2022 09:27:18 -------------------------------------------------------------------------------- Multi Wound Chart Details Patient Name: Date of Service: Zenaida Deed, Zenaida Deed [name] 07/31/2022 9:00 A M Medical Record Number: Sarita Bottom Patient Account Number: 08/02/2022 Date of Birth/Sex: Treating RN: 07/26/1966 (56 y.o. M) Primary Care Delorse Shane: Other Clinician: 01/02/1966 Referring Granville Whitefield: Treating Anneliese Leblond/Extender: 54 in Treatment: 14 Vital Signs Height(in): 71 Pulse(bpm): 81 Weight(lbs): 190 Blood Pressure(mmHg): 131/76 Body Mass Index(BMI): 26.5 Temperature(F): 98.2 Respiratory Rate(breaths/min): 18 Wound Assessments Treatment Notes Electronic Signature(s) Signed: 07/31/2022 9:52:48 AM By: [name] MD FACS Entered By: [name] on 07/31/2022  09:52:48 -------------------------------------------------------------------------------- Multi-Disciplinary Care Plan Details Patient Name: Date of Service: Zenaida Deed, Zenaida Deed [name] 07/31/2022 9:00 A M Medical Record Number: [name] Patient Account Number: [name] Date of Birth/Sex: Treating RN: [birth year] (56 y.o. [id number] Primary Care Malcolm Hetz: 01/02/1966 Other Clinician: Referring Jaia Alonge: Treating Delani Kohli/Extender: 54 in Treatment: 14 Active Inactive HBO Nursing Diagnoses: Anxiety related to knowledge deficit of hyperbaric oxygen therapy and treatment procedures Goals: Patient and/or family will be able to state/discuss factors appropriate to the management of their disease process during treatment Date Initiated: 07/03/2022 Target  Resolution Date: 10/15/2022 Goal Status: Active Interventions: Assess and provide for patients comfort related to the hyperbaric environment and equalization of middle ear Notes: Wound/Skin Impairment Nursing Diagnoses: Impaired tissue integrity Goals: Patient/caregiver will verbalize understanding of skin care regimen Date Initiated: 04/24/2022 Target Resolution Date: 08/28/2022 Goal Status: Active Interventions: Provide education on ulcer and skin care Treatment Activities: Skin care regimen initiated : 04/24/2022 Notes: Electronic Signature(s) Signed: 07/31/2022 3:57:07 PM By: Zenaida Deed RN, BSN Entered By: Zenaida Deed on 07/31/2022 09:29:09 -------------------------------------------------------------------------------- Pain Assessment Details Patient Name: Date of Service: [name] 07/31/2022 9:00 A M Medical Record Number: [id number] Patient Account Number: [id number] Date of Birth/Sex: Treating RN: [birth year] (56 y.o. [name] Primary Care Avary Eichenberger: [name] Other Clinician: Referring Jerni Selmer: Treating Devonta Blanford/Extender: [name] in Treatment: 14 Active Problems Location of Pain Severity and Description of Pain Patient Has Paino Yes Site Locations Pain Location: Pain in Ulcers Duration of the Pain. Constant / Intermittento Constant Rate the pain. Current Pain Level: 5 Worst Pain Level: 5 Least Pain Level: 5 Character of Pain Describe the Pain: Aching Pain Management and Medication Current Pain Management: Other: tolerable Is the Current Pain Management Adequate: Adequate How does your wound impact your activities of daily livingo Sleep: No Bathing: No Appetite: No Relationship With Others: No Bladder Continence: No Emotions: No Bowel Continence: No Work: No Toileting: No Drive: No Dressing: No Hobbies: No Electronic Signature(s) Signed: 07/31/2022 3:57:07 PM By: Zenaida Deed RN, BSN Entered By: Zenaida Deed on 07/31/2022 09:27:12 -------------------------------------------------------------------------------- Patient/Caregiver Education Details Patient Name: Date of Service: [name] 9/15/2023andnbsp9:00 A M Medical Record Number: [id number] Patient Account Number: [id number] Date of Birth/Gender: Treating RN: November 03, 1966 (56 y.o. [name] Primary Care Physician: [name] Other Clinician: Referring Physician: Treating Physician/Extender: [name] in Treatment: 14 Education Assessment Education Provided To: Patient Education Topics Provided Hyperbaric Oxygenation: Methods: Explain/Verbal Responses: Reinforcements needed, State content correctly Wound/Skin Impairment: Methods: Explain/Verbal Responses: Reinforcements needed, State content correctly Electronic Signature(s) Signed: 07/31/2022 3:57:07 PM By: Zenaida Deed RN, BSN Entered By: Zenaida Deed on 07/31/2022 09:29:35 -------------------------------------------------------------------------------- Vitals Details Patient Name: Date of Service: DA  [name] 07/31/2022 9:00 A M Medical Record Number: [id number] Patient Account Number: [id number] Date of Birth/Sex: Treating RN: 1966/11/16 (56 y.o. [name] Primary Care Annie Roseboom: [name] Other Clinician: Referring Kjersti Dittmer: Treating Adarian Bur/Extender: [name] in Treatment: 14 Vital Signs Time Taken: 09:26 Temperature (F): 98.2 Height (in): 71 Pulse (bpm): 81 Weight (lbs): 190 Respiratory Rate (breaths/min): 18 Body Mass Index (BMI): 26.5 Blood Pressure (mmHg): 131/76 Reference Range: 80 - 120 mg / dl Electronic Signature(s) Signed: 07/31/2022 3:57:07 PM By: Zenaida Deed RN, BSN Entered By: Zenaida Deed on 07/31/2022 09:26:20

## 2022-07-31 NOTE — Progress Notes (Addendum)
MARVELOUS, BOUWENS ([id number]) Visit Report for 07/31/2022 Arrival Information Details Patient Name: Date of Service: [name] 07/31/2022 10:00 A M Medical Record Number: [id number] Patient Account Number: [id number] Date of Birth/Sex: Treating RN: [birth year] (56 y.o. Jimmy [name] Primary Care Amar Sippel: [name] Other Clinician: [name] Referring Charmayne Odell: Treating Cj Beecher/Extender: [name] in Treatment: 14 Visit Information History Since Last Visit All ordered tests and consults were completed: Yes Patient Arrived: Ambulatory Added or deleted any medications: No Arrival Time: 09:02 Any new allergies or adverse reactions: No Accompanied By: spouse Had a fall or experienced change in No Transfer Assistance: None activities of daily living that may affect Patient Identification Verified: Yes risk of falls: Secondary Verification Process Completed: Yes Signs or symptoms of abuse/neglect since last visito No Patient Requires Transmission-Based Precautions: No Hospitalized since last visit: No Patient Has Alerts: No Implantable device outside of the clinic excluding No cellular tissue based products placed in the center since last visit: Pain Present Now: No Electronic Signature(s) Signed: 07/31/2022 12:07:24 PM By: [name] CHT EMT BS , , Entered By: [name] on 07/31/2022 12:07:23 -------------------------------------------------------------------------------- Encounter Discharge Information Details Patient Name: Date of Service: Jimmy [name], Jimmy Birks [name] 07/31/2022 10:00 A M Medical Record Number: [id number] Patient Account Number: [id number] Date of Birth/Sex: Treating RN: January 14, 1966 (56 y.o. Jimmy [name] Primary Care Raden Byington: [name] Other Clinician: [name] Referring Leilanni Halvorson: Treating Keante Urizar/Extender: [name] in Treatment: 14 Encounter  Discharge Information Items Discharge Condition: Stable Ambulatory Status: Ambulatory Discharge Destination: Home Transportation: Private Auto Accompanied By: spouse Schedule Follow-up Appointment: No Clinical Summary of Care: Electronic Signature(s) Signed: 07/31/2022 3:27:45 PM By: [name] CHT EMT BS , , Entered By: [name] on 07/31/2022 15:27:45 -------------------------------------------------------------------------------- Vitals Details Patient Name: Date of Service: Jimmy Nian [name] 07/31/2022 10:00 A M Medical Record Number: [id number] Patient Account Number: [id number] Date of Birth/Sex: Treating RN: 1966-08-31 (56 y.o. Jimmy [name] Primary Care Nial Hawe: [name] Other Clinician: [name] Referring Liane Tribbey: Treating Llewyn Heap/Extender: [name] in Treatment: 14 Vital Signs Time Taken: 10:29 Temperature (F): 98.0 Height (in): 71 Pulse (bpm): 88 Weight (lbs): 190 Respiratory Rate (breaths/min): 20 Body Mass Index (BMI): 26.5 Blood Pressure (mmHg): 136/89 Reference Range: 80 - 120 mg / dl Electronic Signature(s) Signed: 07/31/2022 12:07:51 PM By: [name] CHT EMT BS , , Entered By: [name] on 07/31/2022 12:07:51

## 2022-07-31 NOTE — Progress Notes (Addendum)
Jimmy [name], Jimmy [name] ([id number]) Visit Report for 07/31/2022 HBO Details Patient Name: Date of Service: [name] 07/31/2022 10:00 A M Medical Record Number: [id number] Patient Account Number: [id number] Date of Birth/Sex: Treating RN: [birth year] (56 y.o. 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 HBO Treatment Details Treatment Number: 53 Patient Type: Outpatient Chamber Type: Monoplace Chamber Serial #: [id number] Treatment Protocol: 2.0 ATA with 90 minutes oxygen, with two 5 minute air breaks Treatment Details Compression Rate Down: 2.0 psi / minute De-Compression Rate Up: 2.0 psi / minute A breaks and breathing ir Compress Tx Pressure periods Decompress Decompress Begins Reached (leave unused spaces Begins Ends blank) Chamber Pressure (ATA 1 2 2 2 2 2  --2 1 ) Clock Time (24 hr) 10:41 10:49 11:19 11:24 12:09 12:14 - - 12:30 12:38 Treatment Length: 117 (minutes) Treatment Segments: 4 Vital Signs Capillary Blood Glucose Reference Range: 80 - 120 mg / dl HBO Diabetic Blood Glucose Intervention Range: <131 mg/dl or mg/dl Time Vitals Blood Respiratory Capillary Blood Glucose Pulse Action Type: Pulse: Temperature: Taken: Pressure: Rate: Glucose (mg/dl): Meter #: Oximetry (%) Taken: Pre 10:29 136/89 88 20 98 Post 12:38 122/81 76 20 97.8 Treatment Response Treatment Toleration: Well Treatment Completion Status: Treatment Completed without Adverse Event Treatment Notes 2nd Air break was not as scheduled due to timer issue. Physician HBO Attestation: I certify that I supervised this HBO  treatment in accordance with Medicare guidelines. A trained emergency response team is readily available per Yes hospital policies and procedures. Continue HBOT as ordered. Yes Electronic Signature(s) Signed: 07/31/2022 4:11:22 PM By: 08/02/2022 MD FACS Previous Signature: 07/31/2022 3:27:02 PM Version By: 08/02/2022 CHT EMT BS , , Entered By: Jimmy [name] on 07/31/2022 16:11:22 -------------------------------------------------------------------------------- HBO Safety Checklist Details Patient Name: Date of Service: 08/02/2022 Jimmy W. 07/31/2022 10:00 A M Medical Record Number: 08/02/2022 Patient Account Number: [id number] Date of Birth/Sex: Treating RN: [birth year] (56 y.o. 53 Primary Care Jimmy [name]: Jimmy [name] Other Clinician: Alinda [name] Referring Jimmy [name]: Treating Jimmy [name]/Extender: Jimmy [name] in Treatment: 14 HBO Safety Checklist Items Safety Checklist Consent Form Signed Patient voided / foley secured and emptied When did you last eato 0600 Last dose of injectable or oral agent n/a Ostomy pouch emptied and vented if applicable NA All implantable devices assessed, documented and approved NA Intravenous access site secured and place NA Valuables secured Linens and cotton and cotton/polyester blend (less than 51% polyester) Personal oil-based products / skin lotions / body lotions removed Wigs or hairpieces removed NA Smoking or tobacco materials removed NA Books / newspapers / magazines / loose paper removed Cologne, aftershave, perfume and deodorant removed Jewelry removed (may wrap wedding band) Make-up removed NA Hair care products removed Battery operated devices (external) removed Heating patches and chemical warmers removed Titanium eyewear removed NA Nail polish cured greater than 10 hours NA Casting material cured greater than 10 hours NA Hearing aids removed NA Loose dentures or  partials removed NA Prosthetics have been removed NA Patient demonstrates correct use of air break device (if applicable) Patient concerns have been addressed Patient grounding bracelet on and cord attached to chamber Specifics for Inpatients (complete in addition to above) Medication sheet sent with  patient NA Intravenous medications needed or due during therapy sent with patient NA Drainage tubes (e.g. nasogastric tube or chest tube secured and vented) NA Endotracheal or Tracheotomy tube secured NA Cuff deflated of air and inflated with saline NA Airway suctioned NA Notes Paper version used prior to treatment start. Electronic Signature(s) Signed: 07/31/2022 12:09:02 PM By: Jimmy [name] CHT EMT BS , , Entered By: Jimmy [name] on 07/31/2022 12:09:02
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
## 2022-08-01 LAB — CBC WITH DIFFERENTIAL/PLATELET
Absolute Monocytes: 407 cells/uL (ref 200–950)
Basophils Absolute: 59 cells/uL (ref 0–200)
Basophils Relative: 0.8 %
Eosinophils Absolute: 266 cells/uL (ref 15–500)
Eosinophils Relative: 3.6 %
HCT: 48.2 % (ref 38.5–50.0)
Hemoglobin: 16.2 g/dL (ref 13.2–17.1)
Lymphs Abs: 1961 cells/uL (ref 850–3900)
MCH: 29.9 pg (ref 27.0–33.0)
MCHC: 33.6 g/dL (ref 32.0–36.0)
MCV: 88.9 fL (ref 80.0–100.0)
MPV: 10.6 fL (ref 7.5–12.5)
Monocytes Relative: 5.5 %
Neutro Abs: 4706 cells/uL (ref 1500–7800)
Neutrophils Relative %: 63.6 %
Platelets: 388 10*3/uL (ref 140–400)
RBC: 5.42 10*6/uL (ref 4.20–5.80)
RDW: 20.1 % — ABNORMAL HIGH (ref 11.0–15.0)
Total Lymphocyte: 26.5 %
WBC: 7.4 10*3/uL (ref 3.8–10.8)

## 2022-08-01 LAB — COMPLETE METABOLIC PANEL WITH GFR
AG Ratio: 1.6 (calc) (ref 1.0–2.5)
ALT: 25 U/L (ref 9–46)
AST: 19 U/L (ref 10–35)
Albumin: 4.6 g/dL (ref 3.6–5.1)
Alkaline phosphatase (APISO): 55 U/L (ref 35–144)
BUN: 13 mg/dL (ref 7–25)
CO2: 27 mmol/L (ref 20–32)
Calcium: 10.1 mg/dL (ref 8.6–10.3)
Chloride: 101 mmol/L (ref 98–110)
Creat: 0.91 mg/dL (ref 0.70–1.30)
Globulin: 2.8 g/dL (calc) (ref 1.9–3.7)
Glucose, Bld: 91 mg/dL (ref 65–99)
Potassium: 4.9 mmol/L (ref 3.5–5.3)
Sodium: 138 mmol/L (ref 135–146)
Total Bilirubin: 0.4 mg/dL (ref 0.2–1.2)
Total Protein: 7.4 g/dL (ref 6.1–8.1)
eGFR: 100 mL/min/{1.73_m2} (ref >=60)

## 2022-08-01 LAB — HIV-1 RNA QUANT-NO REFLEX-BLD
HIV 1 RNA Quant: NOT DETECTED Copies/mL
HIV-1 RNA Quant, Log: NOT DETECTED Log cps/mL

## 2022-08-01 LAB — C-REACTIVE PROTEIN: CRP: 3.3 mg/L (ref <8.0)

## 2022-08-01 LAB — SEDIMENTATION RATE: Sed Rate: 11 mm/h (ref 0–20)

## 2022-08-03 ENCOUNTER — Encounter (HOSPITAL_BASED_OUTPATIENT_CLINIC_OR_DEPARTMENT_OTHER): Payer: BC Managed Care – PPO | Admitting: General Surgery

## 2022-08-03 ENCOUNTER — Encounter (HOSPITAL_BASED_OUTPATIENT_CLINIC_OR_DEPARTMENT_OTHER): Payer: BC Managed Care – PPO | Admitting: Internal Medicine

## 2022-08-03 DIAGNOSIS — Z86718 Personal history of other venous thrombosis and embolism: Secondary | ICD-10-CM | POA: Diagnosis not present

## 2022-08-03 DIAGNOSIS — Z21 Asymptomatic human immunodeficiency virus [HIV] infection status: Secondary | ICD-10-CM

## 2022-08-03 DIAGNOSIS — M269 Dentofacial anomaly, unspecified: Secondary | ICD-10-CM

## 2022-08-03 DIAGNOSIS — M8668 Other chronic osteomyelitis, other site: Secondary | ICD-10-CM | POA: Diagnosis not present

## 2022-08-03 DIAGNOSIS — E785 Hyperlipidemia, unspecified: Secondary | ICD-10-CM | POA: Diagnosis not present

## 2022-08-03 DIAGNOSIS — I10 Essential (primary) hypertension: Secondary | ICD-10-CM | POA: Diagnosis not present

## 2022-08-03 DIAGNOSIS — Z87828 Personal history of other (healed) physical injury and trauma: Secondary | ICD-10-CM

## 2022-08-03 NOTE — Progress Notes (Signed)
KUTTER, SCHNEPF ([id number]) Visit Report for 08/03/2022 Arrival Information Details Patient Name: Date of Service: [name] 08/03/2022 10:00 [place] Record Number: [id number] Patient Account Number: [id number] Date of Birth/Sex: Treating RN: 1966-06-30 (56 y.o. [name], [name] Primary Care Judea Fennimore: Greig Right Other Clinician: Valeria Batman Referring Olamide Lahaie: Treating Kayleann Mccaffery/Extender: Arnoldo Hooker in Treatment: 14 Visit Information History Since Last Visit All ordered tests and consults were completed: Yes Patient Arrived: Ambulatory Added or deleted any medications: No Arrival Time: 09:37 Any new allergies or adverse reactions: No Accompanied By: Wife Had a fall or experienced change in No Transfer Assistance: None activities of daily living that may affect Patient Identification Verified: Yes risk of falls: Secondary Verification Process Completed: Yes Signs or symptoms of abuse/neglect since last visito No Patient Requires Transmission-Based Precautions: No Hospitalized since last visit: No Patient Has Alerts: No Implantable device outside of the clinic excluding No cellular tissue based products placed in the center since last visit: Pain Present Now: No Electronic Signature(s) Signed: 08/03/2022 3:16:22 PM By: Valeria Batman EMT Entered By: Valeria Batman on 08/03/2022 15:16:22 -------------------------------------------------------------------------------- Encounter Discharge Information Details Patient Name: Date of Service: [name], [name] 08/03/2022 10:00 A M Medical Record Number: [id number] Patient Account Number: [id number] Date of Birth/Sex: Treating RN: [birth year] (56 y.o. [name], [name] Primary Care Juandedios Dudash: Greig Right Other Clinician: Valeria Batman Referring Berton Butrick: Treating Obinna Ehresman/Extender: Arnoldo Hooker in Treatment: 14 Encounter Discharge Information  Items Discharge Condition: Stable Ambulatory Status: Ambulatory Discharge Destination: Home Transportation: Private Auto Accompanied By: Wife Schedule Follow-up Appointment: Yes Clinical Summary of Care: Electronic Signature(s) Signed: 08/03/2022 3:21:37 PM By: Valeria Batman EMT Entered By: Valeria Batman on 08/03/2022 15:21:36 -------------------------------------------------------------------------------- Vitals Details Patient Name: Date of Service: [name] 08/03/2022 10:00 A M Medical Record Number: [id number] Patient Account Number: [id number] Date of Birth/Sex: Treating RN: Nov 14, 1966 (56 y.o. [name], [name] Primary Care Titus Drone: Greig Right Other Clinician: Valeria Batman Referring Tranice Laduke: Treating Anitra Doxtater/Extender: Arnoldo Hooker in Treatment: 14 Vital Signs Time Taken: 10:03 Temperature (F): 98.2 Height (in): 71 Pulse (bpm): 89 Weight (lbs): 190 Respiratory Rate (breaths/min): 16 Body Mass Index (BMI): 26.5 Blood Pressure (mmHg): 125/72 Reference Range: 80 - 120 mg / dl Electronic Signature(s) Signed: 08/03/2022 3:16:47 PM By: Valeria Batman EMT Entered By: Valeria Batman on 08/03/2022 15:16:47

## 2022-08-03 NOTE — Progress Notes (Signed)
Jimmy [name], Jimmy [name] ([id number]) Visit Report for 08/03/2022 Problem List Details Patient Name: Date of Service: [name] 08/03/2022 10:00 [place] Record Number: [id number] Patient Account Number: [id number] Date of Birth/Sex: Treating RN: 08/08/1966 (56 y.o. 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 08/03/2022 Medical Record Number: [id number] Patient Account Number: [id number] Date of Birth/Sex: Treating RN: 01/05/1966 (56 y.o. 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, unspecified Z21 Asymptomatic human immunodeficiency virus [HIV] infection status K26.0 Acute duodenal ulcer with hemorrhage Facility  Procedures CPT4 Code: [id number] Description: G0277-(Facility Use Only) HBOT full body chamber, 42min , ICD-10 Diagnosis Description M86.68 Other chronic osteomyelitis, other site Z87.828 Personal history of other (healed) physical injury and trauma 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,  unspecified Z21 Asymptomatic human immunodeficiency virus [HIV] infection status Modifier: Quantity: 4 Physician Procedures : CPT4 Code Description Modifier [id number] - WC PHYS HYPERBARIC OXYGEN THERAPY ICD-10 Diagnosis Description M86.68 Other chronic osteomyelitis, other site Z87.828 Personal history of other (healed) physical injury and trauma M26.9 Dentofacial  anomaly, unspecified Z21 Asymptomatic human immunodeficiency virus [HIV] infection status Quantity: 1 Electronic Signature(s) Signed: 08/03/2022 3:20:59 PM By: Valeria Batman EMT Signed: 08/03/2022 3:53:44 PM By: Kalman Shan DO Entered By: Valeria Batman on 08/03/2022 15:20:59

## 2022-08-03 NOTE — Progress Notes (Addendum)
Jimmy, [name] ([id number]) Visit Report for 08/03/2022 HBO Details Patient Name: Date of Service: DA Jimmy [address] 08/03/2022 10:00 [place] Record Number: [id number] Patient Account Number: [id number] Date of Birth/Sex: Treating RN: October 01, 1966 (56 y.o. 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 HBO Treatment Details Treatment Number: 54 Patient Type: Outpatient Chamber Type: Monoplace Chamber Serial #: [id number] Treatment Protocol: 2.0 ATA with 90 minutes oxygen, with two 5 minute air breaks Treatment Details Compression Rate Down: 2.0 psi / minute De-Compression Rate Up: 2.0 psi / minute A breaks and breathing ir Compress Tx Pressure periods Decompress Decompress Begins Reached (leave unused spaces Begins Ends blank) Chamber Pressure (ATA 1 2 2 2 2 2  --2 1 ) Clock Time (24 hr) 10:13 10:22 10:52 10:57 11:27 11:32 - - 12:02 12:10 Treatment Length: 117 (minutes) Treatment Segments: 4 Vital Signs Capillary Blood Glucose Reference Range: 80 - 120 mg / dl HBO Diabetic Blood Glucose Intervention Range: <131 mg/dl or >249 mg/dl Time Vitals Blood Respiratory Capillary Blood Glucose Pulse Action Type: Pulse: Temperature: Taken: Pressure: Rate: Glucose (mg/dl): Meter #: Oximetry (%) Taken: Pre 10:03 125/72 89 16 98.2 Post 12:13 124/86 79 16 97.5 Treatment Response Treatment Toleration: Well Treatment Completion Status: Treatment Completed without Adverse Event Physician HBO Attestation: I certify that I supervised this HBO treatment in accordance with Medicare guidelines. A trained emergency response  team is readily available per Yes hospital policies and procedures. Continue HBOT as ordered. Yes Electronic Signature(s) Signed: 08/03/2022 3:53:44 PM By: Kalman Shan DO Previous Signature: 08/03/2022 3:20:31 PM Version By: Valeria Batman EMT Entered By: Kalman Shan on 08/03/2022 15:34:31 -------------------------------------------------------------------------------- HBO Safety Checklist Details Patient Name: Date of Service: Jimmy [name] [name] 08/03/2022 10:00 [place] Record Number: [id number] Patient Account Number: [id number] Date of Birth/Sex: Treating RN: 1966-02-24 (56 y.o. Jimmy [name], [name] Primary Care Jimmy [name]: Greig Right Other Clinician: Valeria Batman Referring Jimmy [name]: Treating Larin Depaoli/Extender: Arnoldo Hooker in Treatment: 14 HBO Safety Checklist Items Safety Checklist Consent Form Signed Patient voided / foley secured and emptied When did you last eato 0600 Last dose of injectable or oral agent NA Ostomy pouch emptied and vented if applicable NA All implantable devices assessed, documented and approved NA Intravenous access site secured and place NA Valuables secured Linens and cotton and cotton/polyester blend (less than 51% polyester) Personal oil-based products / skin lotions / body lotions removed Wigs or hairpieces removed NA Smoking or tobacco materials removed Books / newspapers / magazines / loose paper removed Cologne, aftershave, perfume and deodorant removed Jewelry removed (may wrap wedding band) NA Make-up removed NA Hair care products removed Battery operated devices (external) removed Heating patches and chemical warmers removed Titanium eyewear removed NA Nail polish cured greater than 10 hours NA Casting material cured greater than 10 hours NA Hearing aids removed NA Loose dentures or partials removed NA Prosthetics have been removed NA Patient demonstrates correct use of air break  device (if applicable) Patient concerns have been addressed Patient grounding bracelet on and cord attached to chamber Specifics for Inpatients (complete in addition to above) Medication sheet sent with patient NA Intravenous medications needed or due during therapy sent with patient NA Drainage tubes (e.g. nasogastric tube  or chest tube secured and vented) NA Endotracheal or Tracheotomy tube secured NA Cuff deflated of air and inflated with saline NA Airway suctioned NA Notes The safety checklist was done before the treatment was started. Electronic Signature(s) Signed: 08/03/2022 3:17:36 PM By: Valeria Batman EMT Entered By: Valeria Batman on 08/03/2022 15:17:36

## 2022-08-04 ENCOUNTER — Encounter (HOSPITAL_BASED_OUTPATIENT_CLINIC_OR_DEPARTMENT_OTHER): Payer: BC Managed Care – PPO | Admitting: Internal Medicine

## 2022-08-04 DIAGNOSIS — Z87828 Personal history of other (healed) physical injury and trauma: Secondary | ICD-10-CM

## 2022-08-04 DIAGNOSIS — E785 Hyperlipidemia, unspecified: Secondary | ICD-10-CM | POA: Diagnosis not present

## 2022-08-04 DIAGNOSIS — Z86718 Personal history of other venous thrombosis and embolism: Secondary | ICD-10-CM | POA: Diagnosis not present

## 2022-08-04 DIAGNOSIS — M269 Dentofacial anomaly, unspecified: Secondary | ICD-10-CM | POA: Diagnosis not present

## 2022-08-04 DIAGNOSIS — M8668 Other chronic osteomyelitis, other site: Secondary | ICD-10-CM | POA: Diagnosis not present

## 2022-08-04 DIAGNOSIS — I10 Essential (primary) hypertension: Secondary | ICD-10-CM | POA: Diagnosis not present

## 2022-08-04 NOTE — Progress Notes (Addendum)
DRAY, DENTE ([id number]) Visit Report for 08/04/2022 HBO Details Patient Name: Date of Service: [name] 08/04/2022 10:00 [place] Record Number: [id number] Patient Account Number: [id number] Date of Birth/Sex: Treating RN: [birth year] (56 y.o. 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 HBO Treatment Details Treatment Number: 55 Patient Type: Outpatient Chamber Type: Monoplace Chamber Serial #: [id number] Treatment Protocol: 2.0 ATA with 90 minutes oxygen, with two 5 minute air breaks Treatment Details Compression Rate Down: 2.0 psi / minute De-Compression Rate Up: 2.0 psi / minute A breaks and breathing ir Compress Tx Pressure periods Decompress Decompress Begins Reached (leave unused spaces Begins Ends blank) Chamber Pressure (ATA 1 2 2 2 2 2  --2 1 ) Clock Time (24 hr) 10:21 10:29 10:59 11:04 11:34 11:39 - - 12:09 12:17 Treatment Length: 116 (minutes) Treatment Segments: 4 Vital Signs Capillary Blood Glucose Reference Range: 80 - 120 mg / dl HBO Diabetic Blood Glucose Intervention Range: <131 mg/dl or >249 mg/dl Type: Time Vitals Blood Respiratory Capillary Blood Glucose Pulse Action Pulse: Temperature: Taken: Pressure: Rate: Glucose (mg/dl): Meter #: Oximetry (%) Taken: Pre 10:19 102/77 89 20 97.7 none per protocol Post 12:19 120/79 77 18 97.9 none per protocol Treatment Response Treatment Toleration: Well Treatment Completion Status: Treatment Completed without Adverse Event Physician HBO Attestation: I certify that I supervised this HBO treatment in accordance with  Medicare guidelines. A trained emergency response team is readily available per Yes hospital policies and procedures. Continue HBOT as ordered. Yes Electronic Signature(s) Signed: 08/04/2022 4:16:01 PM By: Kalman Shan DO Previous Signature: 08/04/2022 2:15:35 PM Version By: Donavan Burnet CHT EMT BS , , Entered By: Kalman Shan on 08/04/2022 15:36:52 -------------------------------------------------------------------------------- HBO Safety Checklist Details Patient Name: Date of Service: [name] 08/04/2022 10:00 [place] Record Number: [id number] Patient Account Number: [id number] Date of Birth/Sex: Treating RN: [birth year] (56 y.o. Janyth Contes Primary Care Shaynna Husby: Greig Right Other Clinician: Donavan Burnet Referring Ozzie Knobel: Treating [place]/Extender: Arnoldo Hooker in Treatment: 14 HBO Safety Checklist Items Safety Checklist Consent Form Signed Patient voided / foley secured and emptied When did you last eato 0700 Last dose of injectable or oral agent n/a Ostomy pouch emptied and vented if applicable NA All implantable devices assessed, documented and approved NA Intravenous access site secured and place NA Valuables secured Linens and cotton and cotton/polyester blend (less than 51% polyester) Personal oil-based products / skin lotions / body lotions removed Wigs or hairpieces removed NA Smoking or tobacco materials removed NA Books / newspapers / magazines / loose paper removed Cologne, aftershave, perfume and deodorant removed Jewelry removed (may wrap wedding band) Make-up removed NA Hair care products removed Battery operated devices (external) removed Heating patches and chemical warmers removed Titanium eyewear removed NA Nail polish cured greater than 10 hours NA Casting material cured greater than 10 hours NA Hearing aids removed NA Loose dentures or partials  removed NA Prosthetics have been removed NA Patient demonstrates correct use of air break device (if applicable) Patient concerns have been addressed Patient grounding bracelet on and cord attached to chamber Specifics for Inpatients (complete in addition to above) Medication sheet sent with patient NA Intravenous medications needed or due during  therapy sent with patient NA Drainage tubes (e.g. nasogastric tube or chest tube secured and vented) NA Endotracheal or Tracheotomy tube secured NA Cuff deflated of air and inflated with saline NA Airway suctioned NA Notes Paper version used prior to treatment start. Electronic Signature(s) Signed: 08/04/2022 2:13:12 PM By: [name] CHT EMT BS , , Entered By: [name] on 08/04/2022 14:13:12

## 2022-08-04 NOTE — Progress Notes (Addendum)
RIORDAN, WALLE ([id number]) Visit Report for 08/04/2022 Arrival Information Details Patient Name: Date of Service: [name] 08/04/2022 10:00 [place] Medical Record Number: [id number] Patient Account Number: [id number] Date of Birth/Sex: Treating RN: 1966/05/08 (56 y.o. Janyth Contes Primary Care Abaigeal Moomaw: Greig Right Other Clinician: Donavan Burnet Referring Valetta Mulroy: Treating Liesa Tsan/Extender: Arnoldo Hooker in Treatment: 5 Visit Information History Since Last Visit All ordered tests and consults were completed: Yes Patient Arrived: Ambulatory Added or deleted any medications: No Arrival Time: 09:51 Any new allergies or adverse reactions: No Accompanied By: spouse Had a fall or experienced change in No Transfer Assistance: None activities of daily living that may affect Patient Identification Verified: Yes risk of falls: Secondary Verification Process Completed: Yes Signs or symptoms of abuse/neglect since last visito No Patient Requires Transmission-Based Precautions: No Hospitalized since last visit: No Patient Has Alerts: No Implantable device outside of the clinic excluding No cellular tissue based products placed in the center since last visit: Pain Present Now: No Electronic Signature(s) Signed: 08/04/2022 2:10:36 PM By: Donavan Burnet CHT EMT BS , , Entered By: Donavan Burnet on 08/04/2022 14:10:36 -------------------------------------------------------------------------------- Encounter Discharge Information Details Patient Name: Date of Service: [name], [name] 08/04/2022 10:00 A M Medical Record Number: [id number] Patient Account Number: [id number] Date of Birth/Sex: Treating RN: [birth year] (56 y.o. Janyth Contes Primary Care Deslyn Cavenaugh: Greig Right Other Clinician: Donavan Burnet Referring Artelia Game: Treating Tyresse Jayson/Extender: Arnoldo Hooker in Treatment: 14 Encounter  Discharge Information Items Discharge Condition: Stable Ambulatory Status: Ambulatory Discharge Destination: Home Transportation: Private Auto Accompanied By: spouse Schedule Follow-up Appointment: No Clinical Summary of Care: Electronic Signature(s) Signed: 08/04/2022 2:16:34 PM By: Donavan Burnet CHT EMT BS , , Entered By: Donavan Burnet on 08/04/2022 14:16:34 -------------------------------------------------------------------------------- Vitals Details Patient Name: Date of Service: [name] 08/04/2022 10:00 A M Medical Record Number: [id number] Patient Account Number: [id number] Date of Birth/Sex: Treating RN: March 06, 1966 (56 y.o. Janyth Contes Primary Care Nakiesha Rumsey: Greig Right Other Clinician: Donavan Burnet Referring [place] Sarinana: Treating Jordanne Elsbury/Extender: Arnoldo Hooker in Treatment: 14 Vital Signs Time Taken: 10:19 Temperature (F): 97.7 Height (in): 71 Pulse (bpm): 89 Weight (lbs): 190 Respiratory Rate (breaths/min): 20 Body Mass Index (BMI): 26.5 Blood Pressure (mmHg): 102/77 Reference Range: 80 - 120 mg / dl Electronic Signature(s) Signed: 08/04/2022 2:11:05 PM By: Donavan Burnet CHT EMT BS , , Entered By: Donavan Burnet on 08/04/2022 14:11:05

## 2022-08-04 NOTE — Progress Notes (Signed)
Jimmy [name], Jimmy [name] ([id number]) Visit Report for 08/04/2022 SuperBill Details Patient Name: Date of Service: [name] 08/04/2022 Medical Record Number: [id number] Patient Account Number: [id number] Date of Birth/Sex: Treating RN: 1966/10/09 (56 y.o. 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, unspecified Z21 Asymptomatic human immunodeficiency virus [HIV] infection status K26.0 Acute duodenal ulcer with hemorrhage Facility Procedures CPT4 Code Description Modifier Quantity [id number] G0277-(Facility Use Only) HBOT full body chamber, 39min , 4 ICD-10 Diagnosis Description M86.68 Other chronic osteomyelitis, other site Z87.828 Personal history of other (healed) physical injury and trauma 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

## 2022-08-05 ENCOUNTER — Encounter (HOSPITAL_BASED_OUTPATIENT_CLINIC_OR_DEPARTMENT_OTHER): Payer: BC Managed Care – PPO | Admitting: General Surgery

## 2022-08-06 ENCOUNTER — Encounter (HOSPITAL_BASED_OUTPATIENT_CLINIC_OR_DEPARTMENT_OTHER): Payer: BC Managed Care – PPO | Admitting: General Surgery

## 2022-08-06 DIAGNOSIS — M8668 Other chronic osteomyelitis, other site: Secondary | ICD-10-CM | POA: Diagnosis not present

## 2022-08-06 DIAGNOSIS — I10 Essential (primary) hypertension: Secondary | ICD-10-CM | POA: Diagnosis not present

## 2022-08-06 DIAGNOSIS — Z86718 Personal history of other venous thrombosis and embolism: Secondary | ICD-10-CM | POA: Diagnosis not present

## 2022-08-06 DIAGNOSIS — M269 Dentofacial anomaly, unspecified: Secondary | ICD-10-CM | POA: Diagnosis not present

## 2022-08-06 DIAGNOSIS — E785 Hyperlipidemia, unspecified: Secondary | ICD-10-CM | POA: Diagnosis not present

## 2022-08-06 NOTE — Progress Notes (Signed)
Jimmy [name], Jimmy [name] ([id number]) Visit Report for 08/06/2022 Problem List Details Patient Name: Date of Service: Jimmy [name]. 08/06/2022 10:00 [place] Record Number: [id number] Patient Account Number: [id number] Date of Birth/Sex: Treating RN: 05-16-1966 (56 y.o. 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 08/06/2022 Medical Record Number: [id number] Patient Account Number: [id number] Date of Birth/Sex: Treating RN: 1966/09/13 (56 y.o. 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, unspecified Z21 Asymptomatic human immunodeficiency virus [HIV] infection status K26.0 Acute duodenal ulcer with hemorrhage Facility  Procedures CPT4 Code: [id number] Description: G0277-(Facility Use Only) HBOT full body chamber, 26min , ICD-10 Diagnosis Description M86.68 Other chronic osteomyelitis, other site Z87.828 Personal history of other (healed) physical injury and trauma 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,  unspecified Z21 Asymptomatic human immunodeficiency virus [HIV] infection status Modifier: Quantity: 4 Physician Procedures : CPT4 Code Description Modifier [id number] 78242 - WC PHYS HYPERBARIC OXYGEN THERAPY ICD-10 Diagnosis Description M86.68 Other chronic osteomyelitis, other site Z87.828 Personal history of other (healed) physical injury and trauma M26.9 Dentofacial  anomaly, unspecified Z21 Asymptomatic human immunodeficiency virus [HIV] infection status Quantity: 1 Electronic Signature(s) Signed: 08/06/2022 1:39:16 PM By: Valeria Batman EMT Signed: 08/06/2022 3:00:56 PM By: Fredirick Maudlin MD FACS Entered By: Valeria Batman on 08/06/2022 13:39:15

## 2022-08-06 NOTE — Progress Notes (Addendum)
Jimmy [name] ([id number]) Visit Report for 08/06/2022 Arrival Information Details Patient Name: Date of Service: [name] 08/06/2022 10:00 [place] Record Number: [id number] Patient Account Number: [id number] Date of Birth/Sex: Treating RN: 06/12/1966 (56 y.o. Jimmy [name], [place] Primary Care Maiyah Goyne: Greig Right Other Clinician: Valeria Batman Referring Akari Defelice: Treating Emmalee Solivan/Extender: Tonette Lederer in Treatment: 14 Visit Information History Since Last Visit All ordered tests and consults were completed: Yes Patient Arrived: Ambulatory Added or deleted any medications: No Arrival Time: 09:46 Any new allergies or adverse reactions: No Accompanied By: Wife Had a fall or experienced change in No Transfer Assistance: None activities of daily living that may affect Patient Identification Verified: Yes risk of falls: Secondary Verification Process Completed: Yes Signs or symptoms of abuse/neglect since last visito No Patient Requires Transmission-Based Precautions: No Hospitalized since last visit: No Patient Has Alerts: No Implantable device outside of the clinic excluding No cellular tissue based products placed in the center since last visit: Pain Present Now: No Electronic Signature(s) Signed: 08/06/2022 1:36:29 PM By: Valeria Batman EMT Entered By: Valeria Batman on 08/06/2022 13:36:29 -------------------------------------------------------------------------------- Encounter Discharge Information Details Patient Name: Date of Service: [name] 08/06/2022 10:00 A M Medical Record Number: [id number] Patient Account Number: [id number] Date of Birth/Sex: Treating RN: 22-Apr-1966 (56 y.o. [name] Primary Care Bernd Crom: Greig Right Other Clinician: Valeria Batman Referring Yahir Tavano: Treating Jamerson Vonbargen/Extender: Tonette Lederer in Treatment: 14 Encounter Discharge Information  Items Discharge Condition: Stable Ambulatory Status: Ambulatory Discharge Destination: Home Transportation: Private Auto Accompanied By: Wife Schedule Follow-up Appointment: Yes Clinical Summary of Care: Electronic Signature(s) Signed: 08/06/2022 1:39:44 PM By: Valeria Batman EMT Entered By: Valeria Batman on 08/06/2022 13:39:43 -------------------------------------------------------------------------------- Vitals Details Patient Name: Date of Service: [name] 08/06/2022 10:00 A M Medical Record Number: [id number] Patient Account Number: [id number] Date of Birth/Sex: Treating RN: August 06, 1966 (56 y.o. [name] Primary Care Taija Mathias: Greig Right Other Clinician: Valeria Batman Referring Dezyre Hoefer: Treating Ariyan Sinnett/Extender: Tonette Lederer in Treatment: 14 Vital Signs Time Taken: 10:05 Temperature (F): 97.9 Height (in): 71 Pulse (bpm): 81 Weight (lbs): 190 Respiratory Rate (breaths/min): 16 Body Mass Index (BMI): 26.5 Blood Pressure (mmHg): 118/72 Reference Range: 80 - 120 mg / dl Electronic Signature(s) Signed: 08/06/2022 1:36:54 PM By: Valeria Batman EMT Entered By: Valeria Batman on 08/06/2022 13:36:53

## 2022-08-06 NOTE — Progress Notes (Addendum)
Jimmy [name], Jimmy [name] ([id number]) Visit Report for 08/06/2022 HBO Details Patient Name: Date of Service: Jimmy [name]. 08/06/2022 10:00 A M Medical Record Number: [id number] Patient Account Number: [id number] Date of Birth/Sex: Treating RN: Nov 23, 1965 (56 y.o. 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 HBO Treatment Details Treatment Number: 56 Patient Type: Outpatient Chamber Type: Monoplace Chamber Serial #: [id number] Treatment Protocol: 2.0 ATA with 90 minutes oxygen, with two 5 minute air breaks Treatment Details Compression Rate Down: 2.0 psi / minute De-Compression Rate Up: 2.0 psi / minute A breaks and breathing ir Compress Tx Pressure periods Decompress Decompress Begins Reached (leave unused spaces Begins Ends blank) Chamber Pressure (ATA 1 2 2 2 2 2  --2 1 ) Clock Time (24 hr) 10:08 10:16 10:46 10:52 11:22 11:27 - - 11:57 12:03 Treatment Length: 115 (minutes) Treatment Segments: 4 Vital Signs Capillary Blood Glucose Reference Range: 80 - 120 mg / dl HBO Diabetic Blood Glucose Intervention Range: <131 mg/dl or mg/dl Time Vitals Blood Respiratory Capillary Blood Glucose Pulse Action Type: Pulse: Temperature: Taken: Pressure: Rate: Glucose (mg/dl): Meter #: Oximetry (%) Taken: Pre 10:05 118/72 81 16 97.9 Post 12:05 123/83 77 18 98.1 Treatment Response Treatment Toleration: Well Treatment Completion Status: Treatment Completed without Adverse Event Physician HBO Attestation: I certify that I supervised this HBO treatment in accordance with Medicare guidelines. A trained emergency response  team is readily available per Yes hospital policies and procedures. Continue HBOT as ordered. Yes Electronic Signature(s) Signed: 08/06/2022 3:02:43 PM By: 08/08/2022 MD FACS Previous Signature: 08/06/2022 1:38:52 PM Version By: 08/08/2022 EMT Entered By: Jimmy [name] on 08/06/2022 15:02:43 -------------------------------------------------------------------------------- HBO Safety Checklist Details Patient Name: Date of Service: 08/08/2022 Jimmy W. 08/06/2022 10:00 A M Medical Record Number: 08/08/2022 Patient Account Number: [id number] Date of Birth/Sex: Treating RN: 1966/05/23 (56 y.o. 53 Primary Care Jimmy [name]: Jimmy [name] Other Clinician: Alinda [name] Referring Jimmy [name]: Treating Jimmy [name]/Extender: Jimmy [name] in Treatment: 14 HBO Safety Checklist Items Safety Checklist Consent Form Signed Patient voided / foley secured and emptied When did you last eato 0700 Last dose of injectable or oral agent NA Ostomy pouch emptied and vented if applicable NA All implantable devices assessed, documented and approved NA Intravenous access site secured and place NA Valuables secured Linens and cotton and cotton/polyester blend (less than 51% polyester) Personal oil-based products / skin lotions / body lotions removed Wigs or hairpieces removed NA Smoking or tobacco materials removed Books / newspapers / magazines / loose paper removed Cologne, aftershave, perfume and deodorant removed Jewelry removed (may wrap wedding band) NA Make-up removed NA Hair care products removed Battery operated devices (external) removed Heating patches and chemical warmers removed Titanium eyewear removed NA Nail polish cured greater than 10 hours NA Casting material cured greater than 10 hours NA Hearing aids removed NA Loose dentures or partials removed NA Prosthetics have been removed NA Patient demonstrates correct use of air break  device (if applicable) Patient concerns have been addressed Patient grounding bracelet on and cord attached to chamber Specifics for Inpatients (complete in addition to above) Medication sheet sent with patient NA Intravenous medications needed or due during therapy sent with patient NA Drainage tubes (e.g. nasogastric  tube or chest tube secured and vented) NA Endotracheal or Tracheotomy tube secured NA Cuff deflated of air and inflated with saline NA Airway suctioned NA Notes The safety checklist was done before the treatment was started. Electronic Signature(s) Signed: 08/06/2022 1:37:43 PM By: Jimmy [name] EMT Entered By: Jimmy [name] on 08/06/2022 13:37:43

## 2022-08-07 ENCOUNTER — Encounter (HOSPITAL_BASED_OUTPATIENT_CLINIC_OR_DEPARTMENT_OTHER): Payer: BC Managed Care – PPO | Admitting: Internal Medicine

## 2022-08-07 DIAGNOSIS — M269 Dentofacial anomaly, unspecified: Secondary | ICD-10-CM

## 2022-08-07 DIAGNOSIS — Z87828 Personal history of other (healed) physical injury and trauma: Secondary | ICD-10-CM | POA: Diagnosis not present

## 2022-08-07 DIAGNOSIS — I10 Essential (primary) hypertension: Secondary | ICD-10-CM | POA: Diagnosis not present

## 2022-08-07 DIAGNOSIS — E785 Hyperlipidemia, unspecified: Secondary | ICD-10-CM | POA: Diagnosis not present

## 2022-08-07 DIAGNOSIS — M8668 Other chronic osteomyelitis, other site: Secondary | ICD-10-CM

## 2022-08-07 DIAGNOSIS — Z86718 Personal history of other venous thrombosis and embolism: Secondary | ICD-10-CM | POA: Diagnosis not present

## 2022-08-07 NOTE — Progress Notes (Addendum)
[name], [name] ([id number]) Visit Report for 08/07/2022 Arrival Information Details Patient Name: Date of Service: Jimmy [name]. 08/07/2022 10:00 [place] Record Number: [id number] Patient Account Number: [id number] Date of Birth/Sex: Treating RN: 1966-10-01 (56 y.o. Janyth Contes Primary Care Yvaine Jankowiak: Greig Right Other Clinician: Donavan Burnet Referring Maygan Koeller: Treating Thadeus Gandolfi/Extender: Arnoldo Hooker in Treatment: 15 Visit Information History Since Last Visit All ordered tests and consults were completed: Yes Patient Arrived: Ambulatory Added or deleted any medications: No Arrival Time: 09:52 Any new allergies or adverse reactions: No Accompanied By: spouse Had a fall or experienced change in No Transfer Assistance: None activities of daily living that may affect Patient Identification Verified: Yes risk of falls: Secondary Verification Process Completed: Yes Signs or symptoms of abuse/neglect since last visito No Patient Requires Transmission-Based Precautions: No Hospitalized since last visit: No Patient Has Alerts: No Implantable device outside of the clinic excluding No cellular tissue based products placed in the center since last visit: Pain Present Now: No Electronic Signature(s) Signed: 08/07/2022 2:02:08 PM By: Donavan Burnet CHT EMT BS , , Previous Signature: 08/07/2022 10:56:43 AM Version By: Donavan Burnet CHT EMT BS , , Entered By: Donavan Burnet on 08/07/2022 14:02:07 -------------------------------------------------------------------------------- Encounter Discharge Information Details Patient Name: Date of Service: [name], [name] 08/07/2022 10:00 A M Medical Record Number: [id number] Patient Account Number: [id number] Date of Birth/Sex: Treating RN: 07/13/1966 (56 y.o. Janyth Contes Primary Care Josimar Corning: Greig Right Other Clinician: Donavan Burnet Referring Medina Degraffenreid: Treating  Adom Schoeneck/Extender: Arnoldo Hooker in Treatment: 15 Encounter Discharge Information Items Discharge Condition: Stable Ambulatory Status: Ambulatory Discharge Destination: Home Transportation: Private Auto Accompanied By: spouse Schedule Follow-up Appointment: No Clinical Summary of Care: Electronic Signature(s) Signed: 08/07/2022 2:01:36 PM By: Donavan Burnet CHT EMT BS , , Entered By: Donavan Burnet on 08/07/2022 14:01:36 -------------------------------------------------------------------------------- Vitals Details Patient Name: Date of Service: [name] 08/07/2022 10:00 A M Medical Record Number: [id number] Patient Account Number: [id number] Date of Birth/Sex: Treating RN: 1966/02/22 (56 y.o. Janyth Contes Primary Care Hamlet Lasecki: Greig Right Other Clinician: Donavan Burnet Referring Zamirah Denny: Treating Kyliee Ortego/Extender: Arnoldo Hooker in Treatment: 15 Vital Signs Time Taken: 10:26 Temperature (F): 97.2 Height (in): 71 Pulse (bpm): 81 Weight (lbs): 190 Respiratory Rate (breaths/min): 22 Body Mass Index (BMI): 26.5 Blood Pressure (mmHg): 130/81 Reference Range: 80 - 120 mg / dl Electronic Signature(s) Signed: 08/07/2022 10:57:08 AM By: Donavan Burnet CHT EMT BS , , Entered By: Donavan Burnet on 08/07/2022 10:57:08

## 2022-08-07 NOTE — Progress Notes (Addendum)
Jimmy [name], Jimmy [name] ([id number]) Visit Report for 08/07/2022 HBO Details Patient Name: Date of Service: [name] 08/07/2022 10:00 A M Medical Record Number: [id number] Patient Account Number: [id number] Date of Birth/Sex: Treating RN: 28-Dec-1965 (56 y.o. 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 HBO Treatment End 08/07/2022 Date: HBO Discharge Treatment Series Complete; No Change / No Outcome: Improvement HBO Treatment Details Treatment Number: 57 Patient Type: Outpatient Chamber Type: Monoplace Chamber Serial #: [id number] Treatment Protocol: 2.0 ATA with 90 minutes oxygen, with two 5 minute air breaks Treatment Details Compression Rate Down: 2.0 psi / minute De-Compression Rate Up: 2.0 psi / minute A breaks and breathing ir Compress Tx Pressure periods Decompress Decompress Begins Reached (leave unused spaces Begins Ends blank) Chamber Pressure (ATA 1 2 2 2 2 2  --2 1 ) Clock Time (24 hr) 10:28 10:36 11:06 11:11 11:41 11:46 - - 12:16 12:26 Treatment Length: 118 (minutes) Treatment Segments: 4 Vital Signs Capillary Blood Glucose Reference Range: 80 - 120 mg / dl HBO Diabetic Blood Glucose Intervention Range: <131 mg/dl or mg/dl Type: Time Vitals Blood Respiratory Capillary Blood Glucose Pulse Action Pulse: Temperature: Taken: Pressure: Rate: Glucose (mg/dl): Meter #: Oximetry (%) Taken: Pre 10:26 130/81 81 22 97.2 none per protocol Post 12:24 125/81 75 20 97.4 none per protocol Treatment Response Treatment Toleration: Well Treatment Completion Status: Treatment Completed without  Adverse Event Physician HBO Attestation: I certify that I supervised this HBO treatment in accordance with Medicare guidelines. A trained emergency response team is readily available per Yes hospital policies and procedures. Continue HBOT as ordered. Yes Electronic Signature(s) Signed: 08/12/2022 12:04:27 PM By: 08/14/2022 CHT EMT BS , , Signed: 08/20/2022 4:22:00 PM By: 10/20/2022 DO Previous Signature: 08/10/2022 10:17:50 AM Version By: 08/12/2022 DO Previous Signature: 08/07/2022 2:00:37 PM Version By: 08/09/2022 CHT EMT BS , , Entered By: [name] on 08/12/2022 12:04:27 -------------------------------------------------------------------------------- HBO Safety Checklist Details Patient Name: Date of Service: 08/14/2022, Jimmy [name]. 08/07/2022 10:00 A M Medical Record Number: 08/09/2022 Patient Account Number: [id number] Date of Birth/Sex: Treating RN: 07/16/1966 (56 y.o. 53 Primary Care Shaniqua Guillot: Jimmy [name] Other Clinician: [name] Referring Viki Carrera: Treating Scarlett Portlock/Extender: [name] in Treatment: 15 HBO Safety Checklist Items Safety Checklist Consent Form Signed Patient voided / foley secured and emptied When did you last eato 0800 Last dose of injectable or oral agent n/a Ostomy pouch emptied and vented if applicable NA All implantable devices assessed, documented and approved NA Intravenous access site secured and place NA Valuables secured Linens and cotton and cotton/polyester blend (less than 51% polyester) Personal oil-based products / skin lotions / body lotions removed Wigs or hairpieces removed NA Smoking or tobacco materials removed NA Books / newspapers / magazines / loose paper removed Cologne, aftershave, perfume and deodorant removed Jewelry removed (may wrap wedding band) Make-up removed NA Hair care products removed Battery operated devices (external)  removed Heating patches and chemical warmers removed Titanium eyewear removed Nail polish cured greater than 10 hours NA Casting material cured greater than 10 hours NA Hearing aids removed NA Loose dentures or partials removed NA Prosthetics have been removed NA Patient demonstrates correct use of air break  device (if applicable) Patient concerns have been addressed Patient grounding bracelet on and cord attached to chamber Specifics for Inpatients (complete in addition to above) Medication sheet sent with patient NA Intravenous medications needed or due during therapy sent with patient NA Drainage tubes (e.g. nasogastric tube or chest tube secured and vented) NA Endotracheal or Tracheotomy tube secured NA Cuff deflated of air and inflated with saline NA Airway suctioned NA Notes Paper version used prior to treatment. Electronic Signature(s) Signed: 08/07/2022 10:58:05 AM By: Donavan Burnet CHT EMT BS , , Entered By: Donavan Burnet on 08/07/2022 10:58:05
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
## 2022-08-14 DIAGNOSIS — M272 Inflammatory conditions of jaws: Secondary | ICD-10-CM | POA: Diagnosis not present

## 2022-08-14 DIAGNOSIS — S0993XA Unspecified injury of face, initial encounter: Secondary | ICD-10-CM | POA: Diagnosis not present

## 2022-08-14 DIAGNOSIS — M269 Dentofacial anomaly, unspecified: Secondary | ICD-10-CM | POA: Diagnosis not present

## 2022-08-14 DIAGNOSIS — S0993XS Unspecified injury of face, sequela: Secondary | ICD-10-CM | POA: Diagnosis not present

## 2022-08-14 DIAGNOSIS — Z21 Asymptomatic human immunodeficiency virus [HIV] infection status: Secondary | ICD-10-CM | POA: Diagnosis not present

## 2022-08-19 ENCOUNTER — Encounter: Payer: Self-pay | Admitting: Infectious Disease

## 2022-08-21 ENCOUNTER — Telehealth: Payer: Self-pay

## 2022-08-21 NOTE — Telephone Encounter (Signed)
Dr.Villaret returned my call. I provided him with Dr.[name] cell phone number to speak to him directly.    [place], CMA

## 2022-08-21 NOTE — Telephone Encounter (Signed)
Left message at [facility] office requesting for someone to return my call to provide Dr.Villaret with Dr.[name] cell phone number regarding patient.    Per Dr.[name] :  If his surgeon can have my cell phone he can give me a call I would be interested in him obtaining intra op cultures and if he will be doing so Ronalee Belts should stop his antibiotics until surgery.

## 2022-08-25 DIAGNOSIS — I10 Essential (primary) hypertension: Secondary | ICD-10-CM | POA: Diagnosis not present

## 2022-08-25 DIAGNOSIS — Z21 Asymptomatic human immunodeficiency virus [HIV] infection status: Secondary | ICD-10-CM | POA: Diagnosis not present

## 2022-08-25 DIAGNOSIS — Z792 Long term (current) use of antibiotics: Secondary | ICD-10-CM | POA: Diagnosis not present

## 2022-08-25 DIAGNOSIS — Z87891 Personal history of nicotine dependence: Secondary | ICD-10-CM | POA: Diagnosis not present

## 2022-08-25 DIAGNOSIS — K219 Gastro-esophageal reflux disease without esophagitis: Secondary | ICD-10-CM | POA: Diagnosis not present

## 2022-08-25 DIAGNOSIS — S0993XA Unspecified injury of face, initial encounter: Secondary | ICD-10-CM | POA: Diagnosis not present

## 2022-08-25 DIAGNOSIS — M272 Inflammatory conditions of jaws: Secondary | ICD-10-CM | POA: Diagnosis not present

## 2022-08-25 DIAGNOSIS — M869 Osteomyelitis, unspecified: Secondary | ICD-10-CM | POA: Diagnosis not present

## 2022-08-25 DIAGNOSIS — M269 Dentofacial anomaly, unspecified: Secondary | ICD-10-CM | POA: Diagnosis not present

## 2022-08-25 DIAGNOSIS — Z9889 Other specified postprocedural states: Secondary | ICD-10-CM | POA: Diagnosis not present

## 2022-08-25 DIAGNOSIS — Z79899 Other long term (current) drug therapy: Secondary | ICD-10-CM | POA: Diagnosis not present

## 2022-08-25 DIAGNOSIS — Z91018 Allergy to other foods: Secondary | ICD-10-CM | POA: Diagnosis not present

## 2022-08-25 DIAGNOSIS — E785 Hyperlipidemia, unspecified: Secondary | ICD-10-CM | POA: Diagnosis not present

## 2022-08-25 DIAGNOSIS — Z880 Allergy status to penicillin: Secondary | ICD-10-CM | POA: Diagnosis not present

## 2022-08-31 ENCOUNTER — Other Ambulatory Visit: Payer: Self-pay | Admitting: Cardiology

## 2022-08-31 NOTE — Telephone Encounter (Signed)
Nitrostat refills sent to pharmacy

## 2022-09-15 DIAGNOSIS — I2510 Atherosclerotic heart disease of native coronary artery without angina pectoris: Secondary | ICD-10-CM | POA: Diagnosis not present

## 2022-09-15 DIAGNOSIS — E7800 Pure hypercholesterolemia, unspecified: Secondary | ICD-10-CM | POA: Diagnosis not present

## 2022-09-15 DIAGNOSIS — I10 Essential (primary) hypertension: Secondary | ICD-10-CM | POA: Diagnosis not present

## 2022-09-15 DIAGNOSIS — I700 Atherosclerosis of aorta: Secondary | ICD-10-CM | POA: Diagnosis not present

## 2022-09-18 ENCOUNTER — Encounter: Payer: Self-pay | Admitting: Cardiology

## 2022-09-28 NOTE — Progress Notes (Unsigned)
Cardiology Office Note:    Date:  09/29/2022   ID:  Jimmy [name], DOB 1966-07-23, MRN [id number]  PCP:  [name], MD  Cardiologist:  Norman Herrlich, MD    Referring MD: [name], MD    ASSESSMENT:    1. Coronary artery disease involving native coronary artery of native heart without angina pectoris   2. Agatston coronary artery calcium score greater than 400   3. Ascending aorta enlargement (HCC)   4. Essential hypertension   5. Mixed hyperlipidemia    PLAN:    In order of problems listed above:  At this point in time with life-threatening GI bleeding ulcer disease ongoing symptoms I do not think he should resume aspirin I am going to put him back on a PPI Protonix.  He is having exertional shortness of breath restart his calcium channel blocker and continue his high intensity statin. Stable hypertension restart calcium channel blocker With CAD continue statin Mild enlargement of his ascending aorta consider CT repeat around the time of his next visit   Next appointment: 6 months   Medication Adjustments/Labs and Tests Ordered: Current medicines are reviewed at length with the patient today.  Concerns regarding medicines are outlined above.  No orders of the defined types were placed in this encounter.  No orders of the defined types were placed in this encounter.   Chief Complaint  Patient presents with   Follow-up   Coronary Artery Disease    History of Present Illness:    Jimmy [name] is a 56 y.o. male with a hx of CAD with a very elevated coronary artery calcium score 560 hypertension and hyperlipidemia last seen 11/24/2021.  Coronary artery calcium score 07/17/2020 severely elevated 516 97th percentile for age and sex 50% stenosis LAD proximally 50% stenosis of the ostial PDA and mild enlargement sinus of Valsalva 41 mm ascending aorta normal caliber.  FFR was normal in the LAD.  FFR was diminished in the distal PDA the vessel was felt to be  small in caliber and not a good choice for PCI.  Compliance with diet, lifestyle and medications: Yes  Patient and his wife related to his hospitalizations and complications with GI bleeding requiring ICU care mechanical ventilation. He had peptic ulcer disease He has coronary artery disease and has atherosclerosis on abdominal CTA Is off aspirin he no longer is taking a PPI and is bothered very much by indigestion and heartburn He previously had exertional shortness of breath we thought was anginal equivalent his CTA showed moderate CAD he was taken off of his calcium channel blocker with hypotension and I think he should resume it. He has been taking his statin. 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   Penicillin allergy 06/24/2022    Past Surgical History:  Procedure Laterality Date   AMPUTATION DISTAL TIP OF LEFT INDEX TIP     ABOUT AGE 60   ESOPHAGOGASTRODUODENOSCOPY (EGD) WITH PROPOFOL N/A 03/27/2022   Procedure: ESOPHAGOGASTRODUODENOSCOPY (EGD) WITH PROPOFOL;  Surgeon: Sherrilyn Rist, MD;  Location: [facility] ENDOSCOPY;  Service: Gastroenterology;  Laterality: N/A;   HOT HEMOSTASIS N/A 03/27/2022   Procedure: HOT HEMOSTASIS (ARGON PLASMA COAGULATION/BICAP);  Surgeon: Sherrilyn Rist, MD;  Location: [facility] ENDOSCOPY;  Service: Gastroenterology;  Laterality: N/A;   SCLEROTHERAPY  03/27/2022   Procedure: SCLEROTHERAPY;  Surgeon: Sherrilyn Rist, MD;  Location: [facility] ENDOSCOPY;  Service: Gastroenterology;;    Current Medications: Current Meds  Medication Sig   bictegravir-emtricitabine-tenofovir AF (BIKTARVY) 50-200-25 MG TABS tablet Take 1 tablet by mouth daily.   cefadroxil (DURICEF) 500 MG capsule Take 2 capsules (1,000 mg  total) by mouth 2 (two) times daily.   chlorhexidine (PERIDEX) 0.12 % solution 15 mLs by Mouth Rinse route See admin instructions. RINSE AND GARGLE 15 ML'S BY MOUTH OR THROAT FOUR TIMES DAILY FOR 14 DAYS AS DIRECTED   metroNIDAZOLE (FLAGYL) 500 MG tablet Take 1 tablet (500 mg total) by mouth 2 (two) times daily.   Multiple Vitamins-Minerals (HAIR SKIN AND NAILS FORMULA PO) Take 1 tablet by mouth daily at 12 noon.   nitroGLYCERIN (NITROSTAT) 0.4 MG SL tablet PLACE 1 TABLET UNDER THE TONGUE EVERY 5 MINUTES AS NEEDED FOR CHEST PAIN     Allergies:   Kiwi extract and Penicillins   Social History   Socioeconomic History   Marital status: Married    Spouse name: Not on file   Number of children: Not on file   Years of education: Not on file   Highest education level: Not on file  Occupational History   Not on file  Tobacco Use   Smoking status: Former    Types: Cigarettes    Quit date: 10/2010    Years since quitting: 11.9   Smokeless tobacco: Never  Vaping Use   Vaping Use: Never used  Substance and Sexual Activity   Alcohol use: Not Currently    Alcohol/week: 1.0 standard drink of alcohol    Types: 1 Cans of beer per week    Comment: occasional beer with dinner   Drug use: Never   Sexual activity: Not Currently    Comment: declined condoms  Other Topics Concern   Not on file  Social History Narrative   Not on file   Social Determinants of Health   Financial Resource Strain: Not on file  Food Insecurity: Not on file  Transportation Needs: Not on file  Physical Activity: Not on file  Stress: Not on file  Social Connections: Not on file     Family History: The patient's family history includes Arrhythmia in his mother; Breast cancer in his sister; Heart attack in his maternal grandfather; Heart disease in his maternal grandfather; Liver cancer in his sister; Lung cancer in his mother. There is no history of Colon cancer or Esophageal cancer. ROS:   Please see the history  of present illness.    All other systems reviewed and are negative.  EKGs/Labs/Other Studies Reviewed:    The following studies were reviewed today:  EKG:  EKG ordered today and personally reviewed.  The ekg ordered today demonstrates sinus rhythm and is normal  Recent Labs: 03/30/2022: Magnesium 1.8 07/29/2022: ALT 25; BUN 13; Creat 0.91; Hemoglobin 16.2; Platelets 388; Potassium 4.9; Sodium 138  Recent Lipid Panel    Component Value Date/Time   CHOL 159 06/24/2022 0317   CHOL 105 09/05/2020 1603   TRIG 265 (H) 06/24/2022 0317   HDL 41 06/24/2022 0317   HDL 34 (L) 09/05/2020 1603   CHOLHDL 3.9 06/24/2022 0317   LDLCALC 83 06/24/2022 0317    Physical Exam:    VS:  BP 134/80 (BP Location: Right Arm, Patient Position: Sitting)   Pulse 86   Ht 5\' 11"  (1.803  m)   Wt 216 lb (98 kg)   SpO2 98%   BMI 30.13 kg/m     Wt Readings from Last 3 Encounters:  09/29/22 216 lb (98 kg)  07/29/22 205 lb (93 kg)  06/24/22 201 lb (91.2 kg)     GEN:  Well nourished, well developed in no acute distress HEENT: Normal NECK: No JVD; No carotid bruits LYMPHATICS: No lymphadenopathy CARDIAC: RRR, no murmurs, rubs, gallops RESPIRATORY:  Clear to auscultation without rales, wheezing or rhonchi  ABDOMEN: Soft, non-tender, non-distended MUSCULOSKELETAL:  No edema; No deformity  SKIN: Warm and dry NEUROLOGIC:  Alert and oriented x 3 PSYCHIATRIC:  Normal affect    Signed, Norman Herrlich, MD  09/29/2022 1:09 PM    [facility]

## 2022-09-29 ENCOUNTER — Ambulatory Visit: Payer: BC Managed Care – PPO | Attending: Cardiology | Admitting: Cardiology

## 2022-09-29 ENCOUNTER — Encounter: Payer: Self-pay | Admitting: Cardiology

## 2022-09-29 VITALS — BP 134/80 | HR 86 | Ht 71.0 in | Wt 216.0 lb

## 2022-09-29 DIAGNOSIS — I7789 Other specified disorders of arteries and arterioles: Secondary | ICD-10-CM | POA: Diagnosis not present

## 2022-09-29 DIAGNOSIS — E782 Mixed hyperlipidemia: Secondary | ICD-10-CM

## 2022-09-29 DIAGNOSIS — R931 Abnormal findings on diagnostic imaging of heart and coronary circulation: Secondary | ICD-10-CM

## 2022-09-29 DIAGNOSIS — I10 Essential (primary) hypertension: Secondary | ICD-10-CM

## 2022-09-29 DIAGNOSIS — I2510 Atherosclerotic heart disease of native coronary artery without angina pectoris: Secondary | ICD-10-CM | POA: Diagnosis not present

## 2022-09-29 MED ORDER — PANTOPRAZOLE SODIUM 40 MG PO TBEC: 40.0000 mg | DELAYED_RELEASE_TABLET | Freq: Every day | ORAL | 11 refills | Status: DC

## 2022-09-29 MED ORDER — DILTIAZEM HCL ER COATED BEADS 240 MG PO CP24: 240.0000 mg | ORAL_CAPSULE | Freq: Every day | ORAL | 3 refills | Status: DC

## 2022-09-29 NOTE — Patient Instructions (Signed)
Medication Instructions:  Your physician has recommended you make the following change in your medication:   START: Protonix 40 mg daily START: Diltiazem 240 mg daily  *If you need a refill on your cardiac medications before your next appointment, please call your pharmacy*   Lab Work: None If you have labs (blood work) drawn today and your tests are completely normal, you will receive your results only by: MyChart Message (if you have MyChart) OR A paper copy in the mail If you have any lab test that is abnormal or we need to change your treatment, we will call you to review the results.   Testing/Procedures: None   Follow-Up: At [facility], you and your health needs are our priority.  As part of our continuing mission to provide you with exceptional heart care, we have created designated Provider Care Teams.  These Care Teams include your primary Cardiologist (physician) and Advanced Practice Providers (APPs -  Physician Assistants and Nurse Practitioners) who all work together to provide you with the care you need, when you need it.  We recommend signing up for the patient portal called "MyChart".  Sign up information is provided on this After Visit Summary.  MyChart is used to connect with patients for Virtual Visits (Telemedicine).  Patients are able to view lab/test results, encounter notes, upcoming appointments, etc.  Non-urgent messages can be sent to your provider as well.   To learn more about what you can do with MyChart, go to [email or web address].    Your next appointment:   6 month(s)  The format for your next appointment:   In Person  Provider:   Norman Herrlich, MD    Other Instructions None  Important Information About Sugar
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
## 2022-10-14 ENCOUNTER — Other Ambulatory Visit: Payer: Self-pay

## 2022-10-14 ENCOUNTER — Encounter: Payer: Self-pay | Admitting: Infectious Disease

## 2022-10-14 ENCOUNTER — Other Ambulatory Visit (HOSPITAL_COMMUNITY)
Admission: RE | Admit: 2022-10-14 | Discharge: 2022-10-14 | Disposition: A | Discharge status: Home / Self Care | Payer: BC Managed Care – PPO | Source: Ambulatory Visit | Attending: Infectious Disease | Admitting: Infectious Disease

## 2022-10-14 ENCOUNTER — Ambulatory Visit (INDEPENDENT_AMBULATORY_CARE_PROVIDER_SITE_OTHER): Payer: BC Managed Care – PPO | Admitting: Infectious Disease

## 2022-10-14 VITALS — BP 138/80 | HR 77 | Resp 16 | Ht 71.0 in

## 2022-10-14 DIAGNOSIS — I10 Essential (primary) hypertension: Secondary | ICD-10-CM

## 2022-10-14 DIAGNOSIS — Z7185 Encounter for immunization safety counseling: Secondary | ICD-10-CM

## 2022-10-14 DIAGNOSIS — M869 Osteomyelitis, unspecified: Secondary | ICD-10-CM

## 2022-10-14 DIAGNOSIS — M272 Inflammatory conditions of jaws: Secondary | ICD-10-CM | POA: Diagnosis not present

## 2022-10-14 DIAGNOSIS — B20 Human immunodeficiency virus [HIV] disease: Secondary | ICD-10-CM | POA: Insufficient documentation

## 2022-10-14 HISTORY — DX: Osteomyelitis, unspecified: M86.9

## 2022-10-14 MED ORDER — BIKTARVY 50-200-25 MG PO TABS: 1.0000 | ORAL_TABLET | Freq: Every day | ORAL | 11 refills | Status: DC

## 2022-10-14 MED ORDER — FLUCONAZOLE 200 MG PO TABS: 400.0000 mg | ORAL_TABLET | Freq: Every day | ORAL | 11 refills | Status: DC

## 2022-10-14 MED ORDER — CEFADROXIL 500 MG PO CAPS: 1000.0000 mg | ORAL_CAPSULE | Freq: Two times a day (BID) | ORAL | 11 refills | Status: DC

## 2022-10-15 LAB — URINE CYTOLOGY ANCILLARY ONLY
Chlamydia: NEGATIVE
Comment: NEGATIVE
Comment: NORMAL
Neisseria Gonorrhea: NEGATIVE

## 2022-10-15 LAB — T-HELPER CELLS (CD4) COUNT (NOT AT ARMC)
CD4 % Helper T Cell: 53 % (ref 33–65)
CD4 T Cell Abs: 840 /uL (ref 400–1790)

## 2022-10-17 LAB — COMPLETE METABOLIC PANEL WITH GFR
AG Ratio: 2 (calc) (ref 1.0–2.5)
ALT: 28 U/L (ref 9–46)
AST: 22 U/L (ref 10–35)
Albumin: 4.8 g/dL (ref 3.6–5.1)
Alkaline phosphatase (APISO): 59 U/L (ref 35–144)
BUN: 8 mg/dL (ref 7–25)
CO2: 24 mmol/L (ref 20–32)
Calcium: 9.9 mg/dL (ref 8.6–10.3)
Chloride: 105 mmol/L (ref 98–110)
Creat: 0.83 mg/dL (ref 0.70–1.30)
Globulin: 2.4 g/dL (calc) (ref 1.9–3.7)
Glucose, Bld: 88 mg/dL (ref 65–99)
Potassium: 4.1 mmol/L (ref 3.5–5.3)
Sodium: 140 mmol/L (ref 135–146)
Total Bilirubin: 0.4 mg/dL (ref 0.2–1.2)
Total Protein: 7.2 g/dL (ref 6.1–8.1)
eGFR: 103 mL/min/{1.73_m2} (ref >=60)

## 2022-10-17 LAB — CBC WITH DIFFERENTIAL/PLATELET
Absolute Monocytes: 448 cells/uL (ref 200–950)
Basophils Absolute: 48 cells/uL (ref 0–200)
Basophils Relative: 0.6 %
Eosinophils Absolute: 240 cells/uL (ref 15–500)
Eosinophils Relative: 3 %
HCT: 48.9 % (ref 38.5–50.0)
Hemoglobin: 17.1 g/dL (ref 13.2–17.1)
Lymphs Abs: 1824 cells/uL (ref 850–3900)
MCH: 32.3 pg (ref 27.0–33.0)
MCHC: 35 g/dL (ref 32.0–36.0)
MCV: 92.4 fL (ref 80.0–100.0)
MPV: 11 fL (ref 7.5–12.5)
Monocytes Relative: 5.6 %
Neutro Abs: 5440 cells/uL (ref 1500–7800)
Neutrophils Relative %: 68 %
Platelets: 330 10*3/uL (ref 140–400)
RBC: 5.29 10*6/uL (ref 4.20–5.80)
RDW: 13.2 % (ref 11.0–15.0)
Total Lymphocyte: 22.8 %
WBC: 8 10*3/uL (ref 3.8–10.8)

## 2022-10-17 LAB — HIV-1 RNA QUANT-NO REFLEX-BLD
HIV 1 RNA Quant: NOT DETECTED Copies/mL
HIV-1 RNA Quant, Log: NOT DETECTED Log cps/mL

## 2022-10-17 LAB — C-REACTIVE PROTEIN: CRP: 2.3 mg/L (ref <8.0)

## 2022-10-17 LAB — SEDIMENTATION RATE: Sed Rate: 2 mm/h (ref 0–20)

## 2022-10-17 LAB — HEPATITIS B SURFACE ANTIBODY, QUANTITATIVE: Hep B S AB Quant (Post): 562 m[IU]/mL (ref >=10)

## 2022-10-17 LAB — RPR: RPR Ser Ql: NONREACTIVE

## 2022-10-26 ENCOUNTER — Other Ambulatory Visit: Payer: Self-pay | Admitting: Cardiology

## 2022-11-20 DIAGNOSIS — T84498A Other mechanical complication of other internal orthopedic devices, implants and grafts, initial encounter: Secondary | ICD-10-CM

## 2022-11-20 DIAGNOSIS — M272 Inflammatory conditions of jaws: Secondary | ICD-10-CM | POA: Diagnosis not present

## 2022-11-20 DIAGNOSIS — M269 Dentofacial anomaly, unspecified: Secondary | ICD-10-CM | POA: Diagnosis not present

## 2022-11-20 DIAGNOSIS — I82621 Acute embolism and thrombosis of deep veins of right upper extremity: Secondary | ICD-10-CM | POA: Diagnosis not present

## 2022-11-20 DIAGNOSIS — Z21 Asymptomatic human immunodeficiency virus [HIV] infection status: Secondary | ICD-10-CM | POA: Diagnosis not present

## 2022-11-20 HISTORY — DX: Other mechanical complication of other internal orthopedic devices, implants and grafts, initial encounter: T84.498A

## 2022-12-15 ENCOUNTER — Encounter: Payer: Self-pay | Admitting: Cardiology

## 2022-12-16 ENCOUNTER — Other Ambulatory Visit: Payer: Self-pay

## 2022-12-18 ENCOUNTER — Other Ambulatory Visit: Payer: Self-pay

## 2022-12-18 ENCOUNTER — Telehealth: Payer: Self-pay

## 2022-12-18 NOTE — Telephone Encounter (Signed)
Called patient's wife regarding her question about the patient's rosuvastatin and she also asked that the office visit from 11/14/ 23 be resubmitted to their insurance because she believes it was submitted incorrectly. I sent a message to Ciro Backer to ask her if she could resubmit the office visit to their insurance.
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
## 2023-03-29 ENCOUNTER — Encounter: Payer: Self-pay | Admitting: Cardiology

## 2023-04-21 ENCOUNTER — Encounter: Payer: Self-pay | Admitting: Infectious Disease

## 2023-04-23 ENCOUNTER — Other Ambulatory Visit (HOSPITAL_COMMUNITY): Payer: Self-pay

## 2023-04-26 ENCOUNTER — Telehealth: Payer: Self-pay

## 2023-04-26 ENCOUNTER — Other Ambulatory Visit (HOSPITAL_COMMUNITY): Payer: Self-pay

## 2023-04-26 NOTE — Telephone Encounter (Signed)
Pharmacy Patient Advocate Encounter  Prior Authorization for PANTOPRAZOLE  has been approved.   PA# [id number] Effective dates: 04/26/23 through 04/25/24    Received notification from [facility] MEDICARE that prior authorization for PANTOPRAZOLE is needed.    PA submitted on 04/26/23 Key [id number]  Haze Rushing, CPhT Pharmacy Patient Advocate Specialist Direct Number: [phone] Fax: [phone]

## 2023-04-26 NOTE — Telephone Encounter (Signed)
PA initiated, please see separate encounter for updates on determination. (I will route you back in once a decision has been made)  Bryttney Netzer, CPhT Pharmacy Patient Advocate Specialist Direct Number: [phone] Fax: [phone]  

## 2023-04-27 ENCOUNTER — Other Ambulatory Visit (HOSPITAL_COMMUNITY): Payer: Self-pay

## 2023-04-27 NOTE — Telephone Encounter (Signed)
Informed patients wife of prior authorization was approved for Pantoprazole

## 2023-09-08 ENCOUNTER — Other Ambulatory Visit: Payer: Self-pay

## 2023-09-08 NOTE — Progress Notes (Unsigned)
Cardiology Office Note:    Date:  09/09/2023   ID:  Jimmy [name], DOB [birth year], MRN [id number]  PCP:  [name], MD  Cardiologist:  Norman Herrlich, MD    Referring MD: [name], MD    ASSESSMENT:    1. Coronary artery disease involving native coronary artery of native heart without angina pectoris   2. Agatston coronary artery calcium score greater than 400   3. Ascending aorta enlargement (HCC)   4. Essential hypertension   5. Mixed hyperlipidemia    PLAN:    In order of problems listed above:  Stable CAD no anginal discomfort continue medical therapy restart statin and for the time being avoid aspirin with his GI bleed  consider follow-up CT next visit  continue his calcium channel blocker He will resume statin therapy follow-up lipid profile after 2 months   Next appointment: 1 year   Medication Adjustments/Labs and Tests Ordered: Current medicines are reviewed at length with the patient today.  Concerns regarding medicines are outlined above.  No orders of the defined types were placed in this encounter.  No orders of the defined types were placed in this encounter.    History of Present Illness:    Jimmy [name] is a 57 y.o. male with a hx of CAD with a very elevated coronary calcium score of 560/97th percentile 50% stenosis LAD and ostial PAD mild enlargement sinus Valsalva 41 mm hypertension and hyper lipidemia last seen in 09/29/2022. Compliance with diet, lifestyle and medications: Yes  I reviewed his medications.  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   diltiazem (CARDIZEM CD) 240 MG 24 hr capsule Take 1 capsule (240 mg total) by mouth daily.   nitroGLYCERIN (NITROSTAT) 0.4 MG SL tablet PLACE 1 TABLET UNDER THE TONGUE EVERY 5 MINUTES AS NEEDED FOR CHEST PAIN      EKGs/Labs/Other Studies Reviewed:    The following studies were reviewed today:  Cardiac Studies & Procedures       ECHOCARDIOGRAM  ECHOCARDIOGRAM COMPLETE 07/12/2020  Narrative ECHOCARDIOGRAM REPORT    Patient Name:   Jimmy [name] Date of Exam: 07/12/2020 Medical Rec #:  [id number]       Height:       71.0 in Accession #:    [id number]      Weight:       208.8 lb Date of Birth:  December 03, 1965       BSA:          2.148 m Patient Age:    54 years        BP:           126/74 mmHg Patient Gender: M               HR:  86 bpm. Exam Location:  [facility]  Procedure: 2D Echo  Indications:    Shortness of breath [R06.02 (ICD-10-CM)];  History:        Patient has no prior history of Echocardiogram examinations. Signs/Symptoms:Chest Pain; Risk Factors:Hypertension and Dyslipidemia.  Sonographer:    Louie Boston Referring Phys: [phone] Kymani Laursen J Raegen Tarpley  IMPRESSIONS   1. Left ventricular ejection fraction, by estimation, is 60 to 65%. The left ventricle has normal function. The left ventricle has no regional wall motion abnormalities. Left ventricular diastolic parameters are consistent with Grade I diastolic dysfunction (impaired relaxation). 2. Right ventricular systolic function is normal. The right ventricular size is normal. 3. The mitral valve is normal in structure. No evidence of mitral valve regurgitation. No evidence of mitral stenosis. 4.  The aortic valve is tricuspid. Aortic valve regurgitation is not visualized. No aortic stenosis is present. 5. The inferior vena cava is normal in size with greater than 50% respiratory variability, suggesting right atrial pressure of 3 mmHg.  FINDINGS Left Ventricle: Left ventricular ejection fraction, by estimation, is 60 to 65%. The left ventricle has normal function. The left ventricle has no regional wall motion abnormalities. The left ventricular internal cavity size was normal in size. There is no left ventricular hypertrophy. Left ventricular diastolic parameters are consistent with Grade I diastolic dysfunction (impaired relaxation). Normal left ventricular filling pressure.  Right Ventricle: The right ventricular size is normal. No increase in right ventricular wall thickness. Right ventricular systolic function is normal.  Left Atrium: Left atrial size was normal in size.  Right Atrium: Right atrial size was normal in size.  Pericardium: There is no evidence of pericardial effusion.  Mitral Valve: The mitral valve is normal in structure. Normal mobility of the mitral valve leaflets. No evidence of mitral valve regurgitation. No evidence of mitral valve stenosis.  Tricuspid Valve: The tricuspid valve is normal in structure. Tricuspid valve regurgitation is not demonstrated. No evidence of tricuspid stenosis.  Aortic Valve: The aortic valve is tricuspid. Aortic valve regurgitation is not visualized. No aortic stenosis is present.  Pulmonic Valve: The pulmonic valve was normal in structure. Pulmonic valve regurgitation is not visualized. No evidence of pulmonic stenosis.  Aorta: The aortic root and ascending aorta are structurally normal, with no evidence of dilitation.  Venous: The pulmonary veins were not well visualized. The inferior vena cava is normal in size with greater than 50% respiratory variability, suggesting right atrial pressure of 3 mmHg.  IAS/Shunts: No atrial level  shunt detected by color flow Doppler.   LEFT VENTRICLE PLAX 2D LVIDd:         3.70 cm  Diastology LVIDs:         2.75 cm  LV e' lateral:   9.68 cm/s LV PW:         1.20 cm  LV E/e' lateral: 7.8 LV IVS:        1.10 cm  LV e' medial:    6.09 cm/s LVOT diam:     2.40 cm  LV E/e' medial:  12.4 LV SV:         95 LV SV Index:   44 LVOT Area:     4.52 cm   RIGHT VENTRICLE             IVC RV S prime:     13.40 cm/s  IVC diam: 1.70 cm TAPSE (M-mode): 2.7 cm  LEFT ATRIUM             Index  RIGHT ATRIUM           Index LA diam:        3.90 cm 1.82 cm/m  RA Area:     15.40 cm LA Vol (A2C):   42.3 ml 19.70 ml/m RA Volume:   39.80 ml  18.53 ml/m LA Vol (A4C):   54.4 ml 25.33 ml/m LA Biplane Vol: 47.9 ml 22.30 ml/m AORTIC VALVE LVOT Vmax:   114.00 cm/s LVOT Vmean:  76.100 cm/s LVOT VTI:    0.209 m  AORTA Ao Root diam: 3.50 cm Ao Asc diam:  3.50 cm  MITRAL VALVE MV Area (PHT): 4.60 cm     SHUNTS MV Decel Time: 165 msec     Systemic VTI:  0.21 m MV E velocity: 75.70 cm/s   Systemic Diam: 2.40 cm MV A velocity: 100.00 cm/s MV E/A ratio:  0.76  Norman Herrlich MD Electronically signed by Norman Herrlich MD Signature Date/Time: 07/12/2020/4:41:16 PM    Final     CT SCANS  CT CORONARY MORPH W/CTA COR W/SCORE 07/17/2020  Addendum 07/18/2020  9:13 AM ADDENDUM REPORT: 07/18/2020 09:10  CLINICAL DATA:  57 year old with shortness of breath, HTN, chest pain  EXAM: Cardiac/Coronary  CTA  TECHNIQUE: The patient was scanned on a [employer].  FINDINGS: A 120 kV prospective scan was triggered in the descending thoracic aorta at 111 HU's. Axial non-contrast 3 mm slices were carried out through the heart. The data set was analyzed on a dedicated work station and scored using the Agatson method. Gantry rotation speed was 250 msecs and collimation was .6 mm. Beta blockade and 0.8 mg of sl NTG was given. The 3D data set was reconstructed in 5% intervals of the 67-82 %  of the R-R cycle. Diastolic phases were analyzed on a dedicated work station using MPR, MIP and VRT modes. The patient received 80 cc of contrast.  Aorta: Mildly dilated. 41 mm at sinus of Valsalva, 34 mm ascending aorta, 28 mm descending. Diffuse atherosclerosis, with descending grade 4 complex atheroma. No dissection.  Aortic Valve:  Trileaflet.  No calcifications.  Coronary Arteries:  Normal coronary origin.  Right dominance.  RCA is a large dominant artery that gives rise to PDA and PLA. There is proximal soft plaque of 0-24% followed by focal calcified plaque, 0-24%. There is calcified plaque at the bifurcation of PDA with possible ostial PDA stenosis of 50%. Sending for FFR.  Left main is a large artery that gives rise to LAD and LCX arteries. There is calcified plaque, 0-24% stenosis, non flow limiting.  LAD is a large vessel that has mixed calcified and non calcified plaque, possible 50% proximally. Will send for FFR. There is a large first diagonal with proximal calcified plaque 0-24%. Focal calcified plaque in mid LAD, 25-49% stenosis at the bifurcation of second diagonal.  LCX is a non-dominant artery that gives rise to one large OM1 branch. There is scattered calcified and non calcified plaque 0-24%.  Other findings:  Normal pulmonary vein drainage into the left atrium.  Normal left atrial appendage without a thrombus.  Normal size of the pulmonary artery.  Please see radiology report for non cardiac findings.  IMPRESSION: 1. Coronary calcium score of 516. This was 59 percentile for age and sex matched control.  2. Normal coronary origin with right dominance.  3. 50% stenosis of LAD proximally with scattered mixed plaque. Possible 50% stenosis of ostium of PDA. 0-24% stenosis scattered calcified plaque of circumflex. Sending for  FFR analysis.  4. Aorta: Mildly dilated. 41 mm at sinus of Valsalva, 34 mm ascending aorta, 28 mm descending. Diffuse  atherosclerosis, with descending grade 4 complex atheroma.  5. CAD-RADS 3. Moderate stenosis. Consider symptom-guided anti-ischemic pharmacotherapy as well as risk factor modification per guideline directed care. Additional analysis with CT FFR will be submitted.  Donato Schultz, MD [facility]   Electronically Signed By: Donato Schultz MD On: 07/18/2020 09:10  Narrative EXAM: OVER-READ INTERPRETATION  CT CHEST  The following report is an over-read performed by radiologist Dr. Jeronimo Greaves of [facility] on 07/17/2020. This over-read does not include interpretation of cardiac or coronary anatomy or pathology. The coronary CTA interpretation by the cardiologist is attached.  COMPARISON:  Chest radiograph 08/17/2017 from [facility].  FINDINGS: Vascular: Aortic atherosclerosis. No central pulmonary embolism, on this non-dedicated study.  Mediastinum/Nodes: No imaged thoracic adenopathy.  Lungs/Pleura: No pleural fluid.  Clear imaged lungs.  Upper Abdomen: Normal imaged portions of the liver, spleen, stomach  Musculoskeletal: No acute osseous abnormality.  IMPRESSION: 1.  No acute findings in the imaged extracardiac chest. 2.  Aortic Atherosclerosis (ICD10-I70.0).  Electronically Signed: By: Jeronimo Greaves M.D. On: 07/17/2020 16:48              Recent Labs: 10/14/2022: ALT 28; BUN 8; Creat 0.83; Hemoglobin 17.1; Platelets 330; Potassium 4.1; Sodium 140  Recent Lipid Panel    Component Value Date/Time   CHOL 159 06/24/2022 0317   CHOL 105 09/05/2020 1603   TRIG 265 (H) 06/24/2022 0317   HDL 41 06/24/2022 0317   HDL 34 (L) 09/05/2020 1603   CHOLHDL 3.9 06/24/2022 0317   LDLCALC 83 06/24/2022 0317    Physical Exam:    VS:  BP 134/70   Pulse 78   Ht 5\' 11"  (1.803 m)   Wt 217 lb 3.2 oz (98.5 kg)   SpO2 94%   BMI 30.29 kg/m     Wt Readings from Last 3 Encounters:  09/09/23 217 lb 3.2 oz (98.5 kg)  09/29/22 216 lb (98 kg)  07/29/22 205 lb (93 kg)      GEN:  Well nourished, well developed in no acute distress HEENT: Normal NECK: No JVD; No carotid bruits LYMPHATICS: No lymphadenopathy CARDIAC: RRR, no murmurs, rubs, gallops RESPIRATORY:  Clear to auscultation without rales, wheezing or rhonchi  ABDOMEN: Soft, non-tender, non-distended MUSCULOSKELETAL:  No edema; No deformity  SKIN: Warm and dry NEUROLOGIC:  Alert and oriented x 3 PSYCHIATRIC:  Normal affect    Signed, Norman Herrlich, MD  09/09/2023 2:39 PM    [facility]

## 2023-09-09 ENCOUNTER — Encounter: Payer: Self-pay | Admitting: Cardiology

## 2023-09-09 ENCOUNTER — Ambulatory Visit: Payer: 59 | Attending: Cardiology | Admitting: Cardiology

## 2023-09-09 VITALS — BP 134/70 | HR 78 | Ht 71.0 in | Wt 217.2 lb

## 2023-09-09 DIAGNOSIS — I10 Essential (primary) hypertension: Secondary | ICD-10-CM | POA: Diagnosis not present

## 2023-09-09 DIAGNOSIS — R931 Abnormal findings on diagnostic imaging of heart and coronary circulation: Secondary | ICD-10-CM | POA: Diagnosis not present

## 2023-09-09 DIAGNOSIS — I7789 Other specified disorders of arteries and arterioles: Secondary | ICD-10-CM

## 2023-09-09 DIAGNOSIS — I2510 Atherosclerotic heart disease of native coronary artery without angina pectoris: Secondary | ICD-10-CM

## 2023-09-09 DIAGNOSIS — E782 Mixed hyperlipidemia: Secondary | ICD-10-CM

## 2023-09-09 MED ORDER — ROSUVASTATIN CALCIUM 40 MG PO TABS: 40.0000 mg | ORAL_TABLET | Freq: Every day | ORAL | 3 refills | Status: DC

## 2023-09-09 NOTE — Patient Instructions (Signed)
Medication Instructions:  Your physician has recommended you make the following change in your medication:   START: Rosuvastatin 40 mg daily  *If you need a refill on your cardiac medications before your next appointment, please call your pharmacy*   Lab Work: Your physician recommends that you return for lab work in:   Labs in 2 months: CMP, Lipid  If you have labs (blood work) drawn today and your tests are completely normal, you will receive your results only by: MyChart Message (if you have MyChart) OR A paper copy in the mail If you have any lab test that is abnormal or we need to change your treatment, we will call you to review the results.   Testing/Procedures: None   Follow-Up: At [facility], you and your health needs are our priority.  As part of our continuing mission to provide you with exceptional heart care, we have created designated Provider Care Teams.  These Care Teams include your primary Cardiologist (physician) and Advanced Practice Providers (APPs -  Physician Assistants and Nurse Practitioners) who all work together to provide you with the care you need, when you need it.  We recommend signing up for the patient portal called "MyChart".  Sign up information is provided on this After Visit Summary.  MyChart is used to connect with patients for Virtual Visits (Telemedicine).  Patients are able to view lab/test results, encounter notes, upcoming appointments, etc.  Non-urgent messages can be sent to your provider as well.   To learn more about what you can do with MyChart, go to [email or web address].    Your next appointment:   1 year(s)  Provider:   Norman Herrlich, MD    Other Instructions None

## 2023-09-13 ENCOUNTER — Encounter: Payer: Self-pay | Admitting: Cardiology

## 2023-09-13 ENCOUNTER — Other Ambulatory Visit: Payer: Self-pay

## 2023-09-13 MED ORDER — BEMPEDOIC ACID 180 MG PO TABS: 180.0000 mg | ORAL_TABLET | Freq: Every day | ORAL | 3 refills | Status: DC

## 2023-10-11 ENCOUNTER — Other Ambulatory Visit: Payer: Self-pay | Admitting: Cardiology

## 2023-10-11 ENCOUNTER — Encounter: Payer: Self-pay | Admitting: Infectious Disease

## 2023-10-11 DIAGNOSIS — I2510 Atherosclerotic heart disease of native coronary artery without angina pectoris: Secondary | ICD-10-CM | POA: Insufficient documentation

## 2023-10-11 HISTORY — DX: Atherosclerotic heart disease of native coronary artery without angina pectoris: I25.10
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
## 2023-10-12 ENCOUNTER — Ambulatory Visit (INDEPENDENT_AMBULATORY_CARE_PROVIDER_SITE_OTHER): Payer: 59 | Admitting: Infectious Disease

## 2023-10-12 ENCOUNTER — Other Ambulatory Visit: Payer: Self-pay

## 2023-10-12 VITALS — BP 131/84 | HR 80 | Temp 98.0°F | Ht 71.0 in | Wt 219.0 lb

## 2023-10-12 DIAGNOSIS — B20 Human immunodeficiency virus [HIV] disease: Secondary | ICD-10-CM | POA: Diagnosis not present

## 2023-10-12 DIAGNOSIS — I2510 Atherosclerotic heart disease of native coronary artery without angina pectoris: Secondary | ICD-10-CM

## 2023-10-12 DIAGNOSIS — E782 Mixed hyperlipidemia: Secondary | ICD-10-CM | POA: Diagnosis not present

## 2023-10-12 DIAGNOSIS — Z7185 Encounter for immunization safety counseling: Secondary | ICD-10-CM

## 2023-10-12 DIAGNOSIS — M272 Inflammatory conditions of jaws: Secondary | ICD-10-CM | POA: Diagnosis not present

## 2023-10-12 DIAGNOSIS — M869 Osteomyelitis, unspecified: Secondary | ICD-10-CM

## 2023-10-12 MED ORDER — BIKTARVY 50-200-25 MG PO TABS: 1.0000 | ORAL_TABLET | Freq: Every day | ORAL | 11 refills | Status: DC

## 2023-10-12 NOTE — Addendum Note (Signed)
Addended by: Harley Alto on: 10/12/2023 01:15 PM   Modules accepted: Orders

## 2023-10-13 LAB — T-HELPER CELLS (CD4) COUNT (NOT AT ARMC)
CD4 % Helper T Cell: 52 % (ref 33–65)
CD4 T Cell Abs: 926 /uL (ref 400–1790)

## 2023-10-14 LAB — CBC WITH DIFFERENTIAL/PLATELET
Absolute Lymphocytes: 1825 {cells}/uL (ref 850–3900)
Absolute Monocytes: 348 {cells}/uL (ref 200–950)
Basophils Absolute: 42 {cells}/uL (ref 0–200)
Basophils Relative: 0.8 %
Eosinophils Absolute: 161 {cells}/uL (ref 15–500)
Eosinophils Relative: 3.1 %
HCT: 47.4 % (ref 38.5–50.0)
Hemoglobin: 16.3 g/dL (ref 13.2–17.1)
MCH: 32.6 pg (ref 27.0–33.0)
MCHC: 34.4 g/dL (ref 32.0–36.0)
MCV: 94.8 fL (ref 80.0–100.0)
MPV: 10.2 fL (ref 7.5–12.5)
Monocytes Relative: 6.7 %
Neutro Abs: 2824 {cells}/uL (ref 1500–7800)
Neutrophils Relative %: 54.3 %
Platelets: 369 10*3/uL (ref 140–400)
RBC: 5 10*6/uL (ref 4.20–5.80)
RDW: 12.3 % (ref 11.0–15.0)
Total Lymphocyte: 35.1 %
WBC: 5.2 10*3/uL (ref 3.8–10.8)

## 2023-10-14 LAB — COMPLETE METABOLIC PANEL WITH GFR
AG Ratio: 1.7 (calc) (ref 1.0–2.5)
ALT: 20 U/L (ref 9–46)
AST: 18 U/L (ref 10–35)
Albumin: 4.4 g/dL (ref 3.6–5.1)
Alkaline phosphatase (APISO): 67 U/L (ref 35–144)
BUN: 8 mg/dL (ref 7–25)
CO2: 25 mmol/L (ref 20–32)
Calcium: 9.5 mg/dL (ref 8.6–10.3)
Chloride: 101 mmol/L (ref 98–110)
Creat: 0.97 mg/dL (ref 0.70–1.30)
Globulin: 2.6 g/dL (ref 1.9–3.7)
Glucose, Bld: 92 mg/dL (ref 65–99)
Potassium: 4.3 mmol/L (ref 3.5–5.3)
Sodium: 137 mmol/L (ref 135–146)
Total Bilirubin: 0.5 mg/dL (ref 0.2–1.2)
Total Protein: 7 g/dL (ref 6.1–8.1)
eGFR: 92 mL/min/{1.73_m2} (ref >=60)

## 2023-10-14 LAB — LIPID PANEL
Cholesterol: 207 mg/dL — ABNORMAL HIGH (ref <200)
HDL: 34 mg/dL — ABNORMAL LOW (ref >=40)
LDL Cholesterol (Calc): 136 mg/dL — ABNORMAL HIGH
Non-HDL Cholesterol (Calc): 173 mg/dL — ABNORMAL HIGH (ref <130)
Total CHOL/HDL Ratio: 6.1 (calc) — ABNORMAL HIGH (ref <5.0)
Triglycerides: 220 mg/dL — ABNORMAL HIGH (ref <150)

## 2023-10-14 LAB — RPR: RPR Ser Ql: NONREACTIVE

## 2023-10-14 LAB — HIV-1 RNA QUANT-NO REFLEX-BLD
HIV 1 RNA Quant: NOT DETECTED {copies}/mL
HIV-1 RNA Quant, Log: NOT DETECTED {Log}

## 2023-10-14 LAB — SEDIMENTATION RATE: Sed Rate: 6 mm/h (ref 0–20)

## 2023-10-18 ENCOUNTER — Telehealth: Payer: Self-pay | Admitting: Cardiology

## 2023-10-18 ENCOUNTER — Encounter: Payer: Self-pay | Admitting: Cardiology

## 2023-10-18 NOTE — Telephone Encounter (Signed)
Office calling to get a copy of patient last lab with our office. Fax # [phone]

## 2023-10-19 ENCOUNTER — Other Ambulatory Visit: Payer: Self-pay

## 2023-10-19 NOTE — Telephone Encounter (Signed)
Called [facility] and they stated that they would like the patient's labs from his office visit on 10/24 faxed to their office. I explained that those labs were to check his cholesterol levels and they would not be completed until around 11/09/23. I explained that we would fax them when they were completed. They were agreeable with this plan and had no further questions at this time.

## 2023-10-25 ENCOUNTER — Other Ambulatory Visit: Payer: Self-pay | Admitting: Infectious Disease

## 2023-10-25 ENCOUNTER — Other Ambulatory Visit: Payer: Self-pay | Admitting: Cardiology

## 2023-12-13 ENCOUNTER — Other Ambulatory Visit: Payer: Self-pay | Admitting: Cardiology

## 2024-03-28 NOTE — Progress Notes (Signed)
 The 10-year ASCVD risk score (Arnett DK, et al., 2019) is: 11.4%   Values used to calculate the score:     Age: 58 years     Sex: Male     Is Non-Hispanic African American: No     Diabetic: No     Tobacco smoker: No     Systolic Blood Pressure: 131 mmHg     Is BP treated: Yes     HDL Cholesterol: 34 mg/dL     Total Cholesterol: 207 mg/dL  Arlon Bergamo, BSN, RN

## 2024-04-06 DIAGNOSIS — M272 Inflammatory conditions of jaws: Secondary | ICD-10-CM | POA: Diagnosis not present

## 2024-04-06 DIAGNOSIS — S0993XD Unspecified injury of face, subsequent encounter: Secondary | ICD-10-CM | POA: Diagnosis not present

## 2024-04-07 ENCOUNTER — Other Ambulatory Visit (HOSPITAL_BASED_OUTPATIENT_CLINIC_OR_DEPARTMENT_OTHER): Payer: Self-pay

## 2024-04-07 DIAGNOSIS — M272 Inflammatory conditions of jaws: Secondary | ICD-10-CM

## 2024-04-12 ENCOUNTER — Ambulatory Visit (HOSPITAL_BASED_OUTPATIENT_CLINIC_OR_DEPARTMENT_OTHER)
Admit: 2024-04-12 | Discharge: 2024-04-12 | Disposition: A | Discharge status: Home / Self Care | Attending: Family Medicine | Admitting: Family Medicine

## 2024-04-12 DIAGNOSIS — M272 Inflammatory conditions of jaws: Secondary | ICD-10-CM | POA: Diagnosis not present

## 2024-04-12 DIAGNOSIS — S0301XA Dislocation of jaw, right side, initial encounter: Secondary | ICD-10-CM | POA: Diagnosis not present

## 2024-04-12 DIAGNOSIS — R220 Localized swelling, mass and lump, head: Secondary | ICD-10-CM | POA: Diagnosis not present

## 2024-04-12 DIAGNOSIS — I6523 Occlusion and stenosis of bilateral carotid arteries: Secondary | ICD-10-CM | POA: Diagnosis not present

## 2024-04-12 DIAGNOSIS — M542 Cervicalgia: Secondary | ICD-10-CM | POA: Diagnosis not present

## 2024-04-12 DIAGNOSIS — I700 Atherosclerosis of aorta: Secondary | ICD-10-CM | POA: Diagnosis not present

## 2024-04-12 MED ORDER — IOHEXOL 300 MG/ML  SOLN
100.0000 mL | Freq: Once | INTRAMUSCULAR | Status: AC | PRN
  Administered 2024-04-12: 75 mL via INTRAVENOUS

## 2024-04-13 ENCOUNTER — Ambulatory Visit (HOSPITAL_COMMUNITY): Payer: Self-pay | Admitting: Family Medicine

## 2024-04-13 DIAGNOSIS — R6884 Jaw pain: Secondary | ICD-10-CM | POA: Diagnosis not present

## 2024-04-15 ENCOUNTER — Other Ambulatory Visit (HOSPITAL_COMMUNITY): Payer: Self-pay | Admitting: Otolaryngology

## 2024-04-15 DIAGNOSIS — M272 Inflammatory conditions of jaws: Secondary | ICD-10-CM

## 2024-04-19 ENCOUNTER — Telehealth: Payer: Self-pay | Admitting: Pharmacy Technician

## 2024-04-19 NOTE — Telephone Encounter (Signed)
 Pharmacy Patient Advocate Encounter   Received notification from CoverMyMeds that prior authorization for NEXLETOL  is required/requested.   Insurance verification completed.   The patient is insured through [facility] .   Per test claim: PA required; PA submitted to above mentioned insurance via CoverMyMeds Key/confirmation #/EOC [id number] Status is pending     Pharmacy Patient Advocate Encounter  Received notification from [employer] that Prior Authorization for NEXLETOL  has been APPROVED from 02/18/24 to 10/19/24. Spoke to pharmacy to process.Copay is $75.00 for 90 days.    PA #/Case ID/Reference #: [id number]

## 2024-05-10 DIAGNOSIS — W3400XA Accidental discharge from unspecified firearms or gun, initial encounter: Secondary | ICD-10-CM | POA: Diagnosis not present

## 2024-05-10 DIAGNOSIS — S02651A Fracture of angle of right mandible, initial encounter for closed fracture: Secondary | ICD-10-CM | POA: Diagnosis not present

## 2024-05-20 ENCOUNTER — Ambulatory Visit (INDEPENDENT_AMBULATORY_CARE_PROVIDER_SITE_OTHER)
Admit: 2024-05-20 | Discharge: 2024-05-20 | Disposition: A | Discharge status: Home / Self Care | Attending: Family Medicine | Admitting: Family Medicine

## 2024-05-20 ENCOUNTER — Ambulatory Visit (HOSPITAL_BASED_OUTPATIENT_CLINIC_OR_DEPARTMENT_OTHER)
Admission: EM | Admit: 2024-05-20 | Discharge: 2024-05-20 | Disposition: A | Discharge status: Home / Self Care | Attending: Family Medicine | Admitting: Family Medicine

## 2024-05-20 ENCOUNTER — Encounter (HOSPITAL_BASED_OUTPATIENT_CLINIC_OR_DEPARTMENT_OTHER): Payer: Self-pay

## 2024-05-20 DIAGNOSIS — M25551 Pain in right hip: Secondary | ICD-10-CM | POA: Diagnosis not present

## 2024-05-20 MED ORDER — PREDNISONE 10 MG (21) PO TBPK: ORAL_TABLET | ORAL | 0 refills | Status: DC
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
## 2024-05-20 NOTE — ED Triage Notes (Signed)
 Right hip pain onset Thursday after getting up from chair at work. Denies radiation of pain down leg. No loss of bowel or bladder control. No numbness or tingling of leg.

## 2024-05-20 NOTE — Discharge Instructions (Signed)
 Your x ray was normal. Not sure if this may be some sort of nerve impingement or this could be a lower back problem. I am giving you prednisone  to see if this may help. You can try doing some stretching to the area. Ice/Heat. See orthopedics next week if problem persists.

## 2024-05-21 DIAGNOSIS — S02641A Fracture of ramus of right mandible, initial encounter for closed fracture: Secondary | ICD-10-CM | POA: Diagnosis not present

## 2024-05-21 DIAGNOSIS — W3400XA Accidental discharge from unspecified firearms or gun, initial encounter: Secondary | ICD-10-CM | POA: Diagnosis not present

## 2024-05-21 DIAGNOSIS — S0301XA Dislocation of jaw, right side, initial encounter: Secondary | ICD-10-CM | POA: Diagnosis not present

## 2024-06-08 DIAGNOSIS — W3400XA Accidental discharge from unspecified firearms or gun, initial encounter: Secondary | ICD-10-CM | POA: Diagnosis not present

## 2024-06-08 DIAGNOSIS — S02651A Fracture of angle of right mandible, initial encounter for closed fracture: Secondary | ICD-10-CM | POA: Diagnosis not present

## 2024-07-10 DIAGNOSIS — Z8781 Personal history of (healed) traumatic fracture: Secondary | ICD-10-CM | POA: Diagnosis not present

## 2024-07-10 DIAGNOSIS — M272 Inflammatory conditions of jaws: Secondary | ICD-10-CM | POA: Diagnosis not present

## 2024-07-14 DIAGNOSIS — M279 Disease of jaws, unspecified: Secondary | ICD-10-CM | POA: Diagnosis not present

## 2024-07-15 ENCOUNTER — Other Ambulatory Visit: Payer: Self-pay | Admitting: Cardiology

## 2024-07-26 DIAGNOSIS — I2510 Atherosclerotic heart disease of native coronary artery without angina pectoris: Secondary | ICD-10-CM | POA: Diagnosis not present

## 2024-07-26 DIAGNOSIS — M279 Disease of jaws, unspecified: Secondary | ICD-10-CM | POA: Diagnosis not present
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
## 2024-08-15 ENCOUNTER — Other Ambulatory Visit (HOSPITAL_COMMUNITY)
Admission: RE | Admit: 2024-08-15 | Discharge: 2024-08-15 | Disposition: A | Discharge status: Home / Self Care | Source: Ambulatory Visit | Attending: Infectious Disease | Admitting: Infectious Disease

## 2024-08-15 ENCOUNTER — Encounter: Payer: Self-pay | Admitting: Infectious Disease

## 2024-08-15 ENCOUNTER — Other Ambulatory Visit: Payer: Self-pay

## 2024-08-15 ENCOUNTER — Ambulatory Visit: Payer: 59 | Admitting: Infectious Disease

## 2024-08-15 ENCOUNTER — Other Ambulatory Visit: Payer: Self-pay | Admitting: Infectious Disease

## 2024-08-15 VITALS — Wt 225.8 lb

## 2024-08-15 DIAGNOSIS — I10 Essential (primary) hypertension: Secondary | ICD-10-CM | POA: Diagnosis not present

## 2024-08-15 DIAGNOSIS — E782 Mixed hyperlipidemia: Secondary | ICD-10-CM

## 2024-08-15 DIAGNOSIS — B20 Human immunodeficiency virus [HIV] disease: Secondary | ICD-10-CM

## 2024-08-15 DIAGNOSIS — M869 Osteomyelitis, unspecified: Secondary | ICD-10-CM | POA: Diagnosis not present

## 2024-08-15 DIAGNOSIS — M272 Inflammatory conditions of jaws: Secondary | ICD-10-CM | POA: Diagnosis not present

## 2024-08-15 DIAGNOSIS — I2510 Atherosclerotic heart disease of native coronary artery without angina pectoris: Secondary | ICD-10-CM | POA: Diagnosis not present

## 2024-08-15 DIAGNOSIS — Z113 Encounter for screening for infections with a predominantly sexual mode of transmission: Secondary | ICD-10-CM | POA: Diagnosis not present

## 2024-08-15 MED ORDER — BIKTARVY 50-200-25 MG PO TABS: 1.0000 | ORAL_TABLET | Freq: Every day | ORAL | 11 refills | Status: AC

## 2024-08-15 MED ORDER — ROSUVASTATIN CALCIUM 40 MG PO TABS: 40.0000 mg | ORAL_TABLET | Freq: Every day | ORAL | 11 refills | Status: DC

## 2024-08-16 LAB — URINE CYTOLOGY ANCILLARY ONLY
Chlamydia: NEGATIVE
Comment: NEGATIVE
Comment: NORMAL
Neisseria Gonorrhea: NEGATIVE

## 2024-08-16 LAB — T-HELPER CELLS (CD4) COUNT (NOT AT ARMC)
CD4 % Helper T Cell: 52 % (ref 33–65)
CD4 T Cell Abs: 787 /uL (ref 400–1790)

## 2024-08-18 ENCOUNTER — Encounter: Payer: Self-pay | Admitting: Infectious Disease

## 2024-08-18 LAB — HIV-1 RNA QUANT-NO REFLEX-BLD
HIV 1 RNA Quant: NOT DETECTED {copies}/mL
HIV-1 RNA Quant, Log: NOT DETECTED {Log_copies}/mL

## 2024-08-18 LAB — COMPLETE METABOLIC PANEL WITHOUT GFR
AG Ratio: 2 (calc) (ref 1.0–2.5)
ALT: 47 U/L — ABNORMAL HIGH (ref 9–46)
AST: 34 U/L (ref 10–35)
Albumin: 4.9 g/dL (ref 3.6–5.1)
Alkaline phosphatase (APISO): 57 U/L (ref 35–144)
BUN: 13 mg/dL (ref 7–25)
CO2: 25 mmol/L (ref 20–32)
Calcium: 10.3 mg/dL (ref 8.6–10.3)
Chloride: 101 mmol/L (ref 98–110)
Creat: 0.9 mg/dL (ref 0.70–1.30)
Globulin: 2.5 g/dL (ref 1.9–3.7)
Glucose, Bld: 95 mg/dL (ref 65–99)
Potassium: 4.9 mmol/L (ref 3.5–5.3)
Sodium: 136 mmol/L (ref 135–146)
Total Bilirubin: 0.6 mg/dL (ref 0.2–1.2)
Total Protein: 7.4 g/dL (ref 6.1–8.1)

## 2024-08-18 LAB — RPR: RPR Ser Ql: NONREACTIVE

## 2024-08-18 LAB — LIPID PANEL
Cholesterol: 160 mg/dL (ref <200)
HDL: 29 mg/dL — ABNORMAL LOW (ref >=40)
LDL Cholesterol (Calc): 92 mg/dL
Non-HDL Cholesterol (Calc): 131 mg/dL — ABNORMAL HIGH (ref <130)
Total CHOL/HDL Ratio: 5.5 (calc) — ABNORMAL HIGH (ref <5.0)
Triglycerides: 270 mg/dL — ABNORMAL HIGH (ref <150)

## 2024-08-18 LAB — CBC WITH DIFFERENTIAL/PLATELET
Absolute Lymphocytes: 1598 {cells}/uL (ref 850–3900)
Absolute Monocytes: 360 {cells}/uL (ref 200–950)
Basophils Absolute: 48 {cells}/uL (ref 0–200)
Basophils Relative: 1 %
Eosinophils Absolute: 168 {cells}/uL (ref 15–500)
Eosinophils Relative: 3.5 %
HCT: 46.9 % (ref 38.5–50.0)
Hemoglobin: 16 g/dL (ref 13.2–17.1)
MCH: 32.6 pg (ref 27.0–33.0)
MCHC: 34.1 g/dL (ref 32.0–36.0)
MCV: 95.5 fL (ref 80.0–100.0)
MPV: 10.4 fL (ref 7.5–12.5)
Monocytes Relative: 7.5 %
Neutro Abs: 2626 {cells}/uL (ref 1500–7800)
Neutrophils Relative %: 54.7 %
Platelets: 447 Thousand/uL — ABNORMAL HIGH (ref 140–400)
RBC: 4.91 Million/uL (ref 4.20–5.80)
RDW: 12.9 % (ref 11.0–15.0)
Total Lymphocyte: 33.3 %
WBC: 4.8 Thousand/uL (ref 3.8–10.8)

## 2024-09-28 ENCOUNTER — Other Ambulatory Visit: Payer: Self-pay

## 2024-10-01 NOTE — Progress Notes (Deleted)
 Cardiology Office Note:    Date:  10/01/2024   ID:  Jimmy [name], DOB 10/07/1966, MRN [id number]  PCP:  Gayl Males, MD  Cardiologist:  Redell Leiter, MD    Referring MD: Gayl Males, MD    ASSESSMENT:    1. Coronary artery disease involving native coronary artery of native heart without angina pectoris   2. Agatston coronary artery calcium  score greater than 400   3. Ascending aorta enlargement   4. Essential hypertension   5. Mixed hyperlipidemia    PLAN:    In order of problems listed above:  ***   Next appointment: ***   Medication Adjustments/Labs and Tests Ordered: Current medicines are reviewed at length with the patient today.  Concerns regarding medicines are outlined above.  No orders of the defined types were placed in this encounter.  No orders of the defined types were placed in this encounter.    History of Present Illness:    Jimmy [name] is a 58 y.o. 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  He had chest CTA performed 07/26/2024 commented on atherosclerosis coronary arteries thoracic aorta but no enlargement was described.  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      EKGs/Labs/Other Studies Reviewed:    The following studies were reviewed today:  Cardiac Studies & Procedures   ______________________________________________________________________________________________     ECHOCARDIOGRAM  ECHOCARDIOGRAM COMPLETE 07/12/2020  Narrative ECHOCARDIOGRAM REPORT    Patient Name:   Jimmy [name] Date of Exam: 07/12/2020 Medical Rec #:  [id number]       Height:       71.0 in Accession #:    [id number]      Weight:       208.8 lb Date of Birth:  Aug 26, 1966       BSA:          2.148 m Patient Age:    58 years        BP:           126/74 mmHg Patient Gender: M               HR:           86 bpm. Exam Location:  [place]  Procedure: 2D Echo  Indications:    Shortness of breath [R06.02 (ICD-10-CM)];  History:        Patient has no prior history of Echocardiogram examinations. Signs/Symptoms:Chest Pain; Risk Factors:Hypertension and Dyslipidemia.  Sonographer:  Lynwood Reel Referring Phys: [id number] Ravneet Spilker J Laportia Carley  IMPRESSIONS   1. Left ventricular ejection fraction, by estimation, is 60 to 65%. The left ventricle has normal function. The left ventricle has no regional wall motion abnormalities. Left ventricular diastolic parameters are consistent with Grade I diastolic dysfunction (impaired relaxation). 2. Right ventricular systolic function is normal. The right ventricular size is normal. 3. The mitral valve is normal in structure. No evidence of mitral valve regurgitation. No evidence of mitral stenosis. 4. The aortic valve is tricuspid. Aortic valve regurgitation is not visualized. No aortic stenosis is present. 5. The inferior vena cava is normal in size with greater than 50% respiratory variability,  suggesting right atrial pressure of 3 mmHg.  FINDINGS Left Ventricle: Left ventricular ejection fraction, by estimation, is 60 to 65%. The left ventricle has normal function. The left ventricle has no regional wall motion abnormalities. The left ventricular internal cavity size was normal in size. There is no left ventricular hypertrophy. Left ventricular diastolic parameters are consistent with Grade I diastolic dysfunction (impaired relaxation). Normal left ventricular filling pressure.  Right Ventricle: The right ventricular size is normal. No increase in right ventricular wall thickness. Right ventricular systolic function is normal.  Left Atrium: Left atrial size was normal in size.  Right Atrium: Right atrial size was normal in size.  Pericardium: There is no evidence of pericardial effusion.  Mitral Valve: The mitral valve is normal in structure. Normal mobility of the mitral valve leaflets. No evidence of mitral valve regurgitation. No evidence of mitral valve stenosis.  Tricuspid Valve: The tricuspid valve is normal in structure. Tricuspid valve regurgitation is not demonstrated. No evidence of tricuspid stenosis.  Aortic Valve: The aortic valve is tricuspid. Aortic valve regurgitation is not visualized. No aortic stenosis is present.  Pulmonic Valve: The pulmonic valve was normal in structure. Pulmonic valve regurgitation is not visualized. No evidence of pulmonic stenosis.  Aorta: The aortic root and ascending aorta are structurally normal, with no evidence of dilitation.  Venous: The pulmonary veins were not well visualized. The inferior vena cava is normal in size with greater than 50% respiratory variability, suggesting right atrial pressure of 3 mmHg.  IAS/Shunts: No atrial level shunt detected by color flow Doppler.   LEFT VENTRICLE PLAX 2D LVIDd:         3.70 cm  Diastology LVIDs:         2.75 cm  LV e' lateral:   9.68 cm/s LV PW:         1.20 cm  LV E/e' lateral:  7.8 LV IVS:        1.10 cm  LV e' medial:    6.09 cm/s LVOT diam:     2.40 cm  LV E/e' medial:  12.4 LV SV:         95 LV SV Index:   44 LVOT Area:     4.52 cm   RIGHT VENTRICLE             IVC RV S prime:     13.40 cm/s  IVC diam: 1.70 cm TAPSE (M-mode): 2.7 cm  LEFT ATRIUM             Index       RIGHT ATRIUM           Index LA diam:        3.90 cm 1.82 cm/m  RA Area:     15.40 cm LA Vol (A2C):   42.3 ml 19.70 ml/m  RA Volume:   39.80 ml  18.53 ml/m LA Vol (A4C):   54.4 ml 25.33 ml/m LA Biplane Vol: 47.9 ml 22.30 ml/m AORTIC VALVE LVOT Vmax:   114.00 cm/s LVOT Vmean:  76.100 cm/s LVOT VTI:    0.209 m  AORTA Ao Root diam: 3.50 cm Ao Asc diam:  3.50 cm  MITRAL VALVE MV Area (PHT): 4.60 cm     SHUNTS MV Decel Time: 165 msec     Systemic VTI:  0.21 m MV E velocity: 75.70 cm/s   Systemic Diam: 2.40 cm MV A velocity: 100.00 cm/s MV E/A ratio:  0.76  Redell Leiter MD Electronically signed by Redell Leiter MD Signature Date/Time: 07/12/2020/4:41:16 PM    Final      CT SCANS  CT CORONARY FRACTIONAL FLOW RESERVE DATA PREP 07/18/2020  Narrative EXAM: FFRCT ANALYSIS for abnormal CTA of coronary arteries  FINDINGS: FFRct analysis was performed on the original cardiac CT angiogram dataset. Diagrammatic representation of the FFRct analysis is provided in a separate PDF document in PACS. This dictation was created using the PDF document and an interactive 3D model of the results. 3D model is not available in the EMR/PACS. Normal FFR range is >0.80.  1. Left Main: 0.98  2. LAD: Proximal - 0.98, distal - 0.85  3. LCX: Proximal 0.98, distal OM1 - 0.93, distal OM2 - 0.89  4. Ramus: N/A  5. RCA: Proximal - 0.94, distal PDA - 0.76 (vessel to small in caliber for PCI)  IMPRESSION: 1. Overall reassuring FFR analysis. Distal PDA is 0.76 but vessel is too small in caliber for PCI.  2.  Recommend aggressive medical management.  Oneil Parchment, MD, [facility]  Note: These  examples are not recommendations of [employer] and only provided as examples of what other customers are doing.   Electronically Signed By: Oneil Parchment MD On: 07/18/2020 16:57   CT SCANS  CT CORONARY MORPH W/CTA COR W/SCORE 07/17/2020  Addendum 07/18/2020  9:13 AM ADDENDUM REPORT: 07/18/2020 09:10  CLINICAL DATA:  58 year old with shortness of breath, HTN, chest pain  EXAM: Cardiac/Coronary  CTA  TECHNIQUE: The patient was scanned on a [employer].  FINDINGS: A 120 kV prospective scan was triggered in the descending thoracic aorta at 111 HU's. Axial non-contrast 3 mm slices were carried out through the heart. The data set was analyzed on a dedicated work station and scored using the Agatson method. Gantry rotation speed was 250 msecs and collimation was .6 mm. Beta blockade and 0.8 mg of sl NTG was given. The 3D data set was reconstructed in 5% intervals of the 67-82 % of the R-R cycle. Diastolic phases were analyzed on a dedicated work station using MPR, MIP and VRT modes. The patient received 80 cc of contrast.  Aorta: Mildly dilated. 41 mm at sinus of Valsalva, 34 mm ascending aorta, 28 mm descending. Diffuse atherosclerosis, with descending grade 4 complex atheroma. No dissection.  Aortic Valve:  Trileaflet.  No calcifications.  Coronary Arteries:  Normal coronary origin.  Right dominance.  RCA is a large dominant artery that gives rise to PDA and PLA. There is proximal soft plaque of 0-24% followed by focal calcified plaque, 0-24%. There is calcified plaque at the bifurcation of PDA with possible ostial PDA stenosis of 50%. Sending for FFR.  Left main is a large artery that gives rise to LAD and LCX arteries. There is calcified plaque, 0-24% stenosis, non flow limiting.  LAD is a large vessel that has  mixed calcified and non calcified plaque, possible 50% proximally. Will send for FFR. There is a large first diagonal with proximal calcified plaque  0-24%. Focal calcified plaque in mid LAD, 25-49% stenosis at the bifurcation of second diagonal.  LCX is a non-dominant artery that gives rise to one large OM1 branch. There is scattered calcified and non calcified plaque 0-24%.  Other findings:  Normal pulmonary vein drainage into the left atrium.  Normal left atrial appendage without a thrombus.  Normal size of the pulmonary artery.  Please see radiology report for non cardiac findings.  IMPRESSION: 1. Coronary calcium  score of 516. This was 37 percentile for age and sex matched control.  2. Normal coronary origin with right dominance.  3. 50% stenosis of LAD proximally with scattered mixed plaque. Possible 50% stenosis of ostium of PDA. 0-24% stenosis scattered calcified plaque of circumflex. Sending for FFR analysis.  4. Aorta: Mildly dilated. 41 mm at sinus of Valsalva, 34 mm ascending aorta, 28 mm descending. Diffuse atherosclerosis, with descending grade 4 complex atheroma.  5. CAD-RADS 3. Moderate stenosis. Consider symptom-guided anti-ischemic pharmacotherapy as well as risk factor modification per guideline directed care. Additional analysis with CT FFR will be submitted.  Oneil Parchment, MD [facility]   Electronically Signed By: Oneil Parchment MD On: 07/18/2020 09:10  Narrative EXAM: OVER-READ INTERPRETATION  CT CHEST  The following report is an over-read performed by radiologist Dr. Rockey Kilts of [facility] on 07/17/2020. This over-read does not include interpretation of cardiac or coronary anatomy or pathology. The coronary CTA interpretation by the cardiologist is attached.  COMPARISON:  Chest radiograph 08/17/2017 from [facility].  FINDINGS: Vascular: Aortic atherosclerosis. No central pulmonary embolism, on this non-dedicated study.  Mediastinum/Nodes: No imaged thoracic adenopathy.  Lungs/Pleura: No pleural fluid.  Clear imaged lungs.  Upper Abdomen: Normal imaged portions of  the liver, spleen, stomach  Musculoskeletal: No acute osseous abnormality.  IMPRESSION: 1.  No acute findings in the imaged extracardiac chest. 2.  Aortic Atherosclerosis (ICD10-I70.0).  Electronically Signed: By: Rockey Kilts M.D. On: 07/17/2020 16:48     ______________________________________________________________________________________________          Recent Labs: 08/15/2024: ALT 47; BUN 13; Creat 0.90; Hemoglobin 16.0; Platelets 447; Potassium 4.9; Sodium 136  Recent Lipid Panel    Component Value Date/Time   CHOL 160 08/15/2024 0957   CHOL 105 09/05/2020 1603   TRIG 270 (H) 08/15/2024 0957   HDL 29 (L) 08/15/2024 0957   HDL 34 (L) 09/05/2020 1603   CHOLHDL 5.5 (H) 08/15/2024 0957   LDLCALC 92 08/15/2024 0957    Physical Exam:    VS:  There were no vitals taken for this visit.    Wt Readings from Last 3 Encounters:  08/15/24 225 lb 12.8 oz (102.4 kg)  10/12/23 219 lb (99.3 kg)  09/09/23 217 lb 3.2 oz (98.5 kg)     GEN: *** Well nourished, well developed in no acute distress HEENT: Normal NECK: No JVD; No carotid bruits LYMPHATICS: No lymphadenopathy CARDIAC: ***RRR, no murmurs, rubs, gallops RESPIRATORY:  Clear to auscultation without rales, wheezing or rhonchi  ABDOMEN: Soft, non-tender, non-distended MUSCULOSKELETAL:  No edema; No deformity  SKIN: Warm and dry NEUROLOGIC:  Alert and oriented x 3 PSYCHIATRIC:  Normal affect    Signed, Redell Leiter, MD  10/01/2024 8:50 AM    [facility]

## 2024-10-02 ENCOUNTER — Ambulatory Visit: Admitting: Cardiology

## 2024-10-02 DIAGNOSIS — R931 Abnormal findings on diagnostic imaging of heart and coronary circulation: Secondary | ICD-10-CM

## 2024-10-02 DIAGNOSIS — E782 Mixed hyperlipidemia: Secondary | ICD-10-CM

## 2024-10-02 DIAGNOSIS — I10 Essential (primary) hypertension: Secondary | ICD-10-CM

## 2024-10-02 DIAGNOSIS — I7789 Other specified disorders of arteries and arterioles: Secondary | ICD-10-CM

## 2024-10-02 DIAGNOSIS — I2510 Atherosclerotic heart disease of native coronary artery without angina pectoris: Secondary | ICD-10-CM

## 2024-10-15 ENCOUNTER — Other Ambulatory Visit: Payer: Self-pay | Admitting: Cardiology

## 2024-10-21 ENCOUNTER — Other Ambulatory Visit: Payer: Self-pay | Admitting: Cardiology

## 2024-10-26 ENCOUNTER — Encounter: Payer: Self-pay | Admitting: Cardiology

## 2024-10-26 ENCOUNTER — Other Ambulatory Visit: Payer: Self-pay

## 2024-10-29 ENCOUNTER — Other Ambulatory Visit: Payer: Self-pay | Admitting: Infectious Disease

## 2024-10-30 ENCOUNTER — Other Ambulatory Visit (HOSPITAL_COMMUNITY): Payer: Self-pay

## 2024-10-30 ENCOUNTER — Telehealth: Payer: Self-pay | Admitting: Pharmacy Technician

## 2024-10-30 MED ORDER — NEXLETOL 180 MG PO TABS: 1.0000 | ORAL_TABLET | Freq: Every day | ORAL | 0 refills | Status: AC

## 2024-10-30 NOTE — Telephone Encounter (Signed)
 Pharmacy Patient Advocate Encounter   Received notification from CoverMyMeds that prior authorization for nexletol  is required/requested.   Insurance verification completed.   The patient is insured through [facility].   Per test claim: PA required; PA submitted to above mentioned insurance via Latent Key/confirmation #/EOC [id number] Status is pending     Pharmacy Patient Advocate Encounter  Received notification from [facility] that Prior Authorization for nexletol  has been APPROVED from 10/30/24 to 10/29/25   PA #/Case ID/Reference #: [id number]

## 2024-11-01 ENCOUNTER — Encounter: Payer: Self-pay | Admitting: Infectious Disease

## 2024-11-01 ENCOUNTER — Other Ambulatory Visit (HOSPITAL_COMMUNITY): Payer: Self-pay

## 2024-11-21 NOTE — Progress Notes (Unsigned)
 " Cardiology Office Note:    Date:  11/22/2024   ID:  Jimmy [name], DOB 08-Jan-1966, MRN [id number]  PCP:  Gayl Males, MD  Cardiologist:  Redell Leiter, MD    Referring MD: Gayl Males, MD    ASSESSMENT:    1. Coronary artery disease involving native coronary artery of native heart without angina pectoris   2. Agatston coronary artery calcium  score greater than 400   3. Ascending aorta enlargement   4. Essential hypertension   5. Mixed hyperlipidemia    PLAN:    In order of problems listed above:  Siddh is doing well with CAD no anginal discomfort continue diltiazem  bempedoic acid  and he will consider discussed with his wife restarting aspirin  Continue his lipid-lowering treatment I would not intensify at this time Blood pressure well-controlled continue calcium  channel blocker Will discuss repeat CT thoracic aorta next visit.   Next appointment: 1 year   Medication Adjustments/Labs and Tests Ordered: Current medicines are reviewed at length with the patient today.  Concerns regarding medicines are outlined above.  Orders Placed This Encounter  Procedures   EKG 12-Lead   No orders of the defined types were placed in this encounter.    History of Present Illness:    [name] is a 59 y.o. male with a hx of severely elevated coronary calcium  score 560/97 percentile 50% stenosis in the LAD and ostial PDA mild enlargement thoracic aorta 41 mm hypertension and hyperlipidemia last seen 09/09/2023.  He had head and neck CT done in May which showed atherosclerosis at the carotid bifurcation bilaterally.  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 Fortunately cardiac he is doing well no angina dyspnea palpitation or syncope I did ask him to consider going back on coated aspirin  81 mg daily he is going to discuss with his wife and decide  whether to restart He tolerates his lipid-lowering therapy without muscle pain or weakness lipid profile 08/15/2024 cholesterol 160 LDL 92 Past Medical History:  Diagnosis Date   ABLA (acute blood loss anemia) 03/26/2022   Acid reflux    Acute deep vein thrombosis (DVT) of radial vein of right upper extremity (HCC)    Acute respiratory failure with hypoxia (HCC) 03/03/2022   CAD (coronary artery disease) 10/11/2023   Duodenal ulcer    Exposed orthopaedic hardware 11/20/2022   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   GI bleed 03/26/2022   HIV disease (HCC) 11/04/2021   HIV positive (HCC) 10/17/2015   Hyperlipidemia    Hypertension    Iron deficiency anemia, unspecified 05/01/2022   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   Osteomyelitis of mandible 06/24/2022   Penicillin allergy 06/24/2022    Current Medications: Active Medications[1]    EKGs/Labs/Other Studies Reviewed:    The following studies were reviewed today:  Cardiac Studies & Procedures   ______________________________________________________________________________________________     ECHOCARDIOGRAM  ECHOCARDIOGRAM COMPLETE 07/12/2020  Narrative ECHOCARDIOGRAM REPORT    Patient Name:   Jimmy [name] Date of Exam: 07/12/2020 Medical Rec #:  [id number]       Height:       71.0 in Accession #:    [id number]      Weight:       208.8 lb Date of Birth:  1966/05/23       BSA:          2.148 m Patient Age:  59 years        BP:           126/74 mmHg Patient Gender: M               HR:           86 bpm. Exam Location:  [place]  Procedure: 2D Echo  Indications:    Shortness of breath [R06.02 (ICD-10-CM)];  History:        Patient has no prior history of Echocardiogram examinations. Signs/Symptoms:Chest Pain; Risk Factors:Hypertension and Dyslipidemia.  Sonographer:    Lynwood Silvas Referring Phys: [phone] Azaliah Carrero J Vaudie Engebretsen  IMPRESSIONS   1. Left ventricular ejection fraction, by estimation, is 60 to 65%. The left  ventricle has normal function. The left ventricle has no regional wall motion abnormalities. Left ventricular diastolic parameters are consistent with Grade I diastolic dysfunction (impaired relaxation). 2. Right ventricular systolic function is normal. The right ventricular size is normal. 3. The mitral valve is normal in structure. No evidence of mitral valve regurgitation. No evidence of mitral stenosis. 4. The aortic valve is tricuspid. Aortic valve regurgitation is not visualized. No aortic stenosis is present. 5. The inferior vena cava is normal in size with greater than 50% respiratory variability, suggesting right atrial pressure of 3 mmHg.  FINDINGS Left Ventricle: Left ventricular ejection fraction, by estimation, is 60 to 65%. The left ventricle has normal function. The left ventricle has no regional wall motion abnormalities. The left ventricular internal cavity size was normal in size. There is no left ventricular hypertrophy. Left ventricular diastolic parameters are consistent with Grade I diastolic dysfunction (impaired relaxation). Normal left ventricular filling pressure.  Right Ventricle: The right ventricular size is normal. No increase in right ventricular wall thickness. Right ventricular systolic function is normal.  Left Atrium: Left atrial size was normal in size.  Right Atrium: Right atrial size was normal in size.  Pericardium: There is no evidence of pericardial effusion.  Mitral Valve: The mitral valve is normal in structure. Normal mobility of the mitral valve leaflets. No evidence of mitral valve regurgitation. No evidence of mitral valve stenosis.  Tricuspid Valve: The tricuspid valve is normal in structure. Tricuspid valve regurgitation is not demonstrated. No evidence of tricuspid stenosis.  Aortic Valve: The aortic valve is tricuspid. Aortic valve regurgitation is not visualized. No aortic stenosis is present.  Pulmonic Valve: The pulmonic valve was normal  in structure. Pulmonic valve regurgitation is not visualized. No evidence of pulmonic stenosis.  Aorta: The aortic root and ascending aorta are structurally normal, with no evidence of dilitation.  Venous: The pulmonary veins were not well visualized. The inferior vena cava is normal in size with greater than 50% respiratory variability, suggesting right atrial pressure of 3 mmHg.  IAS/Shunts: No atrial level shunt detected by color flow Doppler.   LEFT VENTRICLE PLAX 2D LVIDd:         3.70 cm  Diastology LVIDs:         2.75 cm  LV e' lateral:   9.68 cm/s LV PW:         1.20 cm  LV E/e' lateral: 7.8 LV IVS:        1.10 cm  LV e' medial:    6.09 cm/s LVOT diam:     2.40 cm  LV E/e' medial:  12.4 LV SV:         95 LV SV Index:   44 LVOT Area:     4.52 cm   RIGHT  VENTRICLE             IVC RV S prime:     13.40 cm/s  IVC diam: 1.70 cm TAPSE (M-mode): 2.7 cm  LEFT ATRIUM             Index       RIGHT ATRIUM           Index LA diam:        3.90 cm 1.82 cm/m  RA Area:     15.40 cm LA Vol (A2C):   42.3 ml 19.70 ml/m RA Volume:   39.80 ml  18.53 ml/m LA Vol (A4C):   54.4 ml 25.33 ml/m LA Biplane Vol: 47.9 ml 22.30 ml/m AORTIC VALVE LVOT Vmax:   114.00 cm/s LVOT Vmean:  76.100 cm/s LVOT VTI:    0.209 m  AORTA Ao Root diam: 3.50 cm Ao Asc diam:  3.50 cm  MITRAL VALVE MV Area (PHT): 4.60 cm     SHUNTS MV Decel Time: 165 msec     Systemic VTI:  0.21 m MV E velocity: 75.70 cm/s   Systemic Diam: 2.40 cm MV A velocity: 100.00 cm/s MV E/A ratio:  0.76  Redell Leiter MD Electronically signed by Redell Leiter MD Signature Date/Time: 07/12/2020/4:41:16 PM    Final      CT SCANS  CT CORONARY FRACTIONAL FLOW RESERVE DATA PREP 07/18/2020  Narrative EXAM: FFRCT ANALYSIS for abnormal CTA of coronary arteries  FINDINGS: FFRct analysis was performed on the original cardiac CT angiogram dataset. Diagrammatic representation of the FFRct analysis is provided in a separate PDF  document in PACS. This dictation was created using the PDF document and an interactive 3D model of the results. 3D model is not available in the EMR/PACS. Normal FFR range is >0.80.  1. Left Main: 0.98  2. LAD: Proximal - 0.98, distal - 0.85  3. LCX: Proximal 0.98, distal OM1 - 0.93, distal OM2 - 0.89  4. Ramus: N/A  5. RCA: Proximal - 0.94, distal PDA - 0.76 (vessel to small in caliber for PCI)  IMPRESSION: 1. Overall reassuring FFR analysis. Distal PDA is 0.76 but vessel is too small in caliber for PCI.  2.  Recommend aggressive medical management.  Oneil Parchment, MD, [facility]  Note: These examples are not recommendations of [employer] and only provided as examples of what other customers are doing.   Electronically Signed By: Oneil Parchment MD On: 07/18/2020 16:57   CT SCANS  CT CORONARY MORPH W/CTA COR W/SCORE 07/17/2020  Addendum 07/18/2020  9:13 AM ADDENDUM REPORT: 07/18/2020 09:10  CLINICAL DATA:  59 year old with shortness of breath, HTN, chest pain  EXAM: Cardiac/Coronary  CTA  TECHNIQUE: The patient was scanned on a [employer].  FINDINGS: A 120 kV prospective scan was triggered in the descending thoracic aorta at 111 HU's. Axial non-contrast 3 mm slices were carried out through the heart. The data set was analyzed on a dedicated work station and scored using the Agatson method. Gantry rotation speed was 250 msecs and collimation was .6 mm. Beta blockade and 0.8 mg of sl NTG was given. The 3D data set was reconstructed in 5% intervals of the 67-82 % of the R-R cycle. Diastolic phases were analyzed on a dedicated work station using MPR, MIP and VRT modes. The patient received 80 cc of contrast.  Aorta: Mildly dilated. 41 mm at sinus of Valsalva, 34 mm ascending aorta, 28 mm descending. Diffuse atherosclerosis, with descending grade 4 complex atheroma.  No dissection.  Aortic Valve:  Trileaflet.  No calcifications.  Coronary Arteries:  Normal  coronary origin.  Right dominance.  RCA is a large dominant artery that gives rise to PDA and PLA. There is proximal soft plaque of 0-24% followed by focal calcified plaque, 0-24%. There is calcified plaque at the bifurcation of PDA with possible ostial PDA stenosis of 50%. Sending for FFR.  Left main is a large artery that gives rise to LAD and LCX arteries. There is calcified plaque, 0-24% stenosis, non flow limiting.  LAD is a large vessel that has mixed calcified and non calcified plaque, possible 50% proximally. Will send for FFR. There is a large first diagonal with proximal calcified plaque 0-24%. Focal calcified plaque in mid LAD, 25-49% stenosis at the bifurcation of second diagonal.  LCX is a non-dominant artery that gives rise to one large OM1 branch. There is scattered calcified and non calcified plaque 0-24%.  Other findings:  Normal pulmonary vein drainage into the left atrium.  Normal left atrial appendage without a thrombus.  Normal size of the pulmonary artery.  Please see radiology report for non cardiac findings.  IMPRESSION: 1. Coronary calcium  score of 516. This was 46 percentile for age and sex matched control.  2. Normal coronary origin with right dominance.  3. 50% stenosis of LAD proximally with scattered mixed plaque. Possible 50% stenosis of ostium of PDA. 0-24% stenosis scattered calcified plaque of circumflex. Sending for FFR analysis.  4. Aorta: Mildly dilated. 41 mm at sinus of Valsalva, 34 mm ascending aorta, 28 mm descending. Diffuse atherosclerosis, with descending grade 4 complex atheroma.  5. CAD-RADS 3. Moderate stenosis. Consider symptom-guided anti-ischemic pharmacotherapy as well as risk factor modification per guideline directed care. Additional analysis with CT FFR will be submitted.  Oneil Parchment, MD [facility]   Electronically Signed By: Oneil Parchment MD On: 07/18/2020 09:10  Narrative EXAM: OVER-READ INTERPRETATION  CT  CHEST  The following report is an over-read performed by radiologist Dr. Rockey Kilts of [facility] on 07/17/2020. This over-read does not include interpretation of cardiac or coronary anatomy or pathology. The coronary CTA interpretation by the cardiologist is attached.  COMPARISON:  Chest radiograph 08/17/2017 from [facility].  FINDINGS: Vascular: Aortic atherosclerosis. No central pulmonary embolism, on this non-dedicated study.  Mediastinum/Nodes: No imaged thoracic adenopathy.  Lungs/Pleura: No pleural fluid.  Clear imaged lungs.  Upper Abdomen: Normal imaged portions of the liver, spleen, stomach  Musculoskeletal: No acute osseous abnormality.  IMPRESSION: 1.  No acute findings in the imaged extracardiac chest. 2.  Aortic Atherosclerosis (ICD10-I70.0).  Electronically Signed: By: Rockey Kilts M.D. On: 07/17/2020 16:48     ______________________________________________________________________________________________      EKG Interpretation Date/Time:  Wednesday November 22 2024 14:09:33 EST Ventricular Rate:  79 PR Interval:  204 QRS Duration:  72 QT Interval:  356 QTC Calculation: 408 R Axis:   -34  Text Interpretation: Normal sinus rhythm ) Left axis deviation No previous ECGs available Confirmed by Monetta Rogue ([id number]) on 11/22/2024 2:17:22 PM   Recent Labs: 08/15/2024: ALT 47; BUN 13; Creat 0.90; Hemoglobin 16.0; Platelets 447; Potassium 4.9; Sodium 136  Recent Lipid Panel    Component Value Date/Time   CHOL 160 08/15/2024 0957   CHOL 105 09/05/2020 1603   TRIG 270 (H) 08/15/2024 0957   HDL 29 (L) 08/15/2024 0957   HDL 34 (L) 09/05/2020 1603   CHOLHDL 5.5 (H) 08/15/2024 0957   LDLCALC 92 08/15/2024 0957    Physical  Exam:    VS:  BP 128/78   Pulse 79   Ht 5' 11 (1.803 m)   Wt 227 lb (103 kg)   SpO2 93%   BMI 31.66 kg/m     Wt Readings from Last 3 Encounters:  11/22/24 227 lb (103 kg)  08/15/24 225 lb 12.8 oz (102.4 kg)   10/12/23 219 lb (99.3 kg)     GEN:  Well nourished, well developed in no acute distress HEENT: Normal NECK: No JVD; No carotid bruits LYMPHATICS: No lymphadenopathy CARDIAC: RRR, no murmurs, rubs, gallops RESPIRATORY:  Clear to auscultation without rales, wheezing or rhonchi  ABDOMEN: Soft, non-tender, non-distended MUSCULOSKELETAL:  No edema; No deformity  SKIN: Warm and dry NEUROLOGIC:  Alert and oriented x 3 PSYCHIATRIC:  Normal affect    Signed, Redell Leiter, MD  11/22/2024 2:18 PM    [facility]      [1]  Current Meds  Medication Sig   Bempedoic Acid  (NEXLETOL ) 180 MG TABS Take 1 tablet (180 mg total) by mouth daily.   bictegravir-emtricitabine-tenofovir AF (BIKTARVY ) 50-200-25 MG TABS tablet Take 1 tablet by mouth daily.   diltiazem  (CARDIZEM  CD) 240 MG 24 hr capsule TAKE 1 CAPSULE BY MOUTH EVERY DAY   NICOTINE MINI 4 MG lozenge SMARTSIG:1 Lozenge(s) By Mouth Every 2-4 Hours   nitroGLYCERIN  (NITROSTAT ) 0.4 MG SL tablet Place 1 tablet (0.4 mg total) under the tongue every 5 (five) minutes as needed for chest pain.   rosuvastatin  (CRESTOR ) 40 MG tablet Take 1 tablet (40 mg total) by mouth daily.   "

## 2024-11-22 ENCOUNTER — Ambulatory Visit: Admitting: Cardiology

## 2024-11-22 ENCOUNTER — Encounter: Payer: Self-pay | Admitting: Cardiology

## 2024-11-22 VITALS — BP 128/78 | HR 79 | Ht 71.0 in | Wt 227.0 lb

## 2024-11-22 DIAGNOSIS — I10 Essential (primary) hypertension: Secondary | ICD-10-CM

## 2024-11-22 DIAGNOSIS — I2510 Atherosclerotic heart disease of native coronary artery without angina pectoris: Secondary | ICD-10-CM | POA: Diagnosis not present

## 2024-11-22 DIAGNOSIS — E782 Mixed hyperlipidemia: Secondary | ICD-10-CM | POA: Diagnosis not present

## 2024-11-22 DIAGNOSIS — I7789 Other specified disorders of arteries and arterioles: Secondary | ICD-10-CM

## 2024-11-22 DIAGNOSIS — R931 Abnormal findings on diagnostic imaging of heart and coronary circulation: Secondary | ICD-10-CM

## 2024-11-22 MED ORDER — ASPIRIN 81 MG PO TBEC: 81.0000 mg | DELAYED_RELEASE_TABLET | Freq: Every day | ORAL | Status: AC

## 2024-11-22 NOTE — Patient Instructions (Signed)
 Medication Instructions:  Your physician has recommended you make the following change in your medication:   Restart Aspirin  81 mg daily  *If you need a refill on your cardiac medications before your next appointment, please call your pharmacy*   Lab Work: None ordered If you have labs (blood work) drawn today and your tests are completely normal, you will receive your results only by: MyChart Message (if you have MyChart) OR A paper copy in the mail If you have any lab test that is abnormal or we need to change your treatment, we will call you to review the results.   Testing/Procedures: None ordered   Follow-Up: At [facility], you and your health needs are our priority.  As part of our continuing mission to provide you with exceptional heart care, we have created designated Provider Care Teams.  These Care Teams include your primary Cardiologist (physician) and Advanced Practice Providers (APPs -  Physician Assistants and Nurse Practitioners) who all work together to provide you with the care you need, when you need it.  We recommend signing up for the patient portal called MyChart.  Sign up information is provided on this After Visit Summary.  MyChart is used to connect with patients for Virtual Visits (Telemedicine).  Patients are able to view lab/test results, encounter notes, upcoming appointments, etc.  Non-urgent messages can be sent to your provider as well.   To learn more about what you can do with MyChart, go to [email or web address].    Your next appointment:   1 year(s)  The format for your next appointment:   In Person  Provider:   Redell Leiter, MD    Other Instructions none  Important Information About Sugar

## 2025-06-13 ENCOUNTER — Ambulatory Visit: Admitting: Infectious Disease
